# Patient Record
Sex: Male | Born: 1960
Health system: Southern US, Community
[De-identification: ages and names within clinical notes are randomized; demographics above are authoritative.]

## PROBLEM LIST (undated history)

## (undated) ENCOUNTER — Ambulatory Visit

## (undated) DIAGNOSIS — M1990 Unspecified osteoarthritis, unspecified site: Secondary | ICD-10-CM

## (undated) DIAGNOSIS — E785 Hyperlipidemia, unspecified: Secondary | ICD-10-CM

## (undated) DIAGNOSIS — I2510 Atherosclerotic heart disease of native coronary artery without angina pectoris: Secondary | ICD-10-CM

## (undated) DIAGNOSIS — E079 Disorder of thyroid, unspecified: Secondary | ICD-10-CM

## (undated) DIAGNOSIS — C439 Malignant melanoma of skin, unspecified: Secondary | ICD-10-CM

## (undated) DIAGNOSIS — I10 Essential (primary) hypertension: Secondary | ICD-10-CM

## (undated) DIAGNOSIS — E119 Type 2 diabetes mellitus without complications: Secondary | ICD-10-CM

## (undated) HISTORY — PX: KNEE SURGERY: SHX244

## (undated) HISTORY — DX: Type 2 diabetes mellitus without complications: E11.9

## (undated) HISTORY — DX: Unspecified osteoarthritis, unspecified site: M19.90

## (undated) HISTORY — DX: Atherosclerotic heart disease of native coronary artery without angina pectoris: I25.10

## (undated) HISTORY — DX: Hyperlipidemia, unspecified: E78.5

## (undated) HISTORY — DX: Malignant melanoma of skin, unspecified: C43.9

## (undated) HISTORY — DX: Disorder of thyroid, unspecified: E07.9

## (undated) HISTORY — PX: FRACTURE SURGERY: SHX138

---

## 2002-01-10 ENCOUNTER — Encounter: Admission: RE | Admit: 2002-01-10 | Discharge: 2002-04-10 | Payer: Self-pay | Admitting: Family Medicine

## 2005-02-27 ENCOUNTER — Ambulatory Visit: Payer: Self-pay | Admitting: Family Medicine

## 2005-04-04 ENCOUNTER — Ambulatory Visit: Payer: Self-pay | Admitting: Family Medicine

## 2005-04-08 ENCOUNTER — Ambulatory Visit: Payer: Self-pay | Admitting: Internal Medicine

## 2005-04-21 ENCOUNTER — Ambulatory Visit: Payer: Self-pay | Admitting: Family Medicine

## 2005-05-16 ENCOUNTER — Ambulatory Visit: Payer: Self-pay | Admitting: Family Medicine

## 2005-05-26 ENCOUNTER — Ambulatory Visit: Payer: Self-pay | Admitting: Cardiology

## 2005-06-02 ENCOUNTER — Ambulatory Visit: Payer: Self-pay

## 2005-07-07 ENCOUNTER — Ambulatory Visit: Payer: Self-pay | Admitting: Cardiology

## 2005-07-31 ENCOUNTER — Ambulatory Visit: Payer: Self-pay | Admitting: Family Medicine

## 2005-08-04 ENCOUNTER — Ambulatory Visit: Payer: Self-pay | Admitting: Family Medicine

## 2005-08-14 ENCOUNTER — Ambulatory Visit: Payer: Self-pay | Admitting: Family Medicine

## 2005-09-11 ENCOUNTER — Ambulatory Visit: Payer: Self-pay | Admitting: Cardiology

## 2005-09-15 ENCOUNTER — Ambulatory Visit: Payer: Self-pay | Admitting: Cardiology

## 2005-10-20 ENCOUNTER — Ambulatory Visit: Payer: Self-pay | Admitting: Family Medicine

## 2005-10-27 ENCOUNTER — Ambulatory Visit: Payer: Self-pay | Admitting: Cardiology

## 2005-10-30 ENCOUNTER — Encounter: Admission: RE | Admit: 2005-10-30 | Discharge: 2005-10-30 | Payer: Self-pay | Admitting: Family Medicine

## 2005-12-08 ENCOUNTER — Ambulatory Visit: Payer: Self-pay | Admitting: Family Medicine

## 2005-12-15 ENCOUNTER — Ambulatory Visit: Payer: Self-pay | Admitting: Cardiology

## 2007-05-10 DIAGNOSIS — E039 Hypothyroidism, unspecified: Secondary | ICD-10-CM

## 2007-05-10 DIAGNOSIS — E1169 Type 2 diabetes mellitus with other specified complication: Secondary | ICD-10-CM

## 2007-05-10 DIAGNOSIS — E789 Disorder of lipoprotein metabolism, unspecified: Secondary | ICD-10-CM | POA: Insufficient documentation

## 2007-05-10 DIAGNOSIS — E785 Hyperlipidemia, unspecified: Secondary | ICD-10-CM

## 2007-05-13 ENCOUNTER — Ambulatory Visit: Payer: Self-pay | Admitting: Family Medicine

## 2007-05-13 DIAGNOSIS — E119 Type 2 diabetes mellitus without complications: Secondary | ICD-10-CM

## 2007-05-14 ENCOUNTER — Ambulatory Visit: Payer: Self-pay | Admitting: Family Medicine

## 2007-05-14 LAB — CONVERTED CEMR LAB
Ketones, urine, test strip: NEGATIVE
Nitrite: NEGATIVE
Specific Gravity, Urine: 1.02
Urobilinogen, UA: NEGATIVE

## 2007-05-19 LAB — CONVERTED CEMR LAB
ALT: 37 units/L (ref 0–53)
AST: 26 units/L (ref 0–37)
Albumin: 4.1 g/dL (ref 3.5–5.2)
Alkaline Phosphatase: 66 units/L (ref 39–117)
BUN: 12 mg/dL (ref 6–23)
CO2: 30 meq/L (ref 19–32)
Calcium: 9.9 mg/dL (ref 8.4–10.5)
Chloride: 105 meq/L (ref 96–112)
Creatinine, Ser: 1 mg/dL (ref 0.4–1.5)
Direct LDL: 228.7 mg/dL
HDL: 38.8 mg/dL — ABNORMAL LOW (ref >39.0)
Microalb, Ur: 6.3 mg/dL — ABNORMAL HIGH (ref 0.0–1.9)
Potassium: 4.1 meq/L (ref 3.5–5.1)
Total Bilirubin: 0.7 mg/dL (ref 0.3–1.2)
Total Protein: 7.3 g/dL (ref 6.0–8.3)
Triglycerides: 464 mg/dL (ref 0–149)
VLDL: 93 mg/dL — ABNORMAL HIGH (ref 0–40)

## 2007-09-16 ENCOUNTER — Ambulatory Visit: Payer: Self-pay | Admitting: Family Medicine

## 2007-09-17 LAB — CONVERTED CEMR LAB
Chloride: 106 meq/L (ref 96–112)
Cholesterol: 230 mg/dL (ref 0–200)
Creatinine,U: 170.9 mg/dL
Direct LDL: 166.1 mg/dL
GFR calc non Af Amer: 69 mL/min
Glucose, Bld: 106 mg/dL — ABNORMAL HIGH (ref 70–99)
HDL: 28 mg/dL — ABNORMAL LOW (ref >39.0)
Hgb A1c MFr Bld: 5.8 % (ref 4.6–6.0)
Potassium: 4 meq/L (ref 3.5–5.1)
Sodium: 142 meq/L (ref 135–145)
Total CHOL/HDL Ratio: 8.2
Triglycerides: 237 mg/dL (ref 0–149)
VLDL: 47 mg/dL — ABNORMAL HIGH (ref 0–40)

## 2007-09-23 ENCOUNTER — Ambulatory Visit: Payer: Self-pay | Admitting: Family Medicine

## 2007-09-23 DIAGNOSIS — D2330 Other benign neoplasm of skin of unspecified part of face: Secondary | ICD-10-CM

## 2007-09-23 DIAGNOSIS — J309 Allergic rhinitis, unspecified: Secondary | ICD-10-CM

## 2007-11-24 ENCOUNTER — Ambulatory Visit: Payer: Self-pay | Admitting: Family Medicine

## 2007-11-26 LAB — CONVERTED CEMR LAB
BUN: 19 mg/dL (ref 6–23)
Bilirubin, Direct: 0.1 mg/dL (ref 0.0–0.3)
CO2: 27 meq/L (ref 19–32)
Direct LDL: 177 mg/dL
GFR calc Af Amer: 70 mL/min
GFR calc non Af Amer: 58 mL/min
Glucose, Bld: 124 mg/dL — ABNORMAL HIGH (ref 70–99)
HDL: 26.3 mg/dL — ABNORMAL LOW (ref >39.0)
Potassium: 3.9 meq/L (ref 3.5–5.1)
Total CHOL/HDL Ratio: 9.3
Total Protein: 7.1 g/dL (ref 6.0–8.3)
Triglycerides: 269 mg/dL (ref 0–149)

## 2007-11-29 ENCOUNTER — Telehealth (INDEPENDENT_AMBULATORY_CARE_PROVIDER_SITE_OTHER): Payer: Self-pay | Admitting: *Deleted

## 2007-12-16 ENCOUNTER — Telehealth: Payer: Self-pay | Admitting: Family Medicine

## 2008-05-29 ENCOUNTER — Telehealth (INDEPENDENT_AMBULATORY_CARE_PROVIDER_SITE_OTHER): Payer: Self-pay | Admitting: *Deleted

## 2008-08-10 ENCOUNTER — Telehealth (INDEPENDENT_AMBULATORY_CARE_PROVIDER_SITE_OTHER): Payer: Self-pay | Admitting: *Deleted

## 2008-08-14 ENCOUNTER — Ambulatory Visit: Payer: Self-pay | Admitting: Family Medicine

## 2008-08-14 LAB — CONVERTED CEMR LAB
Bilirubin Urine: NEGATIVE
Blood in Urine, dipstick: NEGATIVE
Glucose, Urine, Semiquant: >=1000
Nitrite: NEGATIVE
Protein, U semiquant: NEGATIVE
Specific Gravity, Urine: 1.01
Urobilinogen, UA: 0.2
WBC Urine, dipstick: NEGATIVE
pH: 5

## 2008-08-21 ENCOUNTER — Ambulatory Visit: Payer: Self-pay | Admitting: Family Medicine

## 2008-08-21 DIAGNOSIS — R030 Elevated blood-pressure reading, without diagnosis of hypertension: Secondary | ICD-10-CM | POA: Insufficient documentation

## 2008-08-21 LAB — CONVERTED CEMR LAB
ALT: 39 units/L (ref 0–53)
AST: 30 units/L (ref 0–37)
Albumin: 4.1 g/dL (ref 3.5–5.2)
Alkaline Phosphatase: 69 units/L (ref 39–117)
BUN: 15 mg/dL (ref 6–23)
CO2: 26 meq/L (ref 19–32)
Chloride: 105 meq/L (ref 96–112)
GFR calc non Af Amer: 85 mL/min
Glucose, Bld: 350 mg/dL — ABNORMAL HIGH (ref 70–99)
Potassium: 4.3 meq/L (ref 3.5–5.1)
Total Protein: 6.3 g/dL (ref 6.0–8.3)

## 2008-08-29 ENCOUNTER — Encounter (INDEPENDENT_AMBULATORY_CARE_PROVIDER_SITE_OTHER): Payer: Self-pay | Admitting: *Deleted

## 2008-08-31 ENCOUNTER — Ambulatory Visit: Payer: Self-pay | Admitting: Family Medicine

## 2008-09-01 ENCOUNTER — Encounter (INDEPENDENT_AMBULATORY_CARE_PROVIDER_SITE_OTHER): Payer: Self-pay | Admitting: *Deleted

## 2008-09-11 ENCOUNTER — Encounter: Payer: Self-pay | Admitting: Family Medicine

## 2008-09-12 ENCOUNTER — Ambulatory Visit: Payer: Self-pay | Admitting: Family Medicine

## 2008-09-12 DIAGNOSIS — I1A0 Resistant hypertension: Secondary | ICD-10-CM | POA: Insufficient documentation

## 2008-09-12 DIAGNOSIS — R079 Chest pain, unspecified: Secondary | ICD-10-CM

## 2008-09-12 DIAGNOSIS — E1159 Type 2 diabetes mellitus with other circulatory complications: Secondary | ICD-10-CM

## 2008-09-12 DIAGNOSIS — I10 Essential (primary) hypertension: Secondary | ICD-10-CM

## 2008-09-15 ENCOUNTER — Encounter (INDEPENDENT_AMBULATORY_CARE_PROVIDER_SITE_OTHER): Payer: Self-pay | Admitting: *Deleted

## 2008-09-19 ENCOUNTER — Ambulatory Visit: Payer: Self-pay | Admitting: Cardiovascular Disease

## 2008-09-19 ENCOUNTER — Telehealth (INDEPENDENT_AMBULATORY_CARE_PROVIDER_SITE_OTHER): Payer: Self-pay | Admitting: *Deleted

## 2008-09-29 ENCOUNTER — Ambulatory Visit: Payer: Self-pay | Admitting: Cardiovascular Disease

## 2008-09-29 ENCOUNTER — Encounter: Payer: Self-pay | Admitting: Family Medicine

## 2008-09-29 ENCOUNTER — Ambulatory Visit: Payer: Self-pay

## 2008-09-29 ENCOUNTER — Encounter: Payer: Self-pay | Admitting: Cardiovascular Disease

## 2008-11-14 ENCOUNTER — Ambulatory Visit: Payer: Self-pay | Admitting: Family Medicine

## 2008-11-14 DIAGNOSIS — M25569 Pain in unspecified knee: Secondary | ICD-10-CM

## 2008-11-23 ENCOUNTER — Encounter: Payer: Self-pay | Admitting: Family Medicine

## 2008-11-29 ENCOUNTER — Telehealth (INDEPENDENT_AMBULATORY_CARE_PROVIDER_SITE_OTHER): Payer: Self-pay | Admitting: *Deleted

## 2008-12-05 ENCOUNTER — Encounter: Payer: Self-pay | Admitting: Family Medicine

## 2008-12-13 ENCOUNTER — Ambulatory Visit: Payer: Self-pay | Admitting: Family Medicine

## 2008-12-29 ENCOUNTER — Telehealth (INDEPENDENT_AMBULATORY_CARE_PROVIDER_SITE_OTHER): Payer: Self-pay | Admitting: *Deleted

## 2008-12-29 LAB — CONVERTED CEMR LAB
AST: 30 units/L (ref 0–37)
Alkaline Phosphatase: 46 units/L (ref 39–117)
BUN: 16 mg/dL (ref 6–23)
Chloride: 106 meq/L (ref 96–112)
Direct LDL: 172.1 mg/dL
GFR calc non Af Amer: 76 mL/min
Hgb A1c MFr Bld: 6.5 % — ABNORMAL HIGH (ref 4.6–6.0)
Potassium: 4.2 meq/L (ref 3.5–5.1)
Total Bilirubin: 0.8 mg/dL (ref 0.3–1.2)
VLDL: 51 mg/dL — ABNORMAL HIGH (ref 0–40)

## 2009-01-01 ENCOUNTER — Encounter (INDEPENDENT_AMBULATORY_CARE_PROVIDER_SITE_OTHER): Payer: Self-pay | Admitting: *Deleted

## 2009-02-21 ENCOUNTER — Ambulatory Visit (HOSPITAL_BASED_OUTPATIENT_CLINIC_OR_DEPARTMENT_OTHER): Admission: RE | Admit: 2009-02-21 | Discharge: 2009-02-21 | Payer: Self-pay | Admitting: Orthopedic Surgery

## 2009-10-03 ENCOUNTER — Telehealth: Payer: Self-pay | Admitting: Family Medicine

## 2009-10-18 ENCOUNTER — Telehealth (INDEPENDENT_AMBULATORY_CARE_PROVIDER_SITE_OTHER): Payer: Self-pay | Admitting: *Deleted

## 2009-10-19 ENCOUNTER — Encounter: Payer: Self-pay | Admitting: Family Medicine

## 2009-10-29 ENCOUNTER — Ambulatory Visit: Payer: Self-pay | Admitting: Family Medicine

## 2009-10-31 ENCOUNTER — Telehealth: Payer: Self-pay | Admitting: Family Medicine

## 2009-10-31 LAB — CONVERTED CEMR LAB
Albumin: 4.1 g/dL (ref 3.5–5.2)
BUN: 10 mg/dL (ref 6–23)
Calcium: 9.7 mg/dL (ref 8.4–10.5)
Creatinine,U: 138 mg/dL
GFR calc non Af Amer: 84.19 mL/min (ref >60)
Glucose, Bld: 279 mg/dL — ABNORMAL HIGH (ref 70–99)
HDL: 33.6 mg/dL — ABNORMAL LOW (ref >39.00)
Microalb, Ur: 1.9 mg/dL (ref 0.0–1.9)
TSH: 0.28 microintl units/mL — ABNORMAL LOW (ref 0.35–5.50)
Triglycerides: 584 mg/dL — ABNORMAL HIGH (ref 0.0–149.0)

## 2009-11-02 ENCOUNTER — Ambulatory Visit: Payer: Self-pay | Admitting: Family Medicine

## 2009-11-07 ENCOUNTER — Telehealth: Payer: Self-pay | Admitting: Family Medicine

## 2009-12-17 ENCOUNTER — Encounter: Payer: Self-pay | Admitting: Family Medicine

## 2010-01-08 ENCOUNTER — Telehealth: Payer: Self-pay | Admitting: Family Medicine

## 2010-01-30 ENCOUNTER — Encounter: Payer: Self-pay | Admitting: Family Medicine

## 2010-12-15 LAB — CONVERTED CEMR LAB
AST: 27 units/L (ref 0–37)
Albumin: 4.1 g/dL (ref 3.5–5.2)
Alkaline Phosphatase: 62 units/L (ref 39–117)
Glucose, Bld: 235 mg/dL

## 2010-12-17 NOTE — Progress Notes (Signed)
Summary: Med questions  Phone Note Call from Patient   Caller: Patient Summary of Call: Pt called and stated that the Levitra 20mg  is half the price for him, will you send in a new rx to [facility] on [address]. Please advise. Army Fossa CMA  January 08, 2010 4:22 PM   Follow-up for Phone Call        levitra 20 mg  #10  2 refills ---as directed I accidently sent it to wrong pharmacy--- [facility]?  sorry Follow-up by: Loreen Freud DO,  January 08, 2010 4:35 PM  Additional Follow-up for Phone Call Additional follow up Details #1::        Pt is aware sent to pharm. Called [facility] and cancelled rx there. Army Fossa CMA  January 08, 2010 4:46 PM     New/Updated Medications: LEVITRA 20 MG TABS (VARDENAFIL HCL) as directed Prescriptions: LEVITRA 20 MG TABS (VARDENAFIL HCL) as directed  #10 x 2   Entered and Authorized by:   Loreen Freud DO   Signed by:   Loreen Freud DO on 01/08/2010   Method used:   Electronically to        [facility] [address] (retail)       [phone]       [place], [place]  [address]       Ph: [phone]       Fax: [phone]   RxID:   [id number] LEVITRA 20 MG TABS (VARDENAFIL HCL) as directed  #10 x 2   Entered and Authorized by:   Loreen Freud DO   Signed by:   Loreen Freud DO on 01/08/2010   Method used:   [facility] to        [facility] [phone][facility] (retail)       [address]       [place], [place]  [address]       Ph: [phone] or [phone]       Fax: [phone]   RxID:   [id number]

## 2010-12-17 NOTE — Consult Note (Signed)
Summary: [facility]   Imported By: Lanelle Bal 01/16/2010 13:49:44  _____________________________________________________________________  External Attachment:    Type:   Image     Comment:   External Document

## 2010-12-17 NOTE — Letter (Signed)
Summary: [facility]   Imported By: Lanelle Bal 03/05/2010 08:01:23  _____________________________________________________________________  External Attachment:    Type:   Image     Comment:   External Document

## 2011-02-26 LAB — POCT I-STAT 4, (NA,K, GLUC, HGB,HCT)
Hemoglobin: 15.6 g/dL (ref 13.0–17.0)
Potassium: 4.4 mEq/L (ref 3.5–5.1)

## 2011-02-26 LAB — GLUCOSE, CAPILLARY: Glucose-Capillary: 81 mg/dL (ref 70–99)

## 2011-04-01 NOTE — Assessment & Plan Note (Signed)
[facility]                            CARDIOLOGY OFFICE NOTE   MAXON, KRESSE                        MRN:          [id number]  DATE:09/29/2008                            DOB:          April 30, 1961    PRIMARY CARE PHYSICIAN:  Lelon Perla, DO   REASON FOR CONSULTATION:  Followup of cardiac studies after initial  visit with complaints of chest pain on September 19, 2008.   HISTORY OF PRESENT ILLNESS:  Mr. Lovelady is a pleasant 50 year old  Caucasian male with a past medical history significant for hypertension,  hyperlipidemia, diabetes mellitus, obesity, hypothyroidism,  gastroesophageal reflux disease, tobacco abuse, and a family history of  coronary artery disease who presented to my office on September 19, 2008,  as a referral from Dr. Laury Axon for further evaluation of chest pain.  The  patient describes substernal chest pain without radiation that felt like  a pressure sensation and was not associated with shortness of breath,  diaphoresis, palpitations, or nausea.  The pain lasted for 30 minutes to  several hours.  He told me that the pain usually occur when he was lying  in bed at night after eating large meals.  The pain usually got better  when he got up and walked around.  There are many atypical features to  his pain, however, given his risk factors and his family history, I  elected to pursue nuclear exercise stress test as well as a surface  echocardiogram.  The patient had these test performed today.  Unfortunately, the echocardiogram is not available for review at this  time.  I did review his nuclear stress test and it shows no signs of  ischemia.  He also had no EKG evidence of ischemia during the stress  portion of the test.  He did have hypertensive blood pressure response  with pressure of 220/100 at peak exercise.  He did not have any chest  pain during the exercise portion of the test.   He tells me that since his visit here 10  days ago, he has had no  recurrence of his chest pain.  He was started on proton pump inhibitor  by his primary care physician Dr. Laury Axon and feels that this has solved  his problem of chest pain.  He also denies any orthopnea, PND, lower  extremity edema, dizziness, near syncope, or syncope.   PAST MEDICAL HISTORY:  Unchanged and is described in detail above.   REVIEW OF SYSTEMS:  As described above and otherwise negative.   PHYSICAL EXAMINATION:  VITAL SIGNS:  Blood pressure 122/72, pulse 72 and  regular, respirations 12 and nonlabored.  GENERAL:  He is a pleasant young Caucasian male in no acute distress.  He is alert and oriented x3.  PSYCHIATRIC:  Mood and affect are normal.  MUSCULOSKELETAL:  Muscle strength and tone are normal.  NECK:  No JVD.  No carotid bruits.  No lymphadenopathy.  No thyromegaly.  LUNGS:  Clear to auscultation bilaterally without wheezes, rhonchi, or  crackles noted.  CARDIOVASCULAR:  Regular rate and rhythm without murmurs, gallops,  or  rubs noted.  ABDOMEN:  Soft, nontender, and nondistended.  Bowel sounds are present.  EXTREMITIES:  No evidence of edema.  Pulses are 2+ in all extremities.   DIAGNOSTIC STUDIES:  1. Exercise Myoview shows that the patient exercised for 8 minutes 15      seconds and achieved a peak heart rate of 141, which was 82% of      maximum predicted heart rate for age.  He achieved 10.1 metabolic      equivalents during the study.  He did complain of dyspnea, however,      he had no chest pain.  The test was stopped secondary to the      patient's increased blood pressure response with a pressure of      220/100 with maximum exercise.  There were no EKG changes that were      suggestive of ischemia during the study.  The perfusion images      demonstrated no evidence of ischemia.  Ejection fraction was      estimated at 59%.   ASSESSMENT AND PLAN:  This is a pleasant 50 year old Caucasian male with  multiple risk factors for  coronary artery disease as described above,  who had atypical substernal chest pain that has resolved since the  patient was started on a proton pump inhibitor.  I preliminarily read  his nuclear stress study today, which showed no evidence of ischemia.  The formal reading of this study will be available on Monday.  The  patient also had an echocardiogram, which has not been read at this  time.  I told the patient we will let him know on Monday with what  results of his echocardiogram and the official results of his nuclear  stress study.  In the meantime, I have encouraged him to start taking  the lisinopril 10 mg once daily that was written by Dr. Laury Axon several  weeks ago.  The patient understands that he should begin this  medication.  He has been demonstrated during this exercise test as well  as one 3 years ago to have hypertensive response to exercise.  If the  patient's echocardiogram is normal as I suspect it will be, we will have  no reason to see him back in this office.  I have encouraged him to  continue to follow with Dr. Laury Axon as necessary for his primary care  needs.     Verne Carrow, MD  Electronically Signed    CM/MedQ  DD: 09/29/2008  DT: 09/30/2008  Job #: [id number]

## 2011-04-01 NOTE — Op Note (Signed)
[name], [name]                 ACCOUNT NO.:  [id number]   MEDICAL RECORD NO.:  [id number]          PATIENT TYPE:  AMB   LOCATION:  [facility]                         FACILITY:  [facility]   PHYSICIAN:  Ollen Gross, M.D.    DATE OF BIRTH:  1961/07/04   DATE OF PROCEDURE:  02/21/2009  DATE OF DISCHARGE:                               OPERATIVE REPORT   PREOPERATIVE DIAGNOSIS:  Right knee medial meniscal tear.   POSTOPERATIVE DIAGNOSIS:  Right knee medial meniscal tear, chondral  defect medial.   PROCEDURE:  Right knee arthroscopy with medial meniscal debridement and  chondroplasty.   SURGEON:  Ollen Gross, M.D., no assistant.   ANESTHESIA:  General.   BLOOD LOSS:  Minimal.   DRAINS:  None.   COMPLICATIONS:  None.   CONDITION:  Stable to recovery.   BRIEF CLINICAL NOTE:  Frederick Robinson is a 50 year old male with a several-month  history of significant right knee pain and mechanical symptoms.  Exam  and history suggested a meniscal tear confirmed by MRI.  He presents now  for arthroscopy and debridement.   PROCEDURE IN DETAIL:  After successful administration of general  anesthetic, a tourniquet placed on the right thigh and right lower  extremity prepped and draped in the usual sterile fashion.  Standard  superomedial inferolateral incisions made.  Inflow cannula passed  superomedial and camera passed inferolateral.  Arthroscopic  visualization proceeds.  Undersurface of patella had some grade 2  changes but no unstable cartilage.  The trochlea superolaterally does  have a small area of unstable cartilage.  The rest the trochlea just had  some grade 2 chondromalacia.  The mediolateral gutters were visualized.  There was a small loose body laterally.  The flexion and valgus force  applied to the knee and the medial compartment entered.  Spinal needle  is used to localize the inferomedial portal.  Small incision was made,  dilator placed.  Medial compartment shows that there is a large  chondral  flap on the weightbearing surface of the medial femoral condyle.  It is  about 1 x 2 cm.  There is also some surrounding chondromalacia.  Under  the flap was exposed bone.  Also a pretty significant tear in the body  and posterior horn of the medial meniscus.  The meniscus was debrided  back to stable base with baskets and a 4.2 mm shaver and sealed off with  the ArthroCare.  The chondral defect is debrided back to a stable bony  base with stable cartilaginous edges.  I abraded the bone to do an  abrasion chondroplasty and hopefully be able to restore some  fibrocartilage.  The rest of the medial compartment looks fine.  Intercondylar notch was visualized.  The ACL looks normal.  Lateral  compartment was entered and it looks normal.  The rest of the joint was  again inspected and that loose body from lateral gutter was removed.  The unstable cartilage on the superolateral trochlea is then debrided  back to a stable bony base with stable cartilaginous edges.  A very  small defect about 1 x  1 cm.  The arthroscopic equipment was  then removed from the inferior portals which were closed with  interrupted 4-0 nylon.  20 mL of 0.25% Marcaine with epi injected  through the inflow cannula and that is removed and that portal closed  with nylon.  A bulky sterile dressing is applied.  He is awakened and  transferred to recovery in stable condition.      Ollen Gross, M.D.  Electronically Signed     FA/MEDQ  D:  02/21/2009  T:  02/21/2009  Job:  [id number]

## 2011-04-01 NOTE — Assessment & Plan Note (Signed)
[facility]                            CARDIOLOGY OFFICE NOTE   Frederick Robinson, Frederick Robinson                        MRN:          [id number]  DATE:09/19/2008                            DOB:          [birth year]    PRIMARY CARE PHYSICIAN:  Lelon Perla, DO.   REASON FOR CONSULTATION:  Chest pain.   HISTORY OF PRESENT ILLNESS:  Frederick Robinson is a pleasant 50 year old  Caucasian male with a past medical history significant for hypertension,  hyperlipidemia, diabetes mellitus, obesity, hypothyroidism,  gastroesophageal reflux disease, tobacco abuse and a family history of  coronary artery disease, who presents today as a referral from his  primary care physician with complaints of chest pain.  The patient tells  me that approximately 4 weeks ago, he began to notice substernal chest  pressure.  His pain was located in the center of his chest and did not  radiate.  It was described as a pressure sensation that was not  associated with shortness of breath, diaphoresis, palpitations or  nausea.  The pain lasted for 30 minutes to several hours.  The pain  always occurred while lying in bed at night after eating.  He tells me  that the pain usually got better when he got up and walked around.  He  has had no chest pain while walking [place].  He was started  on a proton-pump inhibitor by his primary care physician and has had  some improvement in his chest pain, although he has still noticed the  presence of some pressure in the substernal area.  He denies any  palpitations, dizziness, near syncope, syncope, orthopnea, PND or lower  extremity edema.  He is an active individual, but does not exercise on a  regular basis.   PAST MEDICAL HISTORY:  1. Hypertension.  2. Hyperlipidemia.  3. Gastroesophageal reflux disease.  4. Hypothyroidism.  5. Diabetes mellitus.  6. History of tobacco use.   PAST SURGICAL HISTORY:  Tonsillectomy.   ALLERGIES:  PRAVACHOL,  which causes hives.   CURRENT MEDICATIONS:  1. Amaryl 4 mg twice daily.  2. Enteric-coated aspirin 81 mg once daily.  3. Synthroid 275 mcg once daily.  4. TriCor 145 mg once daily.  5. Crestor 20 mg one-half tablet once daily.  6. Humalog insulin as directed.  7. Actos 30 mg once daily.  8. Zetia 10 mg once daily.  9. Lisinopril 10 mg once daily (the patient has not start taking this      medication yet).  10.Omeprazole 20 mg once daily.  11.Singulair 10 mg once daily.   SOCIAL HISTORY:  The patient smoked one pack of cigarettes per day for  20 years; however, he stopped smoking cigarettes 10 years ago.  Over the  last 10 years, he has occasionally smoked cigars, but has not smoked any  tobacco in the last 2 years.  He admits to social alcohol use, but  denies the use of illicit drugs.  He is married and has no children.  He  is employed as an Acupuncturist.  FAMILY HISTORY:  The patient's mother is alive and has coronary artery  disease.  The patient's father is alive and has coronary artery disease.  He has 2 younger brothers who have no premature coronary artery disease.   REVIEW OF SYSTEMS:  As stated in the history of present illness and is  otherwise negative.   PHYSICAL EXAMINATION:  VITAL SIGNS:  Blood pressure 160/82, pulse 61 and  regular, respirations 12 and nonlabored.  Weight 265 pounds.  GENERAL:  He is a young Caucasian male, in no acute distress.  He is  alert and oriented x3.  HEENT:  Normal.  NECK:  No JVD.  No carotid bruits.  No lymphadenopathy.  No thyromegaly.  LUNGS:  Clear to auscultation bilaterally without wheezes, rhonchi, or  crackles noted.  CARDIOVASCULAR:  Regular rate and rhythm without murmurs, gallops, or  rubs noted.  ABDOMEN:  Soft and nontender.  Bowel sounds are present.  EXTREMITIES:  No evidence of edema.  Pulses are 2+ in all extremities.  PSYCHIATRIC:  Mood and affect are appropriate.  MUSCULOSKELETAL:  Muscle strength and  tone is normal.  NEUROLOGIC:  No focal neurological deficits.  SKIN:  Warm and dry.   DIAGNOSTIC STUDIES:  A 12-lead EKG shows normal sinus rhythm with a  ventricular rate of 61 beats per minute.  There is evidence of left  ventricular hypertrophy.  There are no other abnormalities noted.   ASSESSMENT AND PLAN:  This is a pleasant 50 year old Caucasian male with  multiple risk factors for coronary artery disease including  hypertension, hyperlipidemia, diabetes mellitus, history of tobacco  abuse, obesity, and a family history of coronary artery disease, who  presents with complaints of substernal chest pressure over the last 4  weeks.  The pain is somewhat atypical in nature and has been occurring  mostly while lying in bed at night.  The pain seems to get better with  activity.  The pain has been lasting for several hours.  Even though,  the pain has atypical features, I would like to pursue an ischemic  workup, given the patient's multiple cardiac risk factors.  We will  start by performing a cardiac echocardiogram, given the EKG  abnormalities of left ventricular hypertrophy.  I would also like to  perform and exercise Myoview stress test to rule out any myocardial  ischemia.  The patient will have these studies performed here in our  office next week and will follow up in this clinic in several weeks to  get the results of his test.  I  will not make any medication changes today.  The patient is aware he  should let us know if he has any change in his symptoms prior to his  next visit in this office.     Verne Carrow, MD  Electronically Signed    CM/MedQ  DD: 09/19/2008  DT: 09/20/2008  Job #: [id number]   cc:   Lelon Perla, DO

## 2013-06-24 ENCOUNTER — Ambulatory Visit: Payer: BC Managed Care – PPO | Admitting: Family Medicine

## 2013-07-12 ENCOUNTER — Ambulatory Visit (INDEPENDENT_AMBULATORY_CARE_PROVIDER_SITE_OTHER): Payer: BC Managed Care – PPO

## 2013-07-12 ENCOUNTER — Ambulatory Visit (INDEPENDENT_AMBULATORY_CARE_PROVIDER_SITE_OTHER): Payer: BC Managed Care – PPO | Admitting: Family Medicine

## 2013-07-12 ENCOUNTER — Encounter: Payer: Self-pay | Admitting: Family Medicine

## 2013-07-12 VITALS — BP 131/75 | HR 67 | Temp 98.7°F | Ht 74.5 in | Wt 234.2 lb

## 2013-07-12 DIAGNOSIS — E039 Hypothyroidism, unspecified: Secondary | ICD-10-CM

## 2013-07-12 DIAGNOSIS — Z0000 Encounter for general adult medical examination without abnormal findings: Secondary | ICD-10-CM

## 2013-07-12 DIAGNOSIS — Z87891 Personal history of nicotine dependence: Secondary | ICD-10-CM | POA: Insufficient documentation

## 2013-07-12 DIAGNOSIS — N529 Male erectile dysfunction, unspecified: Secondary | ICD-10-CM

## 2013-07-12 DIAGNOSIS — E119 Type 2 diabetes mellitus without complications: Secondary | ICD-10-CM

## 2013-07-12 DIAGNOSIS — I10 Essential (primary) hypertension: Secondary | ICD-10-CM

## 2013-07-12 MED ORDER — SILDENAFIL CITRATE 20 MG PO TABS: 100.0000 mg | ORAL_TABLET | Freq: Every day | ORAL | Status: DC | PRN

## 2013-07-12 NOTE — Progress Notes (Signed)
Patient ID: Frederick Robinson, male   DOB: [birth year], 52 y.o.   MRN: [id number] SUBJECTIVE: CC: Chief Complaint  Patient presents with  . Annual Exam     discuss labs     HPI: Patient is here for follow up of Diabetes Mellitus: Symptoms evaluated: Denies Nocturia ,Denies Urinary Frequency , denies Blurred vision ,deniesDizziness,denies.Dysuria,denies paresthesias, denies extremity pain or ulcers.Marland Kitchendenies chest pain. has had an annual eye exam. do check the feet. Does check CBGs. Average NWG:NFAO Denies episodes of hypoglycemia. Does have an emergency hypoglycemic plan. admits toCompliance with medications. Denies Problems with medications.  Occupation: Art gallery manager: Advertising account planner.  Breakfast: banana and a cup of coffee. Lunch: salmon, sauteed vegetables, Supper: leftovers.  Past Medical History  Diagnosis Date  . Diabetes mellitus without complication   . Hyperlipidemia    Past Surgical History  Procedure Laterality Date  . Knee surgery Right    History   Social History  . Marital Status: Married    Spouse Name: N/A    Number of Children: N/A  . Years of Education: N/A   Occupational History  . Not on file.   Social History Main Topics  . Smoking status: Former Smoker    Quit date: 07/12/2009  . Smokeless tobacco: Not on file  . Alcohol Use: Not on file  . Drug Use: Not on file  . Sexual Activity: Not on file   Other Topics Concern  . Not on file   Social History Narrative  . No narrative on file   Family History  Problem Relation Age of Onset  . Cancer Mother   . Cancer Father     liver cancer  . Diabetes Father    No current outpatient prescriptions on file prior to visit.   No current facility-administered medications on file prior to visit.   Allergies  Allergen Reactions  . Pravastatin Sodium     REACTION: HIVES   Immunization History  Administered Date(s) Administered  . Influenza Whole 08/21/2008, 08/28/2009, 08/29/2009  . Td 11/01/2008    Prior to Admission medications   Medication Sig Start Date End Date Taking? Authorizing Provider  aspirin 81 MG tablet Take 81 mg by mouth daily.   Yes Historical Provider, MD  atorvastatin (LIPITOR) 20 MG tablet Take 20 mg by mouth daily.   Yes Historical Provider, MD  Canagliflozin (INVOKANA) 100 MG TABS Take 1 tablet by mouth daily.   Yes Historical Provider, MD  fenofibrate (TRICOR) 145 MG tablet Take 145 mg by mouth daily.  07/03/13  Yes Historical Provider, MD  glimepiride (AMARYL) 4 MG tablet  07/03/13  Yes Historical Provider, MD  ibuprofen (ADVIL,MOTRIN) 400 MG tablet Take 400 mg by mouth every 6 (six) hours as needed for pain.   Yes Historical Provider, MD  lisinopril (PRINIVIL,ZESTRIL) 5 MG tablet Take 5 mg by mouth daily.   Yes Historical Provider, MD  metFORMIN (GLUCOPHAGE-XR) 500 MG 24 hr tablet Take by mouth. 2 tabs bid 07/03/13  Yes Historical Provider, MD  SYNTHROID 112 MCG tablet Take 112 mcg by mouth daily before breakfast.  07/03/13  Yes Historical Provider, MD  sildenafil (REVATIO) 20 MG tablet Take 5 tablets (100 mg total) by mouth daily as needed. 07/12/13   Ileana Ladd, MD    ROS: As above in the HPI. All other systems are stable or negative.  OBJECTIVE: APPEARANCE:  Patient in no acute distress.The patient appeared well nourished and normally developed. Acyanotic. Waist: VITAL SIGNS:BP 131/75  Pulse 67  Temp(Src) 98.7  F (37.1 C) (Oral)  Ht 6' 2.5" (1.892 m)  Wt 234 lb 3.2 oz (106.232 kg)  BMI 29.68 kg/m2 WM  SKIN: warm and  Dry without overt rashes, tattoos and scars  HEAD and Neck: without JVD, Head and scalp: normal Eyes:No scleral icterus. Fundi normal, eye movements normal. Ears: Auricle normal, canal normal, Tympanic membranes normal, insufflation normal. Nose: normal Throat: normal Neck & thyroid: normal  CHEST & LUNGS: Chest wall: normal Lungs: Clear  CVS: Reveals the PMI to be normally located. Regular rhythm, First and Second Heart  sounds are normal,  absence of murmurs, rubs or gallops. Peripheral vasculature: Radial pulses: normal Dorsal pedis pulses: normal Posterior pulses: normal  ABDOMEN:  Appearance: normal Benign, no organomegaly, no masses, no Abdominal Aortic enlargement. No Guarding , no rebound. No Bruits. Bowel sounds: normal  RECTAL: N/A GU: N/A  EXTREMETIES: nonedematous.  MUSCULOSKELETAL:  Spine: normal Joints: intact  NEUROLOGIC: oriented to time,place and person; nonfocal. Strength is normal Sensory is normal Reflexes are normal Cranial Nerves are normal.  ASSESSMENT: Annual physical exam - Plan: DG Chest 2 View, EKG 12-Lead  DIABETES MELLITUS, TYPE II  HYPERTENSION - Plan: EKG 12-Lead  HYPOTHYROIDISM - Plan: EKG 12-Lead  Former smoker - Plan: DG Chest 2 View  Erectile dysfunction - Plan: sildenafil (REVATIO) 20 MG tablet   PLAN:       HEALTH MAINTENANCE Immunizations: Tetanus-Diphtheria Booster due:2018 Pertusis Booster due:2018 Flu Shot Due: every Fall Pneumonia Vaccine: usually at 53 years of age unless there are certain risk situations. Herpes Zoster/Shingles Vaccine due: usually at 53 years of age HPV RUE:AVWU age 82 to 34 years in males and females.  Healthy Life Habits: Exercise Goal: 5-6 days/week; start gradually(ie 30 minutes/3days per week) Nutrition: Balanced healthy meals including Vegetables and Fruits. Consider  Reading the following books: 1) Eat to Live by Dr Ottis Stain; 2) Prevent and Reverse Heart Disease by Dr Suzzette Righter. 3) Dr Katherina Right Program to reverse Diabetes.  Vitamins:if plant based diet: take a multivitamin and Vit B12 Aspirin: 81 mg Stop Tobacco JWJ:XBJYN smoke Seat Belt Use:+++ recommended Sunscreen Use:+++ recommended  Recommended Screening Tests: Colon Cancer Screening: schedule for GI to plan a colonoscopy Blood work: per Dr Talmage Nap Cholesterol Screening:  As per Dr Talmage Nap          HIV:    ?                 Hepatitis C(people born 47-1966): next labwork   Monthly Self Testicular Exam:+++  Eye Exam: every 1 to 2 years recommended Dental Health: at least every 6 months  Others:    Living Will/Healthcare Power of Attorney: should have this in order with your personal estate planning  Labs from Dr Balan/Endocrinologist were reviewed and  Discussed with the patient. Need for a plant based diet discussed.  Orders Placed This Encounter  Procedures  . DG Chest 2 View    Standing Status: Future     Number of Occurrences: 1     Standing Expiration Date: 09/11/2014    Order Specific Question:  Reason for Exam (SYMPTOM  OR DIAGNOSIS REQUIRED)    Answer:  previous heavy smoker, cough    Order Specific Question:  Preferred imaging location?    Answer:  Internal  . EKG 12-Lead    Meds ordered this encounter  Medications  . glimepiride (AMARYL) 4 MG tablet    Sig:   . SYNTHROID 112 MCG tablet    Sig: Take  112 mcg by mouth daily before breakfast.   . metFORMIN (GLUCOPHAGE-XR) 500 MG 24 hr tablet    Sig: Take by mouth. 2 tabs bid  . fenofibrate (TRICOR) 145 MG tablet    Sig: Take 145 mg by mouth daily.   Marland Kitchen ibuprofen (ADVIL,MOTRIN) 400 MG tablet    Sig: Take 400 mg by mouth every 6 (six) hours as needed for pain.  . Canagliflozin (INVOKANA) 100 MG TABS    Sig: Take 1 tablet by mouth daily.  Marland Kitchen aspirin 81 MG tablet    Sig: Take 81 mg by mouth daily.  Marland Kitchen atorvastatin (LIPITOR) 20 MG tablet    Sig: Take 20 mg by mouth daily.  Marland Kitchen DISCONTD: vardenafil (LEVITRA) 20 MG tablet    Sig: Take 20 mg by mouth daily as needed for erectile dysfunction.  Marland Kitchen lisinopril (PRINIVIL,ZESTRIL) 5 MG tablet    Sig: Take 5 mg by mouth daily.  . sildenafil (REVATIO) 20 MG tablet    Sig: Take 5 tablets (100 mg total) by mouth daily as needed.    Dispense:  200 tablet    Refill:  2   [facility] reading (PRIMARY) by  Dr.Doreen Garretson: no acute findings.  Lifestyle changes reviewed.  Spent 1 hour discussing the need to achieve  better DM control and better health status.  Return in about 4 months (around 11/11/2013) for Recheck medical problems.  Katana Berthold P. [place] Charon, M.D.

## 2013-07-12 NOTE — Patient Instructions (Addendum)
HEALTH MAINTENANCE Immunizations: Tetanus-Diphtheria Booster due:2018 Pertusis Booster due:2018 Flu Shot Due: every Fall Pneumonia Vaccine: usually at 53 years of age unless there are certain risk situations. Herpes Zoster/Shingles Vaccine due: usually at 52 years of age HPV WUJ:WJXB age 80 to 38 years in males and females.  Healthy Life Habits: Exercise Goal: 5-6 days/week; start gradually(ie 30 minutes/3days per week) Nutrition: Balanced healthy meals including Vegetables and Fruits. Consider  Reading the following books: 1) Eat to Live by Dr Ottis Stain; 2) Prevent and Reverse Heart Disease by Dr Suzzette Righter. 3) Dr Katherina Right Program to reverse Diabetes.  Vitamins:if plant based diet: take a multivitamin and Vit B12 Aspirin: 81 mg Stop Tobacco JYN:WGNFA smoke Seat Belt Use:+++ recommended Sunscreen Use:+++ recommended  Recommended Screening Tests: Colon Cancer Screening: schedule for GI to plan a colonoscopy Blood work: per Dr Talmage Nap Cholesterol Screening:  As per Dr Talmage Nap          HIV:    ?                Hepatitis C(people born 71-1966): next labwork   Monthly Self Testicular Exam:+++  Eye Exam: every 1 to 2 years recommended Dental Health: at least every 6 months  Others:    Living Will/Healthcare Power of Attorney: should have this in order with your personal estate planning

## 2013-08-24 ENCOUNTER — Telehealth: Payer: Self-pay | Admitting: Family Medicine

## 2013-08-24 NOTE — Telephone Encounter (Signed)
Pt was seen at Urgent care.  

## 2014-10-11 NOTE — H&P (Signed)
  NTS SOAP Note  Vital Signs:  Vitals as of: 97/35/3299: Systolic 242: Diastolic 683: Heart Rate 84: Temp 98.12F: Height 64ft 3in: Weight 256Lbs 0 Ounces: BMI 32  BMI : 32 kg/m2  Subjective: This 53 year old male presents fora screening TCS.  Denies any gi complaints.  No family h/o colon cancer.  Review of Symptoms:  Constitutional:unremarkable   Head:unremarkable Eyes:unremarkable   Nose/Mouth/Throat:unremarkable Cardiovascular:  unremarkable Respiratory:unremarkable Gastrointestinal:  unremarkable   Genitourinary:unremarkable   back pain Skin:unremarkable Hematolgic/Lymphatic:unremarkable   Allergic/Immunologic:unremarkable   Past Medical History:  Reviewed  Past Medical History  Surgical History: knee surgery Medical Problems: hypothyroidism,  IDDM,  high cholesterol Allergies: pravachol Medications: synthroid,  metformin,  fenobirate,  baby asa,  atorvastatin,  humulog   Social History:Reviewed  Social History  Preferred Language: English Race:  White Ethnicity: Not Hispanic / Latino Age: 53 year Marital Status:  M Alcohol: 5-19 weekly   Smoking Status: Never smoker reviewed on 10/10/2014 Functional Status reviewed on 10/10/2014 ------------------------------------------------ Bathing: Normal Cooking: Normal Dressing: Normal Driving: Normal Eating: Normal Managing Meds: Normal Oral Care: Normal Shopping: Normal Toileting: Normal Transferring: Normal Walking: Normal Cognitive Status reviewed on 10/10/2014 ------------------------------------------------ Attention: Normal Decision Making: Normal Language: Normal Memory: Normal Motor: Normal Perception: Normal Problem Solving: Normal Visual and Spatial: Normal   Family History:Reviewed  Family Health History Mother, Deceased; Kidney or renal cancer;  Father, Deceased; Liver cancer;     Objective Information: General:Well appearing, well nourished in no  distress. Heart:RRR, no murmur or gallop.  Normal S1, S2.  No S3, S4.  Lungs:  CTA bilaterally, no wheezes, rhonchi, rales.  Breathing unlabored. Abdomen:Soft, NT/ND, no HSM, no masses. deferred to procedure  Assessment:Need for screening TCS  Diagnoses: V76.51  Z12.11 Screening for malignant neoplasm of colon (Encounter for screening for malignant neoplasm of colon)  Procedures: 41962 - OFFICE OUTPATIENT NEW 20 MINUTES    Plan:  Scheduled for TCS on 11/07/14.   Patient Education:Alternative treatments to surgery were discussed with patient (and family).  Risks and benefits  of procedure including bleeding and perforation were fully explained to the patient (and family) who gave informed consent. Patient/family questions were addressed.  Follow-up:Pending Surgery

## 2014-11-07 ENCOUNTER — Ambulatory Visit (HOSPITAL_COMMUNITY)
Admission: RE | Admit: 2014-11-07 | Discharge: 2014-11-07 | Disposition: A | Discharge status: Home / Self Care | Payer: BC Managed Care – PPO | Source: Ambulatory Visit | Attending: General Surgery | Admitting: General Surgery

## 2014-11-07 ENCOUNTER — Encounter (HOSPITAL_COMMUNITY): Payer: Self-pay | Admitting: *Deleted

## 2014-11-07 ENCOUNTER — Encounter (HOSPITAL_COMMUNITY)
Admission: RE | Disposition: A | Discharge status: Home / Self Care | Payer: Self-pay | Source: Ambulatory Visit | Attending: General Surgery

## 2014-11-07 DIAGNOSIS — E780 Pure hypercholesterolemia: Secondary | ICD-10-CM | POA: Diagnosis not present

## 2014-11-07 DIAGNOSIS — Z794 Long term (current) use of insulin: Secondary | ICD-10-CM | POA: Diagnosis not present

## 2014-11-07 DIAGNOSIS — Z1211 Encounter for screening for malignant neoplasm of colon: Secondary | ICD-10-CM | POA: Insufficient documentation

## 2014-11-07 DIAGNOSIS — E119 Type 2 diabetes mellitus without complications: Secondary | ICD-10-CM | POA: Diagnosis not present

## 2014-11-07 DIAGNOSIS — E039 Hypothyroidism, unspecified: Secondary | ICD-10-CM | POA: Diagnosis not present

## 2014-11-07 HISTORY — PX: COLONOSCOPY: SHX5424

## 2014-11-07 HISTORY — DX: Essential (primary) hypertension: I10

## 2014-11-07 LAB — GLUCOSE, CAPILLARY: Glucose-Capillary: 161 mg/dL — ABNORMAL HIGH (ref 70–99)

## 2014-11-07 SURGERY — COLONOSCOPY
Anesthesia: Moderate Sedation

## 2014-11-07 MED ORDER — STERILE WATER FOR IRRIGATION IR SOLN
Status: DC | PRN
  Administered 2014-11-07: 08:00:00

## 2014-11-07 MED ORDER — MEPERIDINE HCL 50 MG/ML IJ SOLN
INTRAMUSCULAR | Status: AC
  Filled 2014-11-07: qty 1

## 2014-11-07 MED ORDER — SODIUM CHLORIDE 0.9 % IV SOLN
INTRAVENOUS | Status: DC
  Administered 2014-11-07: 08:00:00 via INTRAVENOUS

## 2014-11-07 MED ORDER — MIDAZOLAM HCL 5 MG/5ML IJ SOLN
INTRAMUSCULAR | Status: AC
  Filled 2014-11-07: qty 5

## 2014-11-07 MED ORDER — MIDAZOLAM HCL 5 MG/5ML IJ SOLN
INTRAMUSCULAR | Status: DC | PRN
  Administered 2014-11-07: 1 mg via INTRAVENOUS
  Administered 2014-11-07: 4 mg via INTRAVENOUS

## 2014-11-07 MED ORDER — MEPERIDINE HCL 50 MG/ML IJ SOLN
INTRAMUSCULAR | Status: DC | PRN
  Administered 2014-11-07: 50 mg via INTRAVENOUS

## 2014-11-07 NOTE — Interval H&P Note (Signed)
History and Physical Interval Note:  11/07/2014 8:19 AM  Frederick Robinson  has presented today for surgery, with the diagnosis of screening  The various methods of treatment have been discussed with the patient and family. After consideration of risks, benefits and other options for treatment, the patient has consented to  Procedure(s) with comments: COLONOSCOPY (N/A) - 830 as a surgical intervention .  The patient's history has been reviewed, patient examined, no change in status, stable for surgery.  I have reviewed the patient's chart and labs.  Questions were answered to the patient's satisfaction.     Aviva Signs A

## 2014-11-07 NOTE — Op Note (Signed)
[facility] [address] [place], [address]   COLONOSCOPY PROCEDURE REPORT     EXAM DATE: 2014-11-30  PATIENT NAME:      Frederick Robinson, Frederick Robinson           MR #:      [id number]  BIRTHDATE:       1961-03-26      VISIT #:     [id number]  ATTENDING:     Aviva Signs, MD     STATUS:     outpatient REFERRING MD: ASA CLASS:        Class I  INDICATIONS:  The patient is a 53 yr old male here for a colonoscopy due to average risk for colon cancer. PROCEDURE PERFORMED:     Colonoscopy, screening MEDICATIONS:     Versed 5 mg IV and Demerol 50 mg IV ESTIMATED BLOOD LOSS:     None  CONSENT: The patient understands the risks and benefits of the procedure and understands that these risks include, but are not limited to: sedation, allergic reaction, infection, perforation and/or bleeding. Alternative means of evaluation and treatment include, among others: physical exam, x-rays, and/or surgical intervention. The patient elects to proceed with this endoscopic procedure.  DESCRIPTION OF PROCEDURE: During intra-op preparation period all mechanical & medical equipment was checked for proper function. Hand hygiene and appropriate measures for infection prevention was taken. After the risks, benefits and alternatives of the procedure were thoroughly explained, Informed consent was verified, confirmed and timeout was successfully executed by the treatment team. A digital exam revealed no abnormalities of the rectum.      The EC-[id number] ([id number]) endoscope was introduced through the anus and advanced to the cecum, which was identified by both the appendix and ileocecal valve. No adverse events experienced. The prep was adequate, using Trilyte. The instrument was then slowly withdrawn as the colon was fully examined.   COLON FINDINGS: A normal appearing cecum, ileocecal valve, and appendiceal orifice were identified.  The ascending, transverse, descending, sigmoid colon, and  rectum appeared unremarkable. Retroflexed views revealed no abnormalities.  The scope was then completely withdrawn from the patient and the procedure terminated.  WITHDRAWAL TIME: 4 minutes 0 seconds    ADVERSE EVENTS:      There were no immediate complications.  IMPRESSIONS:     Normal colonoscopy  RECOMMENDATIONS:     Repeat Colonscopy in 10 years. RECALL:  Aviva Signs, MD eSigned:  Aviva Signs, MD 11/30/14 8:41 AM   cc:  CPT CODES: ICD CODES:  The ICD and CPT codes recommended by this software are interpretations from the data that the clinical staff has captured with the software.  The verification of the translation of this report to the ICD and CPT codes and modifiers is the sole responsibility of the health care institution and practicing physician where this report was generated.  [employer] will not be held responsible for the validity of the ICD and CPT codes included on this report.  AMA assumes no liability for data contained or not contained herein. CPT is a Designer, television/film set of the [employer].

## 2014-11-07 NOTE — Discharge Instructions (Signed)

## 2014-11-08 ENCOUNTER — Encounter (HOSPITAL_COMMUNITY): Payer: Self-pay | Admitting: General Surgery

## 2015-01-30 ENCOUNTER — Ambulatory Visit (INDEPENDENT_AMBULATORY_CARE_PROVIDER_SITE_OTHER): Payer: BLUE CROSS/BLUE SHIELD | Admitting: Family

## 2015-01-30 ENCOUNTER — Encounter: Payer: Self-pay | Admitting: Family

## 2015-01-30 VITALS — BP 176/90 | HR 72 | Temp 98.2°F | Ht 74.5 in | Wt 262.0 lb

## 2015-01-30 DIAGNOSIS — M25512 Pain in left shoulder: Secondary | ICD-10-CM

## 2015-01-30 DIAGNOSIS — I10 Essential (primary) hypertension: Secondary | ICD-10-CM | POA: Diagnosis not present

## 2015-01-30 DIAGNOSIS — M25511 Pain in right shoulder: Secondary | ICD-10-CM

## 2015-01-30 MED ORDER — KETOROLAC TROMETHAMINE 60 MG/2ML IM SOLN
60.0000 mg | Freq: Once | INTRAMUSCULAR | Status: AC
  Administered 2015-01-30: 60 mg via INTRAMUSCULAR

## 2015-01-30 MED ORDER — METHYLPREDNISOLONE ACETATE 80 MG/ML IJ SUSP
40.0000 mg | Freq: Once | INTRAMUSCULAR | Status: AC
  Administered 2015-01-30: 40 mg via INTRAMUSCULAR

## 2015-01-30 MED ORDER — METHYLPREDNISOLONE ACETATE 40 MG/ML IJ SUSP: 40.0000 mg | Freq: Once | INTRAMUSCULAR | Status: DC

## 2015-01-30 MED ORDER — MELOXICAM 15 MG PO TABS: 15.0000 mg | ORAL_TABLET | Freq: Every day | ORAL | Status: DC

## 2015-01-30 MED ORDER — LISINOPRIL 5 MG PO TABS: 5.0000 mg | ORAL_TABLET | Freq: Every day | ORAL | Status: DC

## 2015-01-30 NOTE — Patient Instructions (Signed)

## 2015-01-30 NOTE — Progress Notes (Signed)
   Subjective:    Patient ID: Frederick Robinson, male    DOB: Oct 23, 1961, 54 y.o.   MRN: [id number]  Shoulder Pain  The pain is present in the right shoulder and left shoulder. This is a new problem. The current episode started more than 1 month ago. There has been no history of extremity trauma. The problem occurs intermittently. The problem has been waxing and waning. The quality of the pain is described as sharp. The pain is at a severity of 10/10. Associated symptoms include a limited range of motion and stiffness. Pertinent negatives include no inability to bear weight, joint swelling, numbness or tingling. The symptoms are aggravated by activity. He has tried OTC pain meds and rest for the symptoms. The treatment provided mild relief. His past medical history is significant for diabetes. There is no history of gout.      Review of Systems  Constitutional: Negative.   HENT: Negative.   Respiratory: Negative.   Cardiovascular: Negative.   Gastrointestinal: Negative.   Endocrine: Negative.   Genitourinary: Negative.   Musculoskeletal: Positive for stiffness. Negative for gout.  Neurological: Negative.  Negative for tingling and numbness.  Hematological: Negative.   Psychiatric/Behavioral: Negative.   All other systems reviewed and are negative.      Objective:   Physical Exam  Constitutional: He is oriented to person, place, and time. He appears well-developed and well-nourished. No distress.  HENT:  Head: Normocephalic.  Right Ear: External ear normal.  Left Ear: External ear normal.  Mouth/Throat: Oropharynx is clear and moist.  Eyes: Pupils are equal, round, and reactive to light. Right eye exhibits no discharge. Left eye exhibits no discharge.  Neck: Normal range of motion. Neck supple. No thyromegaly present.  Cardiovascular: Normal rate, regular rhythm, normal heart sounds and intact distal pulses.   No murmur heard. Pulmonary/Chest: Effort normal and breath sounds normal.  No respiratory distress. He has no wheezes.  Abdominal: Soft. Bowel sounds are normal. He exhibits no distension. There is no tenderness.  Musculoskeletal: He exhibits no edema or tenderness.  Neurological: He is alert and oriented to person, place, and time. He has normal reflexes. No cranial nerve deficit.  Skin: Skin is warm and dry. No rash noted. No erythema.  Psychiatric: He has a normal mood and affect. His behavior is normal. Judgment and thought content normal.  Vitals reviewed.   BP 176/90 mmHg  Pulse 72  Temp(Src) 98.2 F (36.8 C) (Oral)  Ht 6' 2.5" (1.892 m)  Wt 262 lb (118.842 kg)  BMI 33.20 kg/m2       Assessment & Plan:  1. Essential hypertension  - lisinopril (PRINIVIL,ZESTRIL) 5 MG tablet; Take 1 tablet (5 mg total) by mouth daily.  Dispense: 90 tablet; Refill: 4  2. Shoulder pain, bilateral -Rest -Ice No other NSAID's while taking Mobic - meloxicam (MOBIC) 15 MG tablet; Take 1 tablet (15 mg total) by mouth daily.  Dispense: 30 tablet; Refill: 4 - ketorolac (TORADOL) injection 60 mg; Inject 2 mLs (60 mg total) into the muscle once. - methylPREDNISolone acetate (DEPO-MEDROL) injection 40 mg; Inject 1 mL (40 mg total) into the muscle once.  Evelina Dun, FNP

## 2015-04-13 ENCOUNTER — Other Ambulatory Visit: Payer: Self-pay

## 2015-04-13 DIAGNOSIS — M25511 Pain in right shoulder: Secondary | ICD-10-CM

## 2015-04-13 DIAGNOSIS — M25512 Pain in left shoulder: Principal | ICD-10-CM

## 2015-04-13 MED ORDER — MELOXICAM 15 MG PO TABS: 15.0000 mg | ORAL_TABLET | Freq: Every day | ORAL | Status: DC

## 2015-07-16 ENCOUNTER — Telehealth: Payer: Self-pay | Admitting: Family

## 2015-07-16 DIAGNOSIS — N529 Male erectile dysfunction, unspecified: Secondary | ICD-10-CM

## 2015-07-16 NOTE — Telephone Encounter (Signed)
hasnt been seen but once in 01/2015

## 2015-07-17 MED ORDER — SILDENAFIL CITRATE 20 MG PO TABS: ORAL_TABLET | ORAL | Status: DC

## 2015-07-17 NOTE — Telephone Encounter (Signed)
lmovm that refill was called to [facility] & to call back to make an appt for followup, last visit was in March

## 2015-07-17 NOTE — Telephone Encounter (Signed)
RX ready for pick up. Pt needs to make appt for chronic follow up

## 2015-08-06 ENCOUNTER — Encounter: Payer: Self-pay | Admitting: Family

## 2015-08-06 ENCOUNTER — Ambulatory Visit (INDEPENDENT_AMBULATORY_CARE_PROVIDER_SITE_OTHER): Payer: BLUE CROSS/BLUE SHIELD | Admitting: Family

## 2015-08-06 VITALS — BP 159/83 | HR 78 | Temp 97.2°F | Ht 74.5 in | Wt 264.0 lb

## 2015-08-06 DIAGNOSIS — I10 Essential (primary) hypertension: Secondary | ICD-10-CM

## 2015-08-06 DIAGNOSIS — E1165 Type 2 diabetes mellitus with hyperglycemia: Secondary | ICD-10-CM | POA: Diagnosis not present

## 2015-08-06 DIAGNOSIS — E039 Hypothyroidism, unspecified: Secondary | ICD-10-CM

## 2015-08-06 DIAGNOSIS — J309 Allergic rhinitis, unspecified: Secondary | ICD-10-CM | POA: Diagnosis not present

## 2015-08-06 DIAGNOSIS — Z1159 Encounter for screening for other viral diseases: Secondary | ICD-10-CM | POA: Diagnosis not present

## 2015-08-06 DIAGNOSIS — E785 Hyperlipidemia, unspecified: Secondary | ICD-10-CM

## 2015-08-06 MED ORDER — LOSARTAN POTASSIUM 100 MG PO TABS
100.0000 mg | ORAL_TABLET | Freq: Every day | ORAL | Status: DC
Start: 2015-08-06 — End: 2015-08-06

## 2015-08-06 MED ORDER — LOSARTAN POTASSIUM 100 MG PO TABS
100.0000 mg | ORAL_TABLET | Freq: Every day | ORAL | Status: DC
Start: 2015-08-06 — End: 2015-09-05

## 2015-08-06 NOTE — Progress Notes (Signed)
Subjective:    Patient ID: Frederick Robinson, male    DOB: 03-11-1961, 54 y.o.   MRN: [id number]  Pt presents to the office today for chronic follow up. Pt states he see's an endocrinologists every 3 months who manages his diabetes and thyroid. Pt states his blood sugars are still elevated, but continues to work on the low carb diet. Pt states his hgbA1c was 7.5. Diabetes He presents for his follow-up diabetic visit. He has type 2 diabetes mellitus. His disease course has been worsening. There are no hypoglycemic associated symptoms. Pertinent negatives for hypoglycemia include no confusion, headaches, hunger, mood changes, nervousness/anxiousness or sleepiness. Associated symptoms include foot paresthesias and visual change. Pertinent negatives for diabetes include no blurred vision and no foot ulcerations. There are no hypoglycemic complications. Pertinent negatives for hypoglycemia complications include no blackouts and no hospitalization. Symptoms are stable. Diabetic complications include peripheral neuropathy. Pertinent negatives for diabetic complications include no CVA, heart disease or nephropathy. Risk factors for coronary artery disease include dyslipidemia, hypertension, male sex, obesity and stress. Current diabetic treatment includes oral agent (dual therapy) and insulin injections. He is compliant with treatment most of the time. He is following a generally unhealthy diet. His breakfast blood glucose range is generally 140-180 mg/dl. An ACE inhibitor/angiotensin II receptor blocker is being taken. Eye exam is not current.  Hypertension This is a chronic problem. The current episode started more than 1 year ago. The problem has been waxing and waning since onset. The problem is uncontrolled. Pertinent negatives include no anxiety, blurred vision, headaches, palpitations, peripheral edema or shortness of breath. Risk factors for coronary artery disease include diabetes mellitus, dyslipidemia, male  gender, obesity and smoking/tobacco exposure. Past treatments include ACE inhibitors. The current treatment provides no improvement. Hypertensive end-organ damage includes a thyroid problem. There is no history of kidney disease, CAD/MI, CVA or heart failure.  Hyperlipidemia This is a chronic problem. The current episode started more than 1 year ago. The problem is uncontrolled. Recent lipid tests were reviewed and are high. Exacerbating diseases include diabetes and hypothyroidism. Pertinent negatives include no shortness of breath. Current antihyperlipidemic treatment includes statins. The current treatment provides mild improvement of lipids. Risk factors for coronary artery disease include diabetes mellitus, dyslipidemia, family history, hypertension, male sex and obesity.  Thyroid Problem Presents for follow-up visit. Symptoms include visual change. Patient reports no anxiety, depressed mood, diaphoresis, diarrhea, heat intolerance or palpitations. The symptoms have been stable. Past treatments include levothyroxine. The treatment provided significant relief. His past medical history is significant for diabetes and hyperlipidemia. There is no history of heart failure.      Review of Systems  Constitutional: Negative.  Negative for diaphoresis.  HENT: Negative.   Eyes: Negative for blurred vision.  Respiratory: Negative.  Negative for shortness of breath.   Cardiovascular: Negative.  Negative for palpitations.  Gastrointestinal: Negative.  Negative for diarrhea.  Endocrine: Negative.  Negative for heat intolerance.  Genitourinary: Negative.   Musculoskeletal: Negative.   Neurological: Negative.  Negative for headaches.  Hematological: Negative.   Psychiatric/Behavioral: Negative.  Negative for confusion. The patient is not nervous/anxious.   All other systems reviewed and are negative.      Objective:   Physical Exam  Constitutional: He is oriented to person, place, and time. He  appears well-developed and well-nourished. No distress.  HENT:  Head: Normocephalic.  Right Ear: External ear normal.  Left Ear: External ear normal.  Nose: Nose normal.  Mouth/Throat: Oropharynx is clear and moist.  Eyes: Pupils are equal, round, and reactive to light. Right eye exhibits no discharge. Left eye exhibits no discharge.  Neck: Normal range of motion. Neck supple. No thyromegaly present.  Cardiovascular: Normal rate, regular rhythm, normal heart sounds and intact distal pulses.   No murmur heard. Pulmonary/Chest: Effort normal and breath sounds normal. No respiratory distress. He has no wheezes.  Abdominal: Soft. Bowel sounds are normal. He exhibits no distension. There is no tenderness.  Musculoskeletal: Normal range of motion. He exhibits no edema or tenderness.  Neurological: He is alert and oriented to person, place, and time. He has normal reflexes. No cranial nerve deficit.  Skin: Skin is warm and dry. No rash noted. No erythema.  Psychiatric: He has a normal mood and affect. His behavior is normal. Judgment and thought content normal.  Vitals reviewed.     BP 159/83 mmHg  Pulse 78  Temp(Src) 97.2 F (36.2 C) (Oral)  Ht 6' 2.5" (1.892 m)  Wt 264 lb (119.75 kg)  BMI 33.45 kg/m2     Assessment & Plan:  1. Essential hypertension -Pt's lisinopril stopped related to cough Pt's started on losartan 100 mg started today - CMP14+EGFR - losartan (COZAAR) 100 MG tablet; Take 1 tablet (100 mg total) by mouth daily.  Dispense: 30 tablet; Refill: 0  2. Allergic rhinitis, unspecified allergic rhinitis type - CMP14+EGFR  3. Hypothyroidism, unspecified hypothyroidism type - CMP14+EGFR  4. Type 2 diabetes mellitus with hyperglycemia - CMP14+EGFR  5. Hyperlipidemia - CMP14+EGFR - Lipid panel  6. Need for hepatitis C screening test - CMP14+EGFR - Hepatitis C antibody   Continue all meds, keep Endocrinologists appts Labs pending Health Maintenance  reviewed Diet and exercise encouraged RTO 2 weeks for HTN  Evelina Dun, FNP

## 2015-08-06 NOTE — Patient Instructions (Signed)
Health Maintenance A healthy lifestyle and preventative care can promote health and wellness.  Maintain regular health, dental, and eye exams.  Eat a healthy diet. Foods like vegetables, fruits, whole grains, low-fat dairy products, and lean protein foods contain the nutrients you need and are low in calories. Decrease your intake of foods high in solid fats, added sugars, and salt. Get information about a proper diet from your health care Donalyn Schneeberger, if necessary.  Regular physical exercise is one of the most important things you can do for your health. Most adults should get at least 150 minutes of moderate-intensity exercise (any activity that increases your heart rate and causes you to sweat) each week. In addition, most adults need muscle-strengthening exercises on 2 or more days a week.   Maintain a healthy weight. The body mass index (BMI) is a screening tool to identify possible weight problems. It provides an estimate of body fat based on height and weight. Your health care Sherra Kimmons can find your BMI and can help you achieve or maintain a healthy weight. For males 20 years and older:  A BMI below 18.5 is considered underweight.  A BMI of 18.5 to 24.9 is normal.  A BMI of 25 to 29.9 is considered overweight.  A BMI of 30 and above is considered obese.  Maintain normal blood lipids and cholesterol by exercising and minimizing your intake of saturated fat. Eat a balanced diet with plenty of fruits and vegetables. Blood tests for lipids and cholesterol should begin at age 20 and be repeated every 5 years. If your lipid or cholesterol levels are high, you are over age 50, or you are at high risk for heart disease, you may need your cholesterol levels checked more frequently.Ongoing high lipid and cholesterol levels should be treated with medicines if diet and exercise are not working.  If you smoke, find out from your health care Bryker Fletchall how to quit. If you do not use tobacco, do not  start.  Lung cancer screening is recommended for adults aged 55-80 years who are at high risk for developing lung cancer because of a history of smoking. A yearly low-dose CT scan of the lungs is recommended for people who have at least a 30-pack-year history of smoking and are current smokers or have quit within the past 15 years. A pack year of smoking is smoking an average of 1 pack of cigarettes a day for 1 year (for example, a 30-pack-year history of smoking could mean smoking 1 pack a day for 30 years or 2 packs a day for 15 years). Yearly screening should continue until the smoker has stopped smoking for at least 15 years. Yearly screening should be stopped for people who develop a health problem that would prevent them from having lung cancer treatment.  If you choose to drink alcohol, do not have more than 2 drinks per day. One drink is considered to be 12 oz (360 mL) of beer, 5 oz (150 mL) of wine, or 1.5 oz (45 mL) of liquor.  Avoid the use of street drugs. Do not share needles with anyone. Ask for help if you need support or instructions about stopping the use of drugs.  High blood pressure causes heart disease and increases the risk of stroke. Blood pressure should be checked at least every 1-2 years. Ongoing high blood pressure should be treated with medicines if weight loss and exercise are not effective.  If you are 45-79 years old, ask your health care Kailia Starry if   you should take aspirin to prevent heart disease.  Diabetes screening involves taking a blood sample to check your fasting blood sugar level. This should be done once every 3 years after age 45 if you are at a normal weight and without risk factors for diabetes. Testing should be considered at a younger age or be carried out more frequently if you are overweight and have at least 1 risk factor for diabetes.  Colorectal cancer can be detected and often prevented. Most routine colorectal cancer screening begins at the age of 50  and continues through age 75. However, your health care Gesenia Bantz may recommend screening at an earlier age if you have risk factors for colon cancer. On a yearly basis, your health care Iram Astorino may provide home test kits to check for hidden blood in the stool. A small camera at the end of a tube may be used to directly examine the colon (sigmoidoscopy or colonoscopy) to detect the earliest forms of colorectal cancer. Talk to your health care Khloei Spiker about this at age 50 when routine screening begins. A direct exam of the colon should be repeated every 5-10 years through age 75, unless early forms of precancerous polyps or small growths are found.  People who are at an increased risk for hepatitis B should be screened for this virus. You are considered at high risk for hepatitis B if:  You were born in a country where hepatitis B occurs often. Talk with your health care Yamira Papa about which countries are considered high risk.  Your parents were born in a high-risk country and you have not received a shot to protect against hepatitis B (hepatitis B vaccine).  You have HIV or AIDS.  You use needles to inject street drugs.  You live with, or have sex with, someone who has hepatitis B.  You are a man who has sex with other men (MSM).  You get hemodialysis treatment.  You take certain medicines for conditions like cancer, organ transplantation, and autoimmune conditions.  Hepatitis C blood testing is recommended for all people born from 1945 through 1965 and any individual with known risk factors for hepatitis C.  Healthy men should no longer receive prostate-specific antigen (PSA) blood tests as part of routine cancer screening. Talk to your health care Zakyla Tonche about prostate cancer screening.  Testicular cancer screening is not recommended for adolescents or adult males who have no symptoms. Screening includes self-exam, a health care Doren Kaspar exam, and other screening tests. Consult with your  health care Faisal Stradling about any symptoms you have or any concerns you have about testicular cancer.  Practice safe sex. Use condoms and avoid high-risk sexual practices to reduce the spread of sexually transmitted infections (STIs).  You should be screened for STIs, including gonorrhea and chlamydia if:  You are sexually active and are younger than 24 years.  You are older than 24 years, and your health care Mayank Teuscher tells you that you are at risk for this type of infection.  Your sexual activity has changed since you were last screened, and you are at an increased risk for chlamydia or gonorrhea. Ask your health care Mendi Constable if you are at risk.  If you are at risk of being infected with HIV, it is recommended that you take a prescription medicine daily to prevent HIV infection. This is called pre-exposure prophylaxis (PrEP). You are considered at risk if:  You are a man who has sex with other men (MSM).  You are a heterosexual man who   is sexually active with multiple partners.  You take drugs by injection.  You are sexually active with a partner who has HIV.  Talk with your health care Seleste Tallman about whether you are at high risk of being infected with HIV. If you choose to begin PrEP, you should first be tested for HIV. You should then be tested every 3 months for as long as you are taking PrEP.  Use sunscreen. Apply sunscreen liberally and repeatedly throughout the day. You should seek shade when your shadow is shorter than you. Protect yourself by wearing long sleeves, pants, a wide-brimmed hat, and sunglasses year round whenever you are outdoors.  Tell your health care Arzella Rehmann of new moles or changes in moles, especially if there is a change in shape or color. Also, tell your health care Malonie Tatum if a mole is larger than the size of a pencil eraser.  A one-time screening for abdominal aortic aneurysm (AAA) and surgical repair of large AAAs by ultrasound is recommended for men aged  65-75 years who are current or former smokers.  Stay current with your vaccines (immunizations). Document Released: 05/01/2008 Document Revised: 11/08/2013 Document Reviewed: 03/31/2011 [employer] Patient Information 2015 [employer]. This information is not intended to replace advice given to you by your health care Fawzi Melman. Make sure you discuss any questions you have with your health care Baker Kogler.  

## 2015-08-28 ENCOUNTER — Other Ambulatory Visit: Payer: BLUE CROSS/BLUE SHIELD

## 2015-08-28 NOTE — Progress Notes (Signed)
Lab only order from last visit on 9-19

## 2015-08-29 LAB — CMP14+EGFR
ALT: 38 IU/L (ref 0–44)
AST: 23 IU/L (ref 0–40)
Albumin/Globulin Ratio: 2.1 (ref 1.1–2.5)
Albumin: 4.5 g/dL (ref 3.5–5.5)
Alkaline Phosphatase: 48 IU/L (ref 39–117)
BILIRUBIN TOTAL: 0.4 mg/dL (ref 0.0–1.2)
BUN/Creatinine Ratio: 10 (ref 9–20)
BUN: 12 mg/dL (ref 6–24)
CHLORIDE: 104 mmol/L (ref 97–108)
CO2: 21 mmol/L (ref 18–29)
Calcium: 9.4 mg/dL (ref 8.7–10.2)
Creatinine, Ser: 1.18 mg/dL (ref 0.76–1.27)
GFR calc Af Amer: 80 mL/min/{1.73_m2} (ref >59)
GFR calc non Af Amer: 69 mL/min/{1.73_m2} (ref >59)
GLUCOSE: 232 mg/dL — AB (ref 65–99)
Globulin, Total: 2.1 g/dL (ref 1.5–4.5)
POTASSIUM: 4.8 mmol/L (ref 3.5–5.2)
Sodium: 140 mmol/L (ref 134–144)
TOTAL PROTEIN: 6.6 g/dL (ref 6.0–8.5)

## 2015-08-29 LAB — LIPID PANEL
CHOLESTEROL TOTAL: 217 mg/dL — AB (ref 100–199)
Chol/HDL Ratio: 7.5 ratio units — ABNORMAL HIGH (ref 0.0–5.0)
HDL: 29 mg/dL — AB (ref >39)
LDL Calculated: 127 mg/dL — ABNORMAL HIGH (ref 0–99)
Triglycerides: 307 mg/dL — ABNORMAL HIGH (ref 0–149)
VLDL CHOLESTEROL CAL: 61 mg/dL — AB (ref 5–40)

## 2015-08-29 LAB — HEPATITIS C ANTIBODY: Hep C Virus Ab: 0.1 s/co ratio (ref 0.0–0.9)

## 2015-08-30 ENCOUNTER — Ambulatory Visit: Payer: BLUE CROSS/BLUE SHIELD | Admitting: Family

## 2015-08-30 ENCOUNTER — Other Ambulatory Visit: Payer: Self-pay | Admitting: Family

## 2015-08-30 MED ORDER — ATORVASTATIN CALCIUM 40 MG PO TABS: 40.0000 mg | ORAL_TABLET | Freq: Every day | ORAL | Status: DC

## 2015-08-30 NOTE — Progress Notes (Signed)
Patient aware.

## 2015-08-31 ENCOUNTER — Encounter: Payer: Self-pay | Admitting: Family

## 2015-08-31 ENCOUNTER — Ambulatory Visit (INDEPENDENT_AMBULATORY_CARE_PROVIDER_SITE_OTHER): Payer: BLUE CROSS/BLUE SHIELD | Admitting: Family

## 2015-08-31 VITALS — BP 145/82 | HR 85 | Temp 98.0°F | Ht 74.5 in | Wt 263.4 lb

## 2015-08-31 DIAGNOSIS — E1165 Type 2 diabetes mellitus with hyperglycemia: Secondary | ICD-10-CM | POA: Diagnosis not present

## 2015-08-31 DIAGNOSIS — Z794 Long term (current) use of insulin: Secondary | ICD-10-CM

## 2015-08-31 DIAGNOSIS — Z23 Encounter for immunization: Secondary | ICD-10-CM | POA: Diagnosis not present

## 2015-08-31 DIAGNOSIS — I10 Essential (primary) hypertension: Secondary | ICD-10-CM | POA: Diagnosis not present

## 2015-08-31 LAB — POCT GLYCOSYLATED HEMOGLOBIN (HGB A1C): HEMOGLOBIN A1C: 8.6

## 2015-08-31 MED ORDER — LOSARTAN POTASSIUM-HCTZ 100-25 MG PO TABS: 1.0000 | ORAL_TABLET | Freq: Every day | ORAL | Status: DC

## 2015-08-31 NOTE — Patient Instructions (Signed)
DASH Eating Plan °DASH stands for "Dietary Approaches to Stop Hypertension." The DASH eating plan is a healthy eating plan that has been shown to reduce high blood pressure (hypertension). Additional health benefits may include reducing the risk of type 2 diabetes mellitus, heart disease, and stroke. The DASH eating plan may also help with weight loss. °WHAT DO I NEED TO KNOW ABOUT THE DASH EATING PLAN? °For the DASH eating plan, you will follow these general guidelines: °· Choose foods with a percent daily value for sodium of less than 5% (as listed on the food label). °· Use salt-free seasonings or herbs instead of table salt or sea salt. °· Check with your health care provider or pharmacist before using salt substitutes. °· Eat lower-sodium products, often labeled as "lower sodium" or "no salt added." °· Eat fresh foods. °· Eat more vegetables, fruits, and low-fat dairy products. °· Choose whole grains. Look for the word "whole" as the first word in the ingredient list. °· Choose fish and skinless chicken or turkey more often than red meat. Limit fish, poultry, and meat to 6 oz (170 g) each day. °· Limit sweets, desserts, sugars, and sugary drinks. °· Choose heart-healthy fats. °· Limit cheese to 1 oz (28 g) per day. °· Eat more home-cooked food and less restaurant, buffet, and fast food. °· Limit fried foods. °· Cook foods using methods other than frying. °· Limit canned vegetables. If you do use them, rinse them well to decrease the sodium. °· When eating at a restaurant, ask that your food be prepared with less salt, or no salt if possible. °WHAT FOODS CAN I EAT? °Seek help from a dietitian for individual calorie needs. °Grains °Whole grain or whole wheat bread. Brown rice. Whole grain or whole wheat pasta. Quinoa, bulgur, and whole grain cereals. Low-sodium cereals. Corn or whole wheat flour tortillas. Whole grain cornbread. Whole grain crackers. Low-sodium crackers. °Vegetables °Fresh or frozen vegetables  (raw, steamed, roasted, or grilled). Low-sodium or reduced-sodium tomato and vegetable juices. Low-sodium or reduced-sodium tomato sauce and paste. Low-sodium or reduced-sodium canned vegetables.  °Fruits °All fresh, canned (in natural juice), or frozen fruits. °Meat and Other Protein Products °Ground beef (85% or leaner), grass-fed beef, or beef trimmed of fat. Skinless chicken or turkey. Ground chicken or turkey. Pork trimmed of fat. All fish and seafood. Eggs. Dried beans, peas, or lentils. Unsalted nuts and seeds. Unsalted canned beans. °Dairy °Low-fat dairy products, such as skim or 1% milk, 2% or reduced-fat cheeses, low-fat ricotta or cottage cheese, or plain low-fat yogurt. Low-sodium or reduced-sodium cheeses. °Fats and Oils °Tub margarines without trans fats. Light or reduced-fat mayonnaise and salad dressings (reduced sodium). Avocado. Safflower, olive, or canola oils. Natural peanut or almond butter. °Other °Unsalted popcorn and pretzels. °The items listed above may not be a complete list of recommended foods or beverages. Contact your dietitian for more options. °WHAT FOODS ARE NOT RECOMMENDED? °Grains °White bread. White pasta. White rice. Refined cornbread. Bagels and croissants. Crackers that contain trans fat. °Vegetables °Creamed or fried vegetables. Vegetables in a cheese sauce. Regular canned vegetables. Regular canned tomato sauce and paste. Regular tomato and vegetable juices. °Fruits °Dried fruits. Canned fruit in light or heavy syrup. Fruit juice. °Meat and Other Protein Products °Fatty cuts of meat. Ribs, chicken wings, bacon, sausage, bologna, salami, chitterlings, fatback, hot dogs, bratwurst, and packaged luncheon meats. Salted nuts and seeds. Canned beans with salt. °Dairy °Whole or 2% milk, cream, half-and-half, and cream cheese. Whole-fat or sweetened yogurt. Full-fat   cheeses or blue cheese. Nondairy creamers and whipped toppings. Processed cheese, cheese spreads, or cheese  curds. °Condiments °Onion and garlic salt, seasoned salt, table salt, and sea salt. Canned and packaged gravies. Worcestershire sauce. Tartar sauce. Barbecue sauce. Teriyaki sauce. Soy sauce, including reduced sodium. Steak sauce. Fish sauce. Oyster sauce. Cocktail sauce. Horseradish. Ketchup and mustard. Meat flavorings and tenderizers. Bouillon cubes. Hot sauce. Tabasco sauce. Marinades. Taco seasonings. Relishes. °Fats and Oils °Butter, stick margarine, lard, shortening, ghee, and bacon fat. Coconut, palm kernel, or palm oils. Regular salad dressings. °Other °Pickles and olives. Salted popcorn and pretzels. °The items listed above may not be a complete list of foods and beverages to avoid. Contact your dietitian for more information. °WHERE CAN I FIND MORE INFORMATION? °[employer], [employer]: www.nhlbi.nih.gov/health/health-topics/topics/dash/ °  °This information is not intended to replace advice given to you by your health care provider. Make sure you discuss any questions you have with your health care provider. °  °Document Released: 10/23/2011 Document Revised: 11/24/2014 Document Reviewed: 09/07/2013 °Elsevier Interactive Patient Education ©2016 [employer] ° °Hypertension °Hypertension, commonly called high blood pressure, is when the force of blood pumping through your arteries is too strong. Your arteries are the blood vessels that carry blood from your heart throughout your body. A blood pressure reading consists of a higher number over a lower number, such as 110/72. The higher number (systolic) is the pressure inside your arteries when your heart pumps. The lower number (diastolic) is the pressure inside your arteries when your heart relaxes. Ideally you want your blood pressure below 120/80. °Hypertension forces your heart to work harder to pump blood. Your arteries may become narrow or stiff. Having untreated or uncontrolled hypertension can cause heart attack, stroke, kidney  disease, and other problems. °RISK FACTORS °Some risk factors for high blood pressure are controllable. Others are not.  °Risk factors you cannot control include:  °· Race. You may be at higher risk if you are African American. °· Age. Risk increases with age. °· Gender. Men are at higher risk than women before age 45 years. After age 65, women are at higher risk than men. °Risk factors you can control include: °· Not getting enough exercise or physical activity. °· Being overweight. °· Getting too much fat, sugar, calories, or salt in your diet. °· Drinking too much alcohol. °SIGNS AND SYMPTOMS °Hypertension does not usually cause signs or symptoms. Extremely high blood pressure (hypertensive crisis) may cause headache, anxiety, shortness of breath, and nosebleed. °DIAGNOSIS °To check if you have hypertension, your health care provider will measure your blood pressure while you are seated, with your arm held at the level of your heart. It should be measured at least twice using the same arm. Certain conditions can cause a difference in blood pressure between your right and left arms. A blood pressure reading that is higher than normal on one occasion does not mean that you need treatment. If it is not clear whether you have high blood pressure, you may be asked to return on a different day to have your blood pressure checked again. Or, you may be asked to monitor your blood pressure at home for 1 or more weeks. °TREATMENT °Treating high blood pressure includes making lifestyle changes and possibly taking medicine. Living a healthy lifestyle can help lower high blood pressure. You may need to change some of your habits. °Lifestyle changes may include: °· Following the DASH diet. This diet is high in fruits, vegetables, and whole   grains. It is low in salt, red meat, and added sugars. °· Keep your sodium intake below 2,300 mg per day. °· Getting at least 30-45 minutes of aerobic exercise at least 4 times per  week. °· Losing weight if necessary. °· Not smoking. °· Limiting alcoholic beverages. °· Learning ways to reduce stress. °Your health care provider may prescribe medicine if lifestyle changes are not enough to get your blood pressure under control, and if one of the following is true: °· You are 18-59 years of age and your systolic blood pressure is above 140. °· You are 60 years of age or older, and your systolic blood pressure is above 150. °· Your diastolic blood pressure is above 90. °· You have diabetes, and your systolic blood pressure is over 140 or your diastolic blood pressure is over 90. °· You have kidney disease and your blood pressure is above 140/90. °· You have heart disease and your blood pressure is above 140/90. °Your personal target blood pressure may vary depending on your medical conditions, your age, and other factors. °HOME CARE INSTRUCTIONS °· Have your blood pressure rechecked as directed by your health care provider.   °· Take medicines only as directed by your health care provider. Follow the directions carefully. Blood pressure medicines must be taken as prescribed. The medicine does not work as well when you skip doses. Skipping doses also puts you at risk for problems. °· Do not smoke.   °· Monitor your blood pressure at home as directed by your health care provider.  °SEEK MEDICAL CARE IF:  °· You think you are having a reaction to medicines taken. °· You have recurrent headaches or feel dizzy. °· You have swelling in your ankles. °· You have trouble with your vision. °SEEK IMMEDIATE MEDICAL CARE IF: °· You develop a severe headache or confusion. °· You have unusual weakness, numbness, or feel faint. °· You have severe chest or abdominal pain. °· You vomit repeatedly. °· You have trouble breathing. °MAKE SURE YOU:  °· Understand these instructions. °· Will watch your condition. °· Will get help right away if you are not doing well or get worse. °  °This information is not intended to  replace advice given to you by your health care provider. Make sure you discuss any questions you have with your health care provider. °  °Document Released: 11/03/2005 Document Revised: 03/20/2015 Document Reviewed: 08/26/2013 °Elsevier Interactive Patient Education ©2016 Elsevier Inc. ° °

## 2015-08-31 NOTE — Progress Notes (Signed)
   Subjective:    Patient ID: Frederick Robinson, male    DOB: 06/10/1961, 55 y.o.   MRN: [id number]  Pt presents to the office today to recheck HTN. Pt's BP is not at goal today. Hypertension This is a chronic problem. The current episode started more than 1 year ago. The problem has been waxing and waning since onset. The problem is uncontrolled. Associated symptoms include peripheral edema ("at times"). Pertinent negatives include no anxiety, headaches, palpitations, shortness of breath or sweats. Risk factors for coronary artery disease include dyslipidemia, male gender, obesity, family history and stress. Past treatments include angiotensin blockers. The current treatment provides mild improvement. There is no history of kidney disease, CAD/MI, CVA, heart failure or a thyroid problem. There is no history of sleep apnea.      Review of Systems  Constitutional: Negative.   HENT: Negative.   Respiratory: Negative.  Negative for shortness of breath.   Cardiovascular: Negative.  Negative for palpitations.  Gastrointestinal: Negative.   Endocrine: Negative.   Genitourinary: Negative.   Musculoskeletal: Negative.   Neurological: Negative.  Negative for headaches.  Hematological: Negative.   Psychiatric/Behavioral: Negative.   All other systems reviewed and are negative.      Objective:   Physical Exam  Constitutional: He is oriented to person, place, and time. He appears well-developed and well-nourished. No distress.  HENT:  Head: Normocephalic.  Right Ear: External ear normal.  Left Ear: External ear normal.  Nose: Nose normal.  Mouth/Throat: Oropharynx is clear and moist.  Eyes: Pupils are equal, round, and reactive to light. Right eye exhibits no discharge. Left eye exhibits no discharge.  Neck: Normal range of motion. Neck supple. No thyromegaly present.  Cardiovascular: Normal rate, regular rhythm, normal heart sounds and intact distal pulses.   No murmur heard. Pulmonary/Chest:  Effort normal and breath sounds normal. No respiratory distress. He has no wheezes.  Abdominal: Soft. Bowel sounds are normal. He exhibits no distension. There is no tenderness.  Musculoskeletal: Normal range of motion. He exhibits no edema or tenderness.  Neurological: He is alert and oriented to person, place, and time. He has normal reflexes. No cranial nerve deficit.  Skin: Skin is warm and dry. No rash noted. No erythema.  Psychiatric: He has a normal mood and affect. His behavior is normal. Judgment and thought content normal.  Vitals reviewed.     BP 145/82 mmHg  Pulse 85  Temp(Src) 98 F (36.7 C)  Ht 6' 2.5" (1.892 m)  Wt 263 lb 6.4 oz (119.477 kg)  BMI 33.38 kg/m2     Assessment & Plan:  1. Essential hypertension -Pt placed in HCTZ 25 mg today -Daily blood pressure log given with instructions on how to fill out and told to bring to next visit -Dash diet information given -Exercise encouraged - Stress Management  -Continue current meds -RTO in 2 weeks - BMP8+EGFR - losartan-hydrochlorothiazide (HYZAAR) 100-25 MG tablet; Take 1 tablet by mouth daily.  Dispense: 90 tablet; Refill: 3  2. Type 2 diabetes mellitus with hyperglycemia, with long-term current use of insulin (HCC) - POCT glycosylated hemoglobin (Hb A1C) -Pt to think about making appt with [place], FNP

## 2015-09-01 LAB — BMP8+EGFR
BUN / CREAT RATIO: 13 (ref 9–20)
BUN: 16 mg/dL (ref 6–24)
CHLORIDE: 99 mmol/L (ref 97–108)
CO2: 23 mmol/L (ref 18–29)
Calcium: 10.1 mg/dL (ref 8.7–10.2)
Creatinine, Ser: 1.22 mg/dL (ref 0.76–1.27)
GFR calc non Af Amer: 66 mL/min/{1.73_m2} (ref >59)
GFR, EST AFRICAN AMERICAN: 77 mL/min/{1.73_m2} (ref >59)
Glucose: 248 mg/dL — ABNORMAL HIGH (ref 65–99)
POTASSIUM: 5 mmol/L (ref 3.5–5.2)
Sodium: 139 mmol/L (ref 134–144)

## 2015-09-05 ENCOUNTER — Other Ambulatory Visit: Payer: Self-pay | Admitting: Family

## 2015-10-03 ENCOUNTER — Telehealth: Payer: Self-pay | Admitting: Family

## 2015-10-04 MED ORDER — LOSARTAN POTASSIUM 100 MG PO TABS: ORAL_TABLET | ORAL | Status: DC

## 2015-10-04 NOTE — Telephone Encounter (Signed)
Left detailed message that rx was sent over to the pharmacy as requested and to [facility] with any further questions or concerns.

## 2015-10-04 NOTE — Telephone Encounter (Signed)
Losartan Prescription sent to pharmacy

## 2016-01-25 ENCOUNTER — Ambulatory Visit: Payer: BLUE CROSS/BLUE SHIELD | Admitting: Family

## 2016-01-28 ENCOUNTER — Encounter: Payer: Self-pay | Admitting: Family

## 2016-01-28 ENCOUNTER — Telehealth: Payer: Self-pay | Admitting: *Deleted

## 2016-01-28 NOTE — Telephone Encounter (Signed)
Patient aware of missing his last appointment here.  He will call to recheck his hypertension in the future.  He had seen his endocrinologist recently and the blood pressure was good.

## 2016-02-20 ENCOUNTER — Telehealth: Payer: Self-pay | Admitting: Family

## 2016-07-04 LAB — HEMOGLOBIN A1C: Hemoglobin A1C: 7.4

## 2016-09-18 ENCOUNTER — Telehealth: Payer: Self-pay | Admitting: Family

## 2016-11-04 ENCOUNTER — Telehealth: Payer: Self-pay | Admitting: Family

## 2016-11-13 ENCOUNTER — Encounter: Payer: Self-pay | Admitting: *Deleted

## 2017-01-07 DIAGNOSIS — E7800 Pure hypercholesterolemia, unspecified: Secondary | ICD-10-CM | POA: Diagnosis not present

## 2017-01-07 DIAGNOSIS — I10 Essential (primary) hypertension: Secondary | ICD-10-CM | POA: Diagnosis not present

## 2017-01-07 DIAGNOSIS — E1165 Type 2 diabetes mellitus with hyperglycemia: Secondary | ICD-10-CM | POA: Diagnosis not present

## 2017-01-07 DIAGNOSIS — E039 Hypothyroidism, unspecified: Secondary | ICD-10-CM | POA: Diagnosis not present

## 2017-06-05 DIAGNOSIS — L919 Hypertrophic disorder of the skin, unspecified: Secondary | ICD-10-CM | POA: Diagnosis not present

## 2017-06-05 DIAGNOSIS — L739 Follicular disorder, unspecified: Secondary | ICD-10-CM | POA: Diagnosis not present

## 2017-07-06 DIAGNOSIS — E1165 Type 2 diabetes mellitus with hyperglycemia: Secondary | ICD-10-CM | POA: Diagnosis not present

## 2017-07-06 DIAGNOSIS — E7800 Pure hypercholesterolemia, unspecified: Secondary | ICD-10-CM | POA: Diagnosis not present

## 2017-07-06 DIAGNOSIS — E039 Hypothyroidism, unspecified: Secondary | ICD-10-CM | POA: Diagnosis not present

## 2017-07-24 DIAGNOSIS — R509 Fever, unspecified: Secondary | ICD-10-CM | POA: Diagnosis not present

## 2017-07-24 DIAGNOSIS — E119 Type 2 diabetes mellitus without complications: Secondary | ICD-10-CM | POA: Diagnosis not present

## 2017-10-20 ENCOUNTER — Encounter: Payer: Self-pay | Admitting: Family

## 2017-10-20 DIAGNOSIS — E039 Hypothyroidism, unspecified: Secondary | ICD-10-CM | POA: Diagnosis not present

## 2017-10-20 DIAGNOSIS — E7800 Pure hypercholesterolemia, unspecified: Secondary | ICD-10-CM | POA: Diagnosis not present

## 2017-10-20 DIAGNOSIS — E1165 Type 2 diabetes mellitus with hyperglycemia: Secondary | ICD-10-CM | POA: Diagnosis not present

## 2017-10-21 DIAGNOSIS — I10 Essential (primary) hypertension: Secondary | ICD-10-CM | POA: Diagnosis not present

## 2017-10-21 DIAGNOSIS — E1165 Type 2 diabetes mellitus with hyperglycemia: Secondary | ICD-10-CM | POA: Diagnosis not present

## 2017-10-21 DIAGNOSIS — E7800 Pure hypercholesterolemia, unspecified: Secondary | ICD-10-CM | POA: Diagnosis not present

## 2018-02-15 ENCOUNTER — Encounter: Payer: Self-pay | Admitting: Family Medicine

## 2018-02-15 ENCOUNTER — Ambulatory Visit (INDEPENDENT_AMBULATORY_CARE_PROVIDER_SITE_OTHER): Payer: 59 | Admitting: Family Medicine

## 2018-02-15 VITALS — BP 138/77 | HR 54 | Temp 98.5°F | Ht 74.5 in | Wt 244.0 lb

## 2018-02-15 DIAGNOSIS — E1165 Type 2 diabetes mellitus with hyperglycemia: Secondary | ICD-10-CM | POA: Diagnosis not present

## 2018-02-15 DIAGNOSIS — E1159 Type 2 diabetes mellitus with other circulatory complications: Secondary | ICD-10-CM | POA: Diagnosis not present

## 2018-02-15 DIAGNOSIS — Z0000 Encounter for general adult medical examination without abnormal findings: Secondary | ICD-10-CM | POA: Diagnosis not present

## 2018-02-15 DIAGNOSIS — Z794 Long term (current) use of insulin: Secondary | ICD-10-CM | POA: Diagnosis not present

## 2018-02-15 DIAGNOSIS — I152 Hypertension secondary to endocrine disorders: Secondary | ICD-10-CM

## 2018-02-15 DIAGNOSIS — Z23 Encounter for immunization: Secondary | ICD-10-CM

## 2018-02-15 DIAGNOSIS — E1169 Type 2 diabetes mellitus with other specified complication: Secondary | ICD-10-CM

## 2018-02-15 DIAGNOSIS — E039 Hypothyroidism, unspecified: Secondary | ICD-10-CM | POA: Diagnosis not present

## 2018-02-15 DIAGNOSIS — E785 Hyperlipidemia, unspecified: Secondary | ICD-10-CM

## 2018-02-15 DIAGNOSIS — Z125 Encounter for screening for malignant neoplasm of prostate: Secondary | ICD-10-CM

## 2018-02-15 DIAGNOSIS — I10 Essential (primary) hypertension: Secondary | ICD-10-CM

## 2018-02-15 LAB — BAYER DCA HB A1C WAIVED: HB A1C (BAYER DCA - WAIVED): 6.6 % (ref <7.0)

## 2018-02-15 MED ORDER — LOSARTAN POTASSIUM 100 MG PO TABS: 100.0000 mg | ORAL_TABLET | Freq: Every day | ORAL | 2 refills | Status: DC

## 2018-02-15 NOTE — Progress Notes (Signed)
BP 138/77   Pulse (!) 54   Temp 98.5 F (36.9 C) (Oral)   Ht 6' 2.5" (1.892 m)   Wt 244 lb (110.7 kg)   BMI 30.91 kg/m    Subjective:    Patient ID: Frederick Robinson, male    DOB: 10-29-1961, 57 y.o.   MRN: [id number]  HPI: Frederick Robinson is a 57 y.o. male presenting on 02/15/2018 for Annual Exam (patient is fasting; sees endocrinologist for diabetesand hypothyroidism, blood pressure has been elevated recently, patient increased his Losartan from 50 mg to 100 mg last week)   HPI Adult well exam and physical Patient is coming in for adult well exam and physical and blood pressure check.  He says his blood pressure was running in the 160s over diabetes and he increased his losartan up to 100 mg daily from 50 mg daily and says that he has been doing better with this and his blood pressure today is 138/77. Patient denies any chest pain, shortness of breath, headaches or vision issues, abdominal complaints, diarrhea, nausea, vomiting, or joint issues.   Type 2 diabetes and thyroid and cholesterol, these are normally managed by his endocrinologist but he is coming in today just because his blood pressure is a little bit elevated would like to discuss it with Frederick Robinson.  Relevant past medical, surgical, family and social history reviewed and updated as indicated. Interim medical history since our last visit reviewed. Allergies and medications reviewed and updated.  Review of Systems  Constitutional: Negative for chills and fever.  Eyes: Negative for discharge.  Respiratory: Negative for shortness of breath and wheezing.   Cardiovascular: Negative for chest pain and leg swelling.  Musculoskeletal: Negative for back pain and gait problem.  Skin: Negative for rash.  Neurological: Negative for dizziness, weakness and headaches.  All other systems reviewed and are negative.   Per HPI unless specifically indicated above   Allergies as of 02/15/2018      Reactions   Pravastatin Sodium    REACTION:  HIVES      Medication List        Accurate as of 02/15/18 11:52 AM. Always use your most recent med list.          aspirin 81 MG tablet Take 81 mg by mouth daily.   BD PEN NEEDLE NANO U/F 32G X 4 MM Misc Generic drug:  Insulin Pen Needle   CRESTOR 40 MG tablet Generic drug:  rosuvastatin Take 40 mg by mouth daily.   fenofibrate 145 MG tablet Commonly known as:  TRICOR Take 145 mg by mouth daily.   HUMALOG MIX 75/25 KWIKPEN (75-25) 100 UNIT/ML Kwikpen Generic drug:  Insulin Lispro Prot & Lispro Inject 75 Units into the skin daily.   ibuprofen 400 MG tablet Commonly known as:  ADVIL,MOTRIN Take 400 mg by mouth every 6 (six) hours as needed for pain.   INVOKANA 100 MG Tabs tablet Generic drug:  canagliflozin Take 1 tablet by mouth daily.   levothyroxine 125 MCG tablet Commonly known as:  SYNTHROID, LEVOTHROID Take 250 mcg by mouth daily before breakfast.   SYNTHROID 112 MCG tablet Generic drug:  levothyroxine Take 112 mcg by mouth daily before breakfast.   losartan 100 MG tablet Commonly known as:  COZAAR Take 1 tablet (100 mg total) by mouth daily.   metFORMIN 500 MG 24 hr tablet Commonly known as:  GLUCOPHAGE-XR Take 1,000 mg by mouth 2 (two) times daily.   ONETOUCH DELICA LANCETS 82X Misc 4 (  four) times daily. for testing   [facility] VERIO test strip Generic drug:  glucose blood 4 (four) times daily. for testing   sertraline 50 MG tablet Commonly known as:  ZOLOFT Take 50 mg by mouth daily.   sildenafil 20 MG tablet Commonly known as:  REVATIO Take 2-5 tablets as needed for sexual activity          Objective:    BP 138/77   Pulse (!) 54   Temp 98.5 F (36.9 C) (Oral)   Ht 6' 2.5" (1.892 m)   Wt 244 lb (110.7 kg)   BMI 30.91 kg/m   Wt Readings from Last 3 Encounters:  02/15/18 244 lb (110.7 kg)  08/31/15 263 lb 6.4 oz (119.5 kg)  08/06/15 264 lb (119.7 kg)    Physical Exam  Constitutional: He is oriented to person, place, and time.  He appears well-developed and well-nourished. No distress.  Eyes: Conjunctivae are normal. No scleral icterus.  Neck: Neck supple. No thyromegaly present.  Cardiovascular: Normal rate, regular rhythm, normal heart sounds and intact distal pulses.  No murmur heard. Pulmonary/Chest: Effort normal and breath sounds normal. No respiratory distress. He has no wheezes. He has no rales.  Musculoskeletal: Normal range of motion. He exhibits no edema.  Lymphadenopathy:    He has no cervical adenopathy.  Neurological: He is alert and oriented to person, place, and time. Coordination normal.  Skin: Skin is warm and dry. No rash noted. He is not diaphoretic.  Psychiatric: He has a normal mood and affect. His behavior is normal.  Nursing note and vitals reviewed.   Results for orders placed or performed in visit on 11/13/16  Hemoglobin A1c  Result Value Ref Range   Hemoglobin A1C 7.4       Assessment & Plan:   Problem List Items Addressed This Visit      Cardiovascular and Mediastinum   Hypertension associated with diabetes ([place])   Relevant Medications   rosuvastatin (CRESTOR) 40 MG tablet   losartan (COZAAR) 100 MG tablet   Other Relevant Orders   CMP14+EGFR     Endocrine   Hypothyroidism   Relevant Medications   levothyroxine (SYNTHROID) 112 MCG tablet   Other Relevant Orders   TSH   Diabetes mellitus, type 2 (HCC)   Relevant Medications   rosuvastatin (CRESTOR) 40 MG tablet   losartan (COZAAR) 100 MG tablet   Other Relevant Orders   Bayer DCA Hb A1c Waived   CMP14+EGFR   Hyperlipidemia associated with type 2 diabetes mellitus (HCC)   Relevant Medications   rosuvastatin (CRESTOR) 40 MG tablet   losartan (COZAAR) 100 MG tablet   Other Relevant Orders   Lipid panel    Other Visit Diagnoses    Well adult exam    -  Primary   Prostate cancer screening       Relevant Orders   PSA, total and free       Follow up plan: Return in about 1 year (around 02/16/2019), or if  symptoms worsen or fail to improve, for Adult well exam and physical.  Counseling provided for all of the vaccine components Orders Placed This Encounter  Procedures  . Pneumococcal polysaccharide vaccine 23-valent greater than or equal to 2yo subcutaneous/IM  . Tdap vaccine greater than or equal to 7yo IM  . TSH  . Bayer DCA Hb A1c Waived  . CMP14+EGFR  . PSA, total and free  . Lipid panel    Caryl Pina, MD [place]  02/15/2018, 11:52 AM

## 2018-02-16 LAB — TSH: TSH: 1.69 u[IU]/mL (ref 0.450–4.500)

## 2018-02-16 LAB — CMP14+EGFR
ALT: 32 IU/L (ref 0–44)
AST: 29 IU/L (ref 0–40)
Albumin/Globulin Ratio: 2.3 — ABNORMAL HIGH (ref 1.2–2.2)
Albumin: 5.1 g/dL (ref 3.5–5.5)
Alkaline Phosphatase: 44 IU/L (ref 39–117)
BILIRUBIN TOTAL: 0.2 mg/dL (ref 0.0–1.2)
BUN/Creatinine Ratio: 18 (ref 9–20)
BUN: 23 mg/dL (ref 6–24)
CALCIUM: 10.5 mg/dL — AB (ref 8.7–10.2)
CHLORIDE: 104 mmol/L (ref 96–106)
CO2: 21 mmol/L (ref 20–29)
Creatinine, Ser: 1.27 mg/dL (ref 0.76–1.27)
GFR calc non Af Amer: 62 mL/min/{1.73_m2} (ref >59)
GFR, EST AFRICAN AMERICAN: 72 mL/min/{1.73_m2} (ref >59)
GLUCOSE: 135 mg/dL — AB (ref 65–99)
Globulin, Total: 2.2 g/dL (ref 1.5–4.5)
Potassium: 5.4 mmol/L — ABNORMAL HIGH (ref 3.5–5.2)
Sodium: 142 mmol/L (ref 134–144)
TOTAL PROTEIN: 7.3 g/dL (ref 6.0–8.5)

## 2018-02-16 LAB — LIPID PANEL
CHOLESTEROL TOTAL: 263 mg/dL — AB (ref 100–199)
Chol/HDL Ratio: 5.4 ratio — ABNORMAL HIGH (ref 0.0–5.0)
HDL: 49 mg/dL (ref >39)
LDL Calculated: 159 mg/dL — ABNORMAL HIGH (ref 0–99)
Triglycerides: 277 mg/dL — ABNORMAL HIGH (ref 0–149)
VLDL CHOLESTEROL CAL: 55 mg/dL — AB (ref 5–40)

## 2018-02-16 LAB — PSA, TOTAL AND FREE
PSA FREE PCT: 40 %
PSA, Free: 0.32 ng/mL
Prostate Specific Ag, Serum: 0.8 ng/mL (ref 0.0–4.0)

## 2018-02-22 ENCOUNTER — Telehealth: Payer: Self-pay | Admitting: *Deleted

## 2018-02-22 MED ORDER — LOSARTAN POTASSIUM 100 MG PO TABS: 100.0000 mg | ORAL_TABLET | Freq: Every day | ORAL | 2 refills | Status: DC

## 2018-02-22 NOTE — Telephone Encounter (Signed)
Aware 90 day Rx sent to [facility]

## 2018-10-06 ENCOUNTER — Other Ambulatory Visit: Payer: Self-pay | Admitting: Family Medicine

## 2018-10-07 NOTE — Telephone Encounter (Signed)
Last seen 02/15/18

## 2018-12-30 ENCOUNTER — Other Ambulatory Visit: Payer: Self-pay | Admitting: Family

## 2018-12-31 NOTE — Telephone Encounter (Signed)
OV 02/15/18 rtc 1 yr

## 2020-07-16 DIAGNOSIS — E039 Hypothyroidism, unspecified: Secondary | ICD-10-CM | POA: Diagnosis not present

## 2020-07-16 DIAGNOSIS — I10 Essential (primary) hypertension: Secondary | ICD-10-CM | POA: Diagnosis not present

## 2020-07-16 DIAGNOSIS — E7800 Pure hypercholesterolemia, unspecified: Secondary | ICD-10-CM | POA: Diagnosis not present

## 2020-07-16 DIAGNOSIS — E1140 Type 2 diabetes mellitus with diabetic neuropathy, unspecified: Secondary | ICD-10-CM | POA: Diagnosis not present

## 2020-07-16 DIAGNOSIS — E1165 Type 2 diabetes mellitus with hyperglycemia: Secondary | ICD-10-CM | POA: Diagnosis not present

## 2020-08-28 DIAGNOSIS — E039 Hypothyroidism, unspecified: Secondary | ICD-10-CM | POA: Diagnosis not present

## 2021-11-06 ENCOUNTER — Ambulatory Visit: Payer: 59 | Admitting: Cardiology

## 2022-01-20 NOTE — Progress Notes (Signed)
Date:  01/21/2022   ID:  Frederick Robinson, DOB February 11, 1961, MRN [id number]  PCP:  Sharion Balloon, FNP  Cardiologist:  Rex Kras, DO, [facility]  (established care 01/21/2022)  REASON FOR CONSULT: Cardiac Evaluation.   REQUESTING PHYSICIAN:  Bindudal Balan,MD [address] [place], [place] [address]  Chief Complaint  Patient presents with   New Patient (Initial Visit)   CARDIAC RISK ASSESSMENT   Hypertension   Hyperlipidemia    HPI  Frederick Robinson is a 61 y.o. Caucasian male who presents to the office with a chief complaint of " cardiac evaluation." Patient's past medical history and cardiovascular risk factors include: Former smoker, hypertriglyceridemia, insulin-dependent diabetes mellitus type 2, hypertension, erectile dysfunction, hyperlipidemia.  He is referred to the office at the request of Dr. Chalmers Cater for evaluation of cardiac evaluation given his risk factors.  Recently patient has been noticing that with physical exertion he has neck and shoulder discomfort which usually resolves after resting.  He denies any precordial discomfort or heart failure symptoms.  Given his diabetes his endocrinologist was concerned that he may have atypical presentation of underlying coronary disease and therefore was referred for further evaluation and management.  Patient denies any change in overall physical endurance.  His blood pressure also has been uncontrolled in the recent past.  His blood pressures are currently being managed by his endocrinologist.  In the recent past his losartan was increased to 100 mg p.o. daily followed by the initiation of hydrochlorothiazide 25 mg p.o. daily.  His home systolic blood pressures are around 150 mmHg.  In his office blood pressure today's are not well controlled which may be contributory to whitecoat hypertension according to the patient.  Associated with high blood pressures are neck and jaw pain.  He usually notices higher blood pressure readings in the  morning.  Dad had his first MI in mid 22s but did well and passed away at age of 12.   FUNCTIONAL STATUS: Walks the dog 30 minute a day.    ALLERGIES: Allergies  Allergen Reactions   Pravastatin Sodium     REACTION: HIVES    MEDICATION LIST PRIOR TO VISIT: Current Meds  Medication Sig   aspirin 81 MG tablet Take 81 mg by mouth daily.   BD PEN NEEDLE NANO U/F 32G X 4 MM MISC    fenofibrate micronized (LOFIBRA) 200 MG capsule Take 200 mg by mouth daily.   ibuprofen (ADVIL,MOTRIN) 400 MG tablet Take 400 mg by mouth every 6 (six) hours as needed for pain.   isosorbide-hydrALAZINE (BIDIL) 20-37.5 MG tablet Take 1 tablet by mouth 3 (three) times daily.   levothyroxine (SYNTHROID) 175 MCG tablet Take 350 mcg by mouth daily.   loratadine (CLARITIN) 10 MG tablet Take 10 mg by mouth daily.   losartan-hydrochlorothiazide (HYZAAR) 100-25 MG tablet Take 1 tablet by mouth daily.   metFORMIN (GLUCOPHAGE-XR) 500 MG 24 hr tablet Take 2,000 mg by mouth 2 (two) times daily.   ONETOUCH DELICA LANCETS 34H MISC 4 (four) times daily. for testing   [facility] VERIO test strip 4 (four) times daily. for testing   rosuvastatin (CRESTOR) 40 MG tablet Take 40 mg by mouth daily.   TRESIBA FLEXTOUCH 200 UNIT/ML FlexTouch Pen Inject 50 Units into the skin in the morning and at bedtime.   [DISCONTINUED] sildenafil (REVATIO) 20 MG tablet Take 2-5 tablets as needed for sexual activity     PAST MEDICAL HISTORY: Past Medical History:  Diagnosis Date   Diabetes  mellitus without complication ([place])    Hyperlipidemia    Hypertension    Thyroid disease     PAST SURGICAL HISTORY: Past Surgical History:  Procedure Laterality Date   COLONOSCOPY N/A 11/07/2014   Procedure: COLONOSCOPY;  Surgeon: Jamesetta So, MD;  [facility];  Service: Gastroenterology;  Laterality: N/A;  830   KNEE SURGERY Right     FAMILY HISTORY: The patient family history includes Cancer in his father and mother; Cancer (age of  onset: 57) in his brother; Diabetes in his father; Heart attack in his father; Heart disease in his father.  SOCIAL HISTORY:  The patient  reports that he quit smoking about 12 years ago. His smoking use included cigars and cigarettes. He has a 50.00 pack-year smoking history. He has never used smokeless tobacco. He reports current alcohol use of about 10.0 standard drinks per week. He reports that he does not use drugs.  REVIEW OF SYSTEMS: Review of Systems  Constitutional: Negative for chills and fever.  HENT:  Negative for hoarse voice and nosebleeds.   Eyes:  Negative for discharge, double vision and pain.  Cardiovascular:  Negative for chest pain, claudication, dyspnea on exertion, leg swelling, near-syncope, orthopnea, palpitations, paroxysmal nocturnal dyspnea and syncope.       Jaw pain.  Respiratory:  Negative for hemoptysis and shortness of breath.   Musculoskeletal:  Negative for muscle cramps and myalgias.       Jaw pain  Gastrointestinal:  Negative for abdominal pain, constipation, diarrhea, hematemesis, hematochezia, melena, nausea and vomiting.  Neurological:  Positive for headaches. Negative for dizziness and light-headedness.   PHYSICAL EXAM: Vitals with BMI 01/21/2022 01/21/2022 02/15/2018  Height - 6' 2.5" 6' 2.5"  Weight - 245 lbs 6 oz 244 lbs  BMI - 16.9 67.89  Systolic 381 017 510  Diastolic 89 96 77  Pulse 66 73 54    CONSTITUTIONAL: Well-developed and well-nourished. No acute distress.  SKIN: Skin is warm and dry. No rash noted. No cyanosis. No pallor. No jaundice HEAD: Normocephalic and atraumatic.  EYES: No scleral icterus MOUTH/THROAT: Moist oral membranes.  NECK: No JVD present. No thyromegaly noted. No carotid bruits  LYMPHATIC: No visible cervical adenopathy.  CHEST Normal respiratory effort. No intercostal retractions  LUNGS: Clear to auscultation bilaterally.  No stridor. No wheezes. No rales.  CARDIOVASCULAR: Regular rate and rhythm, positive S1-S2, no  murmurs rubs or gallops appreciated.   ABDOMINAL: Soft, nontender, nondistended, positive bowel sounds in all 4 quadrants, no apparent ascites.  EXTREMITIES: No peripheral edema, warm to touch, 2+ bilateral DP and PT pulses HEMATOLOGIC: No significant bruising NEUROLOGIC: Oriented to person, place, and time. Nonfocal. Normal muscle tone.  PSYCHIATRIC: Normal mood and affect. Normal behavior. Cooperative  CARDIAC DATABASE: EKG: 01/21/2022: NSR, 64bpm, without underlying injury pain.   Echocardiogram: No results found for this or any previous visit from the past 1095 days.    Stress Testing: No results found for this or any previous visit from the past 1095 days.   Heart Catheterization: None  LABORATORY DATA: CBC Latest Ref Rng & Units 02/21/2009  Hemoglobin 13.0 - 17.0 g/dL 15.6  Hematocrit 39.0 - 52.0 % 46.0    CMP Latest Ref Rng & Units 02/15/2018 08/31/2015 08/28/2015  Glucose 65 - 99 mg/dL 135(H) 248(H) 232(H)  BUN 6 - 24 mg/dL $Remove'23 16 12  'HgWCWPW$ Creatinine 0.76 - 1.27 mg/dL 1.27 1.22 1.18  Sodium 134 - 144 mmol/L 142 139 140  Potassium 3.5 - 5.2 mmol/L 5.4(H)  5.0 4.8  Chloride 96 - 106 mmol/L 104 99 104  CO2 20 - 29 mmol/L $RemoveB'21 23 21  'QtycKoQY$ Calcium 8.7 - 10.2 mg/dL 10.5(H) 10.1 9.4  Total Protein 6.0 - 8.5 g/dL 7.3 - 6.6  Total Bilirubin 0.0 - 1.2 mg/dL 0.2 - 0.4  Alkaline Phos 39 - 117 IU/L 44 - 48  AST 0 - 40 IU/L 29 - 23  ALT 0 - 44 IU/L 32 - 38    Lipid Panel  Lab Results  Component Value Date   CHOL 263 (H) 02/15/2018   HDL 49 02/15/2018   LDLCALC 159 (H) 02/15/2018   LDLDIRECT 66.6 10/29/2009   TRIG 277 (H) 02/15/2018   CHOLHDL 5.4 (H) 02/15/2018     No components found for: NTPROBNP No results for input(s): PROBNP in the last 8760 hours. No results for input(s): TSH in the last 8760 hours.  BMP No results for input(s): NA, K, CL, CO2, GLUCOSE, BUN, CREATININE, CALCIUM, GFRNONAA, GFRAA in the last 8760 hours.  HEMOGLOBIN A1C Lab Results  Component Value Date    HGBA1C 6.6 02/15/2018   External Labs:  Date Collected: 07/30/2021 , information obtained by referring physician Potassium: 4.5 Creatinine 1.10 mg/dL. eGFR: 72 mL/min per 1.73 m Lipid profile: Total cholesterol 232 , triglycerides 384 , HDL 48 , LDL 107, non-HDL 184 AST: 40 , ALT: 55 , alkaline phosphatase: 56  Hemoglobin A1c: 8.7 TSH: 8.12    IMPRESSION:    ICD-10-CM   1. Pure hypercholesterolemia  E78.00 CT CARDIAC SCORING ([facility] LOCATIONS ONLY)    2. Jaw pain  R68.84 PCV CARDIAC STRESS TEST    3. Benign hypertension  I10 EKG 12-Lead    PCV ECHOCARDIOGRAM COMPLETE    Basic metabolic panel    Magnesium    isosorbide-hydrALAZINE (BIDIL) 20-37.5 MG tablet    4. Type 2 diabetes mellitus with hyperglycemia, with long-term current use of insulin (HCC)  E11.65 CT CARDIAC SCORING ([facility] LOCATIONS ONLY)   Z79.4        RECOMMENDATIONS: Frederick Robinson is a 61 y.o. Caucasian male whose past medical history and cardiac risk factors include: Former smoker, hypertriglyceridemia, insulin-dependent diabetes mellitus type 2, hypertension, erectile dysfunction, hyperlipidemia.  Patient was referred to the practice for cardiovascular evaluation given his multiple cardiovascular risk factors and exertional shoulder and neck pain.  His symptoms are not concerning for ACS but given his risk factors recommended an ischemic evaluation.  Check exercise treadmill stress test to evaluate for functional status and exercise-induced ischemia.  Echo will be ordered to evaluate for structural heart disease and left ventricular systolic function.  Coronary artery calcium score for further risk stratification.  With regards to blood pressure management patient is requesting assistance in his home blood pressures are not well controlled at today's office blood pressures are also not well controlled.  His office blood pressures may be secondary to whitecoat hypertension that he has experienced in the past.  He currently  does not have a primary care provider but will soon reestablish care.  Start BiDil 20/37.5 mg p.o. twice daily.  Patient is made aware of not using erectile dysfunction medication such as Viagra/sildenafil, Cialis/tadalafil, Levitra/vardenafil due to drug drug interactions.  Patient verbalizes understanding and provides feedback.  Patient is asked to call the office if his blood pressures are consistently greater than 140 mmHg despite the current medication changes.  Patient is also made aware that if he has any focal neurological deficits, vision changes, speech difficulties, facial asymmetry, intractable vomiting  should go to the hospital via EMS to be evaluated for stroke.  Patient verbalizes understanding.  Further recommendations to follow.   As part of today's office visit reviewed outside office notes provided by Dr. Chalmers Cater including independent review of labs, discussed disease management, ordered additional diagnostic testing, coordination of care discussed.  FINAL MEDICATION LIST END OF ENCOUNTER: Meds ordered this encounter  Medications   isosorbide-hydrALAZINE (BIDIL) 20-37.5 MG tablet    Sig: Take 1 tablet by mouth 3 (three) times daily.    Dispense:  270 tablet    Refill:  0    Medications Discontinued During This Encounter  Medication Reason   canagliflozin (INVOKANA) 100 MG TABS tablet Patient Preference   HUMALOG MIX 75/25 KWIKPEN (75-25) 100 UNIT/ML Kwikpen Change in therapy   sertraline (ZOLOFT) 50 MG tablet Patient Preference   sildenafil (REVATIO) 20 MG tablet    levothyroxine (SYNTHROID) 112 MCG tablet Patient Preference   levothyroxine (SYNTHROID, LEVOTHROID) 125 MCG tablet Patient Preference   losartan (COZAAR) 100 MG tablet Patient Preference     Current Outpatient Medications:    aspirin 81 MG tablet, Take 81 mg by mouth daily., Disp: , Rfl:    BD PEN NEEDLE NANO U/F 32G X 4 MM MISC, , Disp: , Rfl:    fenofibrate micronized (LOFIBRA) 200 MG capsule, Take 200  mg by mouth daily., Disp: , Rfl:    ibuprofen (ADVIL,MOTRIN) 400 MG tablet, Take 400 mg by mouth every 6 (six) hours as needed for pain., Disp: , Rfl:    isosorbide-hydrALAZINE (BIDIL) 20-37.5 MG tablet, Take 1 tablet by mouth 3 (three) times daily., Disp: 270 tablet, Rfl: 0   levothyroxine (SYNTHROID) 175 MCG tablet, Take 350 mcg by mouth daily., Disp: , Rfl:    loratadine (CLARITIN) 10 MG tablet, Take 10 mg by mouth daily., Disp: , Rfl:    losartan-hydrochlorothiazide (HYZAAR) 100-25 MG tablet, Take 1 tablet by mouth daily., Disp: , Rfl:    metFORMIN (GLUCOPHAGE-XR) 500 MG 24 hr tablet, Take 2,000 mg by mouth 2 (two) times daily., Disp: , Rfl: 5   ONETOUCH DELICA LANCETS 87F MISC, 4 (four) times daily. for testing, Disp: , Rfl: 11   ONETOUCH VERIO test strip, 4 (four) times daily. for testing, Disp: , Rfl: 11   rosuvastatin (CRESTOR) 40 MG tablet, Take 40 mg by mouth daily., Disp: , Rfl:    TRESIBA FLEXTOUCH 200 UNIT/ML FlexTouch Pen, Inject 50 Units into the skin in the morning and at bedtime., Disp: , Rfl:    fenofibrate (TRICOR) 145 MG tablet, Take 145 mg by mouth daily. , Disp: , Rfl:   Orders Placed This Encounter  Procedures   CT CARDIAC SCORING ([facility] LOCATIONS ONLY)   Basic metabolic panel   Magnesium   PCV CARDIAC STRESS TEST   EKG 12-Lead   PCV ECHOCARDIOGRAM COMPLETE    There are no Patient Instructions on file for this visit.   --Continue cardiac medications as reconciled in final medication list. --Return in about 4 weeks (around 02/18/2022) for Follow up BP, review test results. . Or sooner if needed. --Continue follow-up with your primary care physician regarding the management of your other chronic comorbid conditions.  Patient's questions and concerns were addressed to his satisfaction. He voices understanding of the instructions provided during this encounter.   This note was created using a voice recognition software as a result there may be grammatical errors  inadvertently enclosed that do not reflect the nature of this encounter. Every attempt is made  to correct such errors.  Rex Kras, [place], [facility]  Pager: [phone] Office: [phone]

## 2022-01-21 ENCOUNTER — Ambulatory Visit: Payer: 59 | Admitting: Cardiology

## 2022-01-21 ENCOUNTER — Encounter: Payer: Self-pay | Admitting: Cardiology

## 2022-01-21 ENCOUNTER — Other Ambulatory Visit: Payer: Self-pay

## 2022-01-21 VITALS — BP 178/89 | HR 66 | Temp 97.3°F | Resp 17 | Ht 74.5 in | Wt 245.4 lb

## 2022-01-21 DIAGNOSIS — Z794 Long term (current) use of insulin: Secondary | ICD-10-CM

## 2022-01-21 DIAGNOSIS — R6884 Jaw pain: Secondary | ICD-10-CM

## 2022-01-21 DIAGNOSIS — I10 Essential (primary) hypertension: Secondary | ICD-10-CM

## 2022-01-21 DIAGNOSIS — E1165 Type 2 diabetes mellitus with hyperglycemia: Secondary | ICD-10-CM

## 2022-01-21 DIAGNOSIS — E7800 Pure hypercholesterolemia, unspecified: Secondary | ICD-10-CM

## 2022-01-21 MED ORDER — ISOSORB DINITRATE-HYDRALAZINE 20-37.5 MG PO TABS: 1.0000 | ORAL_TABLET | Freq: Three times a day (TID) | ORAL | 0 refills | Status: DC

## 2022-01-27 ENCOUNTER — Other Ambulatory Visit: Payer: 59

## 2022-01-27 NOTE — Telephone Encounter (Signed)
From patient.

## 2022-01-28 NOTE — Telephone Encounter (Signed)
From pt

## 2022-02-03 ENCOUNTER — Other Ambulatory Visit: Payer: Self-pay

## 2022-02-03 ENCOUNTER — Ambulatory Visit: Payer: 59

## 2022-02-03 DIAGNOSIS — I10 Essential (primary) hypertension: Secondary | ICD-10-CM

## 2022-02-05 ENCOUNTER — Encounter: Payer: Self-pay | Admitting: Cardiology

## 2022-02-05 ENCOUNTER — Other Ambulatory Visit: Payer: Self-pay | Admitting: Cardiology

## 2022-02-05 DIAGNOSIS — I10 Essential (primary) hypertension: Secondary | ICD-10-CM

## 2022-02-05 MED ORDER — AMLODIPINE BESYLATE 10 MG PO TABS: 10.0000 mg | ORAL_TABLET | Freq: Every day | ORAL | 0 refills | Status: DC

## 2022-02-05 NOTE — Telephone Encounter (Signed)
From patient.

## 2022-02-17 ENCOUNTER — Other Ambulatory Visit: Payer: Self-pay

## 2022-02-17 DIAGNOSIS — I10 Essential (primary) hypertension: Secondary | ICD-10-CM

## 2022-02-24 ENCOUNTER — Ambulatory Visit: Payer: 59 | Admitting: Cardiology

## 2022-02-25 ENCOUNTER — Inpatient Hospital Stay: Admission: RE | Admit: 2022-02-25 | Payer: Self-pay | Source: Ambulatory Visit

## 2022-02-25 ENCOUNTER — Ambulatory Visit
Admission: RE | Admit: 2022-02-25 | Discharge: 2022-02-25 | Disposition: A | Discharge status: Home / Self Care | Payer: No Typology Code available for payment source | Source: Ambulatory Visit | Attending: Cardiology | Admitting: Cardiology

## 2022-02-25 DIAGNOSIS — E1165 Type 2 diabetes mellitus with hyperglycemia: Secondary | ICD-10-CM

## 2022-02-25 DIAGNOSIS — E7800 Pure hypercholesterolemia, unspecified: Secondary | ICD-10-CM

## 2022-03-03 ENCOUNTER — Encounter: Payer: Self-pay | Admitting: Cardiology

## 2022-03-03 ENCOUNTER — Other Ambulatory Visit: Payer: Self-pay | Admitting: Cardiology

## 2022-03-03 ENCOUNTER — Ambulatory Visit: Payer: 59 | Admitting: Cardiology

## 2022-03-03 VITALS — BP 141/74 | HR 79 | Temp 98.2°F | Resp 16 | Ht 74.5 in | Wt 240.8 lb

## 2022-03-03 DIAGNOSIS — E1165 Type 2 diabetes mellitus with hyperglycemia: Secondary | ICD-10-CM

## 2022-03-03 DIAGNOSIS — E7800 Pure hypercholesterolemia, unspecified: Secondary | ICD-10-CM

## 2022-03-03 DIAGNOSIS — I10 Essential (primary) hypertension: Secondary | ICD-10-CM

## 2022-03-03 DIAGNOSIS — E781 Pure hyperglyceridemia: Secondary | ICD-10-CM

## 2022-03-03 DIAGNOSIS — I2584 Coronary atherosclerosis due to calcified coronary lesion: Secondary | ICD-10-CM

## 2022-03-03 MED ORDER — CHLORTHALIDONE 25 MG PO TABS: 25.0000 mg | ORAL_TABLET | Freq: Every morning | ORAL | 2 refills | Status: DC

## 2022-03-03 MED ORDER — LOSARTAN POTASSIUM 100 MG PO TABS: 100.0000 mg | ORAL_TABLET | Freq: Every day | ORAL | 0 refills | Status: DC

## 2022-03-03 MED ORDER — CARVEDILOL 12.5 MG PO TABS: 12.5000 mg | ORAL_TABLET | Freq: Two times a day (BID) | ORAL | 0 refills | Status: DC

## 2022-03-03 MED ORDER — REPATHA SURECLICK 140 MG/ML ~~LOC~~ SOAJ: 140.0000 mg | SUBCUTANEOUS | 3 refills | Status: DC

## 2022-03-03 NOTE — Progress Notes (Signed)
? ?Date:  03/03/2022  ? ?ID:  Frederick Robinson, DOB May 08, 1961, MRN [id number] ? ?Date: 03/03/22 ?Last Office Visit: 01/21/2022 ? ?Chief Complaint  ?Patient presents with  ? Hypertension  ? Results  ? Follow-up  ?  4 weeks  ? ? ?HPI  ?Frederick Robinson is a 61 y.o. Caucasian male whose past medical history and cardiovascular risk factors include: Severe coronary artery calcification, former smoker, hypertriglyceridemia, insulin-dependent diabetes mellitus type 2, hypertension, erectile dysfunction, hyperlipidemia. ? ?Patient was referred to the practice at the request of Dr. Chalmers Cater for CAD evaluation given his multiple cardiovascular risk factors.  At last office visit patient also requested assistance with blood pressure management as his systolic blood pressures were running greater than 150 mmHg at home and had providers offices as high as 180 mmHg.   ? ?At last office visit the plan was to add BiDil and undergo echocardiogram and coronary calcium score for further risk stratification.  Patient has tolerated the initiation of BiDil well without any side effects or intolerances.  His home blood pressures are still not at goal but improving.  Morning blood pressures are between 150-160 mmHg and p.m. blood pressures are usually around 115-125 mmHg.  Patient states that his headaches have essentially resolved. ? ?His coronary calcium score is 628 placing him at the 92nd percentile.  He denies angina pectoris or heart failure symptoms. ? ?Dad had his first MI in mid 63s but did well and passed away at age of 47.  ? ?FUNCTIONAL STATUS: ?Walks the dog 30 minute a day.   ? ?ALLERGIES: ?Allergies  ?Allergen Reactions  ? Pravastatin Sodium   ?  REACTION: HIVES  ? ? ?MEDICATION LIST PRIOR TO VISIT: ?Current Meds  ?Medication Sig  ? amLODipine (NORVASC) 10 MG tablet Take 1 tablet (10 mg total) by mouth daily at 10 pm.  ? aspirin 81 MG tablet Take 81 mg by mouth daily.  ? BD PEN NEEDLE NANO U/F 32G X 4 MM MISC   ? carvedilol (COREG) 12.5  MG tablet Take 1 tablet (12.5 mg total) by mouth 2 (two) times daily with a meal.  ? chlorthalidone (HYGROTON) 25 MG tablet Take 1 tablet (25 mg total) by mouth every morning.  ? Evolocumab (REPATHA SURECLICK) 338 MG/ML SOAJ Inject 140 mg into the skin every 14 (fourteen) days for 6 doses.  ? fenofibrate micronized (LOFIBRA) 200 MG capsule Take 200 mg by mouth daily.  ? ibuprofen (ADVIL,MOTRIN) 400 MG tablet Take 400 mg by mouth every 6 (six) hours as needed for pain.  ? isosorbide-hydrALAZINE (BIDIL) 20-37.5 MG tablet Take 1 tablet by mouth 3 (three) times daily.  ? levothyroxine (SYNTHROID) 175 MCG tablet Take 350 mcg by mouth daily.  ? loratadine (CLARITIN) 10 MG tablet Take 10 mg by mouth daily.  ? losartan (COZAAR) 100 MG tablet Take 1 tablet (100 mg total) by mouth daily at 10 pm.  ? metFORMIN (GLUCOPHAGE-XR) 500 MG 24 hr tablet Take 2,000 mg by mouth 2 (two) times daily.  ? ONETOUCH DELICA LANCETS 25K MISC 4 (four) times daily. for testing  ? ONETOUCH VERIO test strip 4 (four) times daily. for testing  ? rosuvastatin (CRESTOR) 40 MG tablet Take 40 mg by mouth daily.  ? TRESIBA FLEXTOUCH 200 UNIT/ML FlexTouch Pen Inject 50 Units into the skin in the morning and at bedtime.  ? [DISCONTINUED] losartan-hydrochlorothiazide (HYZAAR) 100-25 MG tablet Take 1 tablet by mouth daily.  ?  ? ?PAST MEDICAL HISTORY: ?Past Medical History:  ?  Diagnosis Date  ? Diabetes mellitus without complication ([place])   ? Hyperlipidemia   ? Hypertension   ? Thyroid disease   ? ? ?PAST SURGICAL HISTORY: ?Past Surgical History:  ?Procedure Laterality Date  ? COLONOSCOPY N/A 11/07/2014  ? Procedure: COLONOSCOPY;  Surgeon: Jamesetta So, MD;  [facility];  Service: Gastroenterology;  Laterality: N/A;  830  ? KNEE SURGERY Right   ? ? ?FAMILY HISTORY: ?The patient family history includes Cancer in his father and mother; Cancer (age of onset: 41) in his brother; Diabetes in his father; Heart attack in his father; Heart disease in his  father. ? ?SOCIAL HISTORY:  ?The patient  reports that he quit smoking about 12 years ago. His smoking use included cigars and cigarettes. He has a 50.00 pack-year smoking history. He has never used smokeless tobacco. He reports current alcohol use of about 10.0 standard drinks per week. He reports that he does not use drugs. ? ?REVIEW OF SYSTEMS: ?Review of Systems  ?Constitutional: Negative for chills and fever.  ?HENT:  Negative for hoarse voice and nosebleeds.   ?Eyes:  Negative for discharge, double vision and pain.  ?Cardiovascular:  Negative for chest pain, claudication, dyspnea on exertion, leg swelling, near-syncope, orthopnea, palpitations, paroxysmal nocturnal dyspnea and syncope.  ?Respiratory:  Negative for hemoptysis and shortness of breath.   ?Musculoskeletal:  Negative for muscle cramps and myalgias.  ?Gastrointestinal:  Negative for abdominal pain, constipation, diarrhea, hematemesis, hematochezia, melena, nausea and vomiting.  ?Neurological:  Negative for dizziness, headaches and light-headedness.  ? ?PHYSICAL EXAM: ? ?  03/03/2022  ?  2:39 PM 01/21/2022  ? 10:05 AM 01/21/2022  ?  9:50 AM  ?Vitals with BMI  ?Height 6' 2.5"  6' 2.5"  ?Weight 240 lbs 13 oz  245 lbs 6 oz  ?BMI 30.51  31.1  ?Systolic 062 694 854  ?Diastolic 74 89 96  ?Pulse 79 66 73  ? ? ?CONSTITUTIONAL: Well-developed and well-nourished. No acute distress.  ?SKIN: Skin is warm and dry. No rash noted. No cyanosis. No pallor. No jaundice ?HEAD: Normocephalic and atraumatic.  ?EYES: No scleral icterus ?MOUTH/THROAT: Moist oral membranes.  ?NECK: No JVD present. No thyromegaly noted. No carotid bruits  ?LYMPHATIC: No visible cervical adenopathy.  ?CHEST Normal respiratory effort. No intercostal retractions  ?LUNGS: Clear to auscultation bilaterally.  No stridor. No wheezes. No rales.  ?CARDIOVASCULAR: Regular rate and rhythm, positive S1-S2, no murmurs rubs or gallops appreciated.   ?ABDOMINAL: Soft, nontender, nondistended, positive bowel  sounds in all 4 quadrants, no apparent ascites.  ?EXTREMITIES: No peripheral edema, warm to touch, 2+ bilateral DP and PT pulses ?HEMATOLOGIC: No significant bruising ?NEUROLOGIC: Oriented to person, place, and time. Nonfocal. Normal muscle tone.  ?PSYCHIATRIC: Normal mood and affect. Normal behavior. Cooperative ? ?CARDIAC DATABASE: ?EKG: ?01/21/2022: NSR, 64bpm, without underlying injury pain.  ? ?Echocardiogram: ?02/03/2022: ?Left ventricle cavity is normal in size. Mild concentric hypertrophy of the left ventricle. Normal global wall motion. Normal LV systolic function with EF 64%. Normal diastolic filling pattern. Mildly increased LVOT velocity. No significant valvular stenosis. ?No significant valvular stenosis. ?Normal right atrial pressure. ?  ?Stress Testing: ?No results found for this or any previous visit from the past 1095 days. ? ? ?Heart Catheterization: ?None ? ?Calcium Scoring.  ?02/25/2022: ?Left Main: 33.8 ?LAD: 239 ?LCx: 62.2 ?RCA: 292 ?Total Agatston Score: 628 ?  ?MESA database percentile: 92nd ?Three-vessel coronary atherosclerotic calcifications. The observed calcium score of 628 is at the 92nd percentile for subjects  of the same age, gender, and race/ethnicity who are free of clinical cardiovascular disease and treated diabetes. ? ?LABORATORY DATA: ? ?  Latest Ref Rng & Units 02/21/2009  ? 11:37 AM  ?CBC  ?Hemoglobin 13.0 - 17.0 g/dL 15.6    ?Hematocrit 39.0 - 52.0 % 46.0    ? ? ? ?  Latest Ref Rng & Units 02/15/2018  ? 11:18 AM 08/31/2015  ?  5:08 PM 08/28/2015  ?  8:25 AM  ?CMP  ?Glucose 65 - 99 mg/dL 135   248   232    ?BUN 6 - 24 mg/dL '23   16   12    '$ ?Creatinine 0.76 - 1.27 mg/dL 1.27   1.22   1.18    ?Sodium 134 - 144 mmol/L 142   139   140    ?Potassium 3.5 - 5.2 mmol/L 5.4   5.0   4.8    ?Chloride 96 - 106 mmol/L 104   99   104    ?CO2 20 - 29 mmol/L '21   23   21    '$ ?Calcium 8.7 - 10.2 mg/dL 10.5   10.1   9.4    ?Total Protein 6.0 - 8.5 g/dL 7.3    6.6    ?Total Bilirubin 0.0 - 1.2 mg/dL  0.2    0.4    ?Alkaline Phos 39 - 117 IU/L 44    48    ?AST 0 - 40 IU/L 29    23    ?ALT 0 - 44 IU/L 32    38    ? ? ?Lipid Panel  ?Lab Results  ?Component Value Date  ? CHOL 263 (H) 02/15/2018  ? HDL 4

## 2022-03-10 ENCOUNTER — Other Ambulatory Visit: Payer: Self-pay

## 2022-03-10 DIAGNOSIS — I10 Essential (primary) hypertension: Secondary | ICD-10-CM

## 2022-03-20 ENCOUNTER — Ambulatory Visit: Payer: 59

## 2022-03-20 DIAGNOSIS — I10 Essential (primary) hypertension: Secondary | ICD-10-CM

## 2022-03-21 LAB — BASIC METABOLIC PANEL
BUN/Creatinine Ratio: 20 (ref 10–24)
BUN: 20 mg/dL (ref 8–27)
CO2: 21 mmol/L (ref 20–29)
Calcium: 10.4 mg/dL — ABNORMAL HIGH (ref 8.6–10.2)
Chloride: 107 mmol/L — ABNORMAL HIGH (ref 96–106)
Creatinine, Ser: 0.98 mg/dL (ref 0.76–1.27)
Glucose: 115 mg/dL — ABNORMAL HIGH (ref 70–99)
Potassium: 4.4 mmol/L (ref 3.5–5.2)
Sodium: 142 mmol/L (ref 134–144)
eGFR: 87 mL/min/{1.73_m2} (ref >59)

## 2022-03-21 LAB — MAGNESIUM: Magnesium: 2.1 mg/dL (ref 1.6–2.3)

## 2022-03-25 ENCOUNTER — Other Ambulatory Visit: Payer: Self-pay

## 2022-04-01 ENCOUNTER — Encounter: Payer: Self-pay | Admitting: Cardiology

## 2022-04-01 ENCOUNTER — Ambulatory Visit: Payer: 59 | Admitting: Cardiology

## 2022-04-01 VITALS — BP 142/67 | HR 66 | Temp 98.0°F | Resp 17 | Ht 75.0 in | Wt 246.0 lb

## 2022-04-01 DIAGNOSIS — I2584 Coronary atherosclerosis due to calcified coronary lesion: Secondary | ICD-10-CM

## 2022-04-01 DIAGNOSIS — E1165 Type 2 diabetes mellitus with hyperglycemia: Secondary | ICD-10-CM

## 2022-04-01 DIAGNOSIS — E781 Pure hyperglyceridemia: Secondary | ICD-10-CM

## 2022-04-01 DIAGNOSIS — I10 Essential (primary) hypertension: Secondary | ICD-10-CM

## 2022-04-01 DIAGNOSIS — E7800 Pure hypercholesterolemia, unspecified: Secondary | ICD-10-CM

## 2022-04-01 MED ORDER — AMLODIPINE BESYLATE 10 MG PO TABS: 10.0000 mg | ORAL_TABLET | Freq: Every day | ORAL | 0 refills | Status: DC

## 2022-04-01 MED ORDER — CHLORTHALIDONE 25 MG PO TABS: 25.0000 mg | ORAL_TABLET | Freq: Every morning | ORAL | 2 refills | Status: DC

## 2022-04-01 MED ORDER — CARVEDILOL 12.5 MG PO TABS
12.5000 mg | ORAL_TABLET | Freq: Two times a day (BID) | ORAL | 0 refills | Status: DC
Start: 2022-04-01 — End: 2022-08-29

## 2022-04-01 MED ORDER — ISOSORB DINITRATE-HYDRALAZINE 20-37.5 MG PO TABS: 1.0000 | ORAL_TABLET | Freq: Three times a day (TID) | ORAL | 0 refills | Status: DC

## 2022-04-01 NOTE — Progress Notes (Signed)
? ?Date:  04/01/2022  ? ?ID:  Frederick Robinson, DOB 09-Apr-1961, MRN [id number] ? ?Date: 04/01/22 ?Last Office Visit: 03/03/2022 ? ?Chief Complaint  ?Patient presents with  ? Hypertension  ? Follow-up  ?  4 weeks  ? ? ?HPI  ?Frederick Robinson is a 61 y.o. Caucasian male whose past medical history and cardiovascular risk factors include: Severe coronary artery calcification, former smoker, hypertriglyceridemia, insulin-dependent diabetes mellitus type 2, hypertension, erectile dysfunction, hyperlipidemia. ? ?Patient was referred to the practice at the request of Dr. Chalmers Cater for CAD evaluation given his multiple cardiovascular risk factors.  During prior visits he recommended assistance with blood pressure management as well as his home SBP would be 150 mmHg or greater in office blood pressures to be greater than 180 mmHg.  ? ?In the last office visit uptitrated his antihypertensive medications and his home blood pressures are now ranging between 125-140 mmHg.  He was also recommended to see sleep medicine to be evaluated for possible sleep apnea given his comorbidities, blood pressure, and morning blood pressures being higher than the evening readings.  This is still pending. ? ?His coronary artery calcium score is 628 placing him at the 92nd percentile.  Given his lipids from March 2023 and comorbidities of severe CAC, uncontrolled hypertriglyceridemia, insulin-dependent diabetes, and hyperlipidemia recommended PCSK9 inhibitor.  Patient informs me that Repatha was denied by his insurance company. ? ?He is working with his endocrinologist for management of thyroid and diabetes and is hopeful that numbers will improve upon rechecking. ? ? ?FUNCTIONAL STATUS: ?Walks the dog 30 minute a day.   ? ?ALLERGIES: ?Allergies  ?Allergen Reactions  ? Pravastatin Sodium   ?  REACTION: HIVES  ? ? ?MEDICATION LIST PRIOR TO VISIT: ?Current Meds  ?Medication Sig  ? aspirin 81 MG tablet Take 81 mg by mouth daily.  ? BD PEN NEEDLE NANO U/F 32G X 4  MM MISC   ? fenofibrate micronized (LOFIBRA) 200 MG capsule Take 200 mg by mouth daily.  ? ibuprofen (ADVIL,MOTRIN) 400 MG tablet Take 400 mg by mouth every 6 (six) hours as needed for pain.  ? levothyroxine (SYNTHROID) 175 MCG tablet Take 350 mcg by mouth daily.  ? loratadine (CLARITIN) 10 MG tablet Take 10 mg by mouth daily.  ? losartan (COZAAR) 100 MG tablet Take 1 tablet (100 mg total) by mouth daily at 10 pm.  ? metFORMIN (GLUCOPHAGE-XR) 500 MG 24 hr tablet Take 2,000 mg by mouth 2 (two) times daily.  ? ONETOUCH DELICA LANCETS 43X MISC 4 (four) times daily. for testing  ? ONETOUCH VERIO test strip 4 (four) times daily. for testing  ? rosuvastatin (CRESTOR) 40 MG tablet Take 40 mg by mouth daily.  ? Semaglutide, 1 MG/DOSE, (OZEMPIC, 1 MG/DOSE,) 4 MG/3ML SOPN Inject into the skin.  ? TRESIBA FLEXTOUCH 200 UNIT/ML FlexTouch Pen Inject 50 Units into the skin in the morning and at bedtime.  ? [DISCONTINUED] amLODipine (NORVASC) 10 MG tablet Take 1 tablet (10 mg total) by mouth daily at 10 pm.  ? [DISCONTINUED] carvedilol (COREG) 12.5 MG tablet Take 1 tablet (12.5 mg total) by mouth 2 (two) times daily with a meal.  ? [DISCONTINUED] chlorthalidone (HYGROTON) 25 MG tablet Take 1 tablet (25 mg total) by mouth every morning.  ? [DISCONTINUED] isosorbide-hydrALAZINE (BIDIL) 20-37.5 MG tablet Take 1 tablet by mouth 3 (three) times daily.  ?  ? ?PAST MEDICAL HISTORY: ?Past Medical History:  ?Diagnosis Date  ? Diabetes mellitus without complication ([place])   ?  Hyperlipidemia   ? Hypertension   ? Thyroid disease   ? ? ?PAST SURGICAL HISTORY: ?Past Surgical History:  ?Procedure Laterality Date  ? COLONOSCOPY N/A 11/07/2014  ? Procedure: COLONOSCOPY;  Surgeon: Jamesetta So, MD;  Location: [facility];  Service: Gastroenterology;  Laterality: N/A;  830  ? KNEE SURGERY Right   ? ? ?FAMILY HISTORY: ?The patient family history includes Cancer in his father and mother; Cancer (age of onset: 22) in his brother; Diabetes in his  father; Heart attack in his father; Heart disease in his father. ? ?SOCIAL HISTORY:  ?The patient  reports that he quit smoking about 12 years ago. His smoking use included cigars and cigarettes. He has a 50.00 pack-year smoking history. He has never used smokeless tobacco. He reports current alcohol use of about 10.0 standard drinks per week. He reports that he does not use drugs. ? ?REVIEW OF SYSTEMS: ?Review of Systems  ?Constitutional: Negative for chills and fever.  ?HENT:  Negative for hoarse voice and nosebleeds.   ?Eyes:  Negative for discharge, double vision and pain.  ?Cardiovascular:  Negative for chest pain, claudication, dyspnea on exertion, leg swelling, near-syncope, orthopnea, palpitations, paroxysmal nocturnal dyspnea and syncope.  ?Respiratory:  Negative for hemoptysis and shortness of breath.   ?Musculoskeletal:  Negative for muscle cramps and myalgias.  ?Gastrointestinal:  Negative for abdominal pain, constipation, diarrhea, hematemesis, hematochezia, melena, nausea and vomiting.  ?Neurological:  Negative for dizziness, headaches and light-headedness.  ? ?PHYSICAL EXAM: ? ?  04/01/2022  ? 12:16 PM 04/01/2022  ? 11:43 AM 04/01/2022  ? 11:38 AM  ?Vitals with BMI  ?Height   '6\' 3"'$   ?Weight   246 lbs  ?BMI   30.75  ?Systolic [phone]  ?Diastolic 67 74 72  ?Pulse 66 56 66  ? ? ?CONSTITUTIONAL: Well-developed and well-nourished. No acute distress.  ?SKIN: Skin is warm and dry. No rash noted. No cyanosis. No pallor. No jaundice.  Arcus senilis. ?HEAD: Normocephalic and atraumatic.  ?EYES: No scleral icterus ?MOUTH/THROAT: Moist oral membranes.  ?NECK: No JVD present. No thyromegaly noted. No carotid bruits  ?LYMPHATIC: No visible cervical adenopathy.  ?CHEST Normal respiratory effort. No intercostal retractions  ?LUNGS: Clear to auscultation bilaterally.  No stridor. No wheezes. No rales.  ?CARDIOVASCULAR: Regular rate and rhythm, positive S1-S2, no murmurs rubs or gallops appreciated.   ?ABDOMINAL:  Soft, nontender, nondistended, positive bowel sounds in all 4 quadrants, no apparent ascites.  ?EXTREMITIES: Localized pedal edema bilaterally, warm to touch, 2+ bilateral DP and PT pulses ?HEMATOLOGIC: No significant bruising ?NEUROLOGIC: Oriented to person, place, and time. Nonfocal. Normal muscle tone.  ?PSYCHIATRIC: Normal mood and affect. Normal behavior. Cooperative ? ?CARDIAC DATABASE: ?EKG: ?01/21/2022: NSR, 64bpm, without underlying injury pain.  ? ?Echocardiogram: ?02/03/2022: ?Left ventricle cavity is normal in size. Mild concentric hypertrophy of the left ventricle. Normal global wall motion. Normal LV systolic function with EF 64%. Normal diastolic filling pattern. Mildly increased LVOT velocity. No significant valvular stenosis. ?No significant valvular stenosis. ?Normal right atrial pressure. ?  ?Stress Testing: ?No results found for this or any previous visit from the past 1095 days. ? ? ?Heart Catheterization: ?None ? ?Calcium Scoring.  ?02/25/2022: ?Left Main: 33.8 ?LAD: 239 ?LCx: 62.2 ?RCA: 292 ?Total Agatston Score: 628 ?  ?MESA database percentile: 92nd ?Three-vessel coronary atherosclerotic calcifications. The observed calcium score of 628 is at the 92nd percentile for subjects of the same age, gender, and race/ethnicity who are free of clinical cardiovascular disease and treated  diabetes. ? ?Renal artery duplex  03/20/2022:  ?No evidence of renal artery occlusive disease in either renal artery. Mild increase in bilateral resistivity index and suggests medico-renal disease.  ?Renal length is within normal limits for both kidneys.  ?Normal abdominal aorta flow velocities noted. ? ?LABORATORY DATA: ? ?  Latest Ref Rng & Units 02/21/2009  ? 11:37 AM  ?CBC  ?Hemoglobin 13.0 - 17.0 g/dL 15.6    ?Hematocrit 39.0 - 52.0 % 46.0    ? ? ? ?  Latest Ref Rng & Units 03/20/2022  ?  9:29 AM 02/15/2018  ? 11:18 AM 08/31/2015  ?  5:08 PM  ?CMP  ?Glucose 70 - 99 mg/dL 115   135   248    ?BUN 8 - 27 mg/dL '20   23   16     '$ ?Creatinine 0.76 - 1.27 mg/dL 0.98   1.27   1.22    ?Sodium 134 - 144 mmol/L 142   142   139    ?Potassium 3.5 - 5.2 mmol/L 4.4   5.4   5.0    ?Chloride 96 - 106 mmol/L 107   104   99    ?CO2 20 - 29 mmol

## 2022-06-12 ENCOUNTER — Encounter: Payer: Self-pay | Admitting: Cardiology

## 2022-06-12 NOTE — Telephone Encounter (Signed)
From pt

## 2022-06-13 ENCOUNTER — Other Ambulatory Visit (HOSPITAL_COMMUNITY): Payer: Self-pay | Admitting: Otolaryngology

## 2022-06-13 ENCOUNTER — Ambulatory Visit (HOSPITAL_COMMUNITY)
Admission: RE | Admit: 2022-06-13 | Discharge: 2022-06-13 | Disposition: A | Discharge status: Home / Self Care | Payer: 59 | Source: Ambulatory Visit | Attending: Cardiology | Admitting: Cardiology

## 2022-06-13 DIAGNOSIS — M79604 Pain in right leg: Secondary | ICD-10-CM | POA: Diagnosis present

## 2022-06-13 DIAGNOSIS — M79661 Pain in right lower leg: Secondary | ICD-10-CM

## 2022-06-13 DIAGNOSIS — M79605 Pain in left leg: Secondary | ICD-10-CM

## 2022-06-13 DIAGNOSIS — M79662 Pain in left lower leg: Secondary | ICD-10-CM | POA: Insufficient documentation

## 2022-06-17 ENCOUNTER — Other Ambulatory Visit: Payer: Self-pay | Admitting: Cardiology

## 2022-06-17 DIAGNOSIS — I10 Essential (primary) hypertension: Secondary | ICD-10-CM

## 2022-06-25 ENCOUNTER — Ambulatory Visit: Payer: 59

## 2022-06-25 DIAGNOSIS — E7800 Pure hypercholesterolemia, unspecified: Secondary | ICD-10-CM

## 2022-06-25 DIAGNOSIS — E781 Pure hyperglyceridemia: Secondary | ICD-10-CM

## 2022-06-25 DIAGNOSIS — E1165 Type 2 diabetes mellitus with hyperglycemia: Secondary | ICD-10-CM

## 2022-06-25 DIAGNOSIS — I2584 Coronary atherosclerosis due to calcified coronary lesion: Secondary | ICD-10-CM

## 2022-06-30 ENCOUNTER — Encounter: Payer: Self-pay | Admitting: Cardiology

## 2022-06-30 NOTE — Telephone Encounter (Signed)
From patient.

## 2022-07-01 NOTE — Progress Notes (Signed)
External Labs: Collected: 06/25/2022 provided by PCP. Hemoglobin A1c 6.0 Total cholesterol 167, triglycerides 316, HDL 36, LDL 67.8 TSH 0.052 (below normal limits)  Results reviewed with the patient over the phone.  Armelia Penton [place], DO, [facility]

## 2022-07-02 ENCOUNTER — Telehealth: Payer: Self-pay | Admitting: Cardiology

## 2022-07-02 ENCOUNTER — Other Ambulatory Visit: Payer: Self-pay

## 2022-07-02 MED ORDER — REPATHA SURECLICK 140 MG/ML ~~LOC~~ SOAJ: 140.0000 mg | SUBCUTANEOUS | 3 refills | Status: DC

## 2022-07-02 NOTE — Telephone Encounter (Signed)
Phone call was disconnected, so I left mychart message to schedule a follow up w/Dr. Terri Skains in November per [place]/ST. Voicemail full. Please advise.

## 2022-07-02 NOTE — Progress Notes (Signed)
Called and spoke to patient. Call got disconnected when I was going to transfer to front desk to get him an appt scheduled. Patient stated he does not have repatha I will try to get a PA done and see if he gets covered

## 2022-07-02 NOTE — Telephone Encounter (Signed)
Patient has been scheduled for 09/29/22

## 2022-07-15 ENCOUNTER — Other Ambulatory Visit: Payer: Self-pay | Admitting: Cardiology

## 2022-07-15 DIAGNOSIS — I10 Essential (primary) hypertension: Secondary | ICD-10-CM

## 2022-08-13 ENCOUNTER — Encounter: Payer: Self-pay | Admitting: Cardiology

## 2022-08-21 NOTE — Telephone Encounter (Signed)
From Patient

## 2022-08-26 ENCOUNTER — Emergency Department (HOSPITAL_BASED_OUTPATIENT_CLINIC_OR_DEPARTMENT_OTHER)
Admission: EM | Admit: 2022-08-26 | Discharge: 2022-08-26 | Disposition: A | Discharge status: Home / Self Care | Payer: 59 | Attending: Emergency Medicine | Admitting: Emergency Medicine

## 2022-08-26 ENCOUNTER — Encounter (HOSPITAL_BASED_OUTPATIENT_CLINIC_OR_DEPARTMENT_OTHER): Payer: Self-pay | Admitting: Emergency Medicine

## 2022-08-26 ENCOUNTER — Other Ambulatory Visit: Payer: Self-pay

## 2022-08-26 ENCOUNTER — Encounter: Payer: Self-pay | Admitting: Cardiology

## 2022-08-26 ENCOUNTER — Telehealth: Payer: 59 | Admitting: Nurse Practitioner

## 2022-08-26 ENCOUNTER — Emergency Department (HOSPITAL_BASED_OUTPATIENT_CLINIC_OR_DEPARTMENT_OTHER): Payer: 59

## 2022-08-26 DIAGNOSIS — Z7982 Long term (current) use of aspirin: Secondary | ICD-10-CM | POA: Insufficient documentation

## 2022-08-26 DIAGNOSIS — E119 Type 2 diabetes mellitus without complications: Secondary | ICD-10-CM | POA: Diagnosis not present

## 2022-08-26 DIAGNOSIS — R7989 Other specified abnormal findings of blood chemistry: Secondary | ICD-10-CM | POA: Diagnosis not present

## 2022-08-26 DIAGNOSIS — R944 Abnormal results of kidney function studies: Secondary | ICD-10-CM | POA: Diagnosis not present

## 2022-08-26 DIAGNOSIS — R0602 Shortness of breath: Secondary | ICD-10-CM | POA: Diagnosis not present

## 2022-08-26 DIAGNOSIS — I10 Essential (primary) hypertension: Secondary | ICD-10-CM | POA: Diagnosis not present

## 2022-08-26 DIAGNOSIS — Z79899 Other long term (current) drug therapy: Secondary | ICD-10-CM | POA: Diagnosis not present

## 2022-08-26 DIAGNOSIS — D649 Anemia, unspecified: Secondary | ICD-10-CM | POA: Insufficient documentation

## 2022-08-26 DIAGNOSIS — Z7984 Long term (current) use of oral hypoglycemic drugs: Secondary | ICD-10-CM | POA: Insufficient documentation

## 2022-08-26 LAB — CBC WITH DIFFERENTIAL/PLATELET
Abs Immature Granulocytes: 0.02 K/uL (ref 0.00–0.07)
Basophils Absolute: 0.1 K/uL (ref 0.0–0.1)
Basophils Relative: 1 %
Eosinophils Absolute: 0.3 K/uL (ref 0.0–0.5)
Eosinophils Relative: 5 %
HCT: 37.4 % — ABNORMAL LOW (ref 39.0–52.0)
Hemoglobin: 12.8 g/dL — ABNORMAL LOW (ref 13.0–17.0)
Immature Granulocytes: 0 %
Lymphocytes Relative: 44 %
Lymphs Abs: 2.9 K/uL (ref 0.7–4.0)
MCH: 31.7 pg (ref 26.0–34.0)
MCHC: 34.2 g/dL (ref 30.0–36.0)
MCV: 92.6 fL (ref 80.0–100.0)
Monocytes Absolute: 0.5 K/uL (ref 0.1–1.0)
Monocytes Relative: 8 %
Neutro Abs: 2.8 K/uL (ref 1.7–7.7)
Neutrophils Relative %: 42 %
Platelets: 247 K/uL (ref 150–400)
RBC: 4.04 MIL/uL — ABNORMAL LOW (ref 4.22–5.81)
RDW: 12.8 % (ref 11.5–15.5)
WBC: 6.6 K/uL (ref 4.0–10.5)
nRBC: 0 % (ref 0.0–0.2)

## 2022-08-26 LAB — BASIC METABOLIC PANEL
Anion gap: 14 (ref 5–15)
BUN: 26 mg/dL — ABNORMAL HIGH (ref 8–23)
CO2: 18 mmol/L — ABNORMAL LOW (ref 22–32)
Calcium: 9.7 mg/dL (ref 8.9–10.3)
Chloride: 106 mmol/L (ref 98–111)
Creatinine, Ser: 1.52 mg/dL — ABNORMAL HIGH (ref 0.61–1.24)
GFR, Estimated: 51 mL/min — ABNORMAL LOW (ref >60)
Glucose, Bld: 177 mg/dL — ABNORMAL HIGH (ref 70–99)
Potassium: 4.1 mmol/L (ref 3.5–5.1)
Sodium: 138 mmol/L (ref 135–145)

## 2022-08-26 LAB — TROPONIN I (HIGH SENSITIVITY)
Troponin I (High Sensitivity): 12 ng/L
Troponin I (High Sensitivity): 14 ng/L

## 2022-08-26 LAB — BRAIN NATRIURETIC PEPTIDE: B Natriuretic Peptide: 32.3 pg/mL (ref 0.0–100.0)

## 2022-08-26 MED ORDER — IOHEXOL 350 MG/ML SOLN
75.0000 mL | Freq: Once | INTRAVENOUS | Status: AC | PRN
  Administered 2022-08-26: 75 mL via INTRAVENOUS

## 2022-08-26 NOTE — Telephone Encounter (Signed)
From pt

## 2022-08-26 NOTE — ED Notes (Signed)
Discharge paperwork given and verbally understood. 

## 2022-08-26 NOTE — Discharge Instructions (Signed)
You are seen today for shortness of breath and chest tightness.  Your work-up today was very reassuring.  Your s troponins were normal, the CT scan does not show any signs of a pulmonary embolism.  Your kidney function was slightly elevated so this should be rechecked by your primary in a few weeks.  Additionally your hemoglobin which is a marker of blood levels was slightly low so have this rechecked at the same time as your kidney function.  Return to the ED if the chest pain changes, you feel really short of breath you have new or concerning symptoms.  Otherwise please follow-up with your cardiologist for reevaluation later this week.

## 2022-08-26 NOTE — ED Provider Notes (Signed)
[facility] EMERGENCY DEPT Provider Note   CSN: [id number] Arrival date & time: 08/26/22  1808     History  Chief Complaint  Patient presents with   Shortness of Breath    Frederick Robinson is a 61 y.o. male.   Shortness of Breath    Patient with medical history of hypertension, hyperlipidemia, diabetes presents today due to shortness of breath x1 week.  Is worse in the middle the night, is actually woken him up from his sleep twice this week.  Is associated with chest tightness which comes and goes and is worse when he lies down flat.  He is not having lower extremity swelling bilaterally for the last few weeks.  Chest tightness is very well localized, it feels like pressure.  It does not radiate elsewhere.  Denies any nausea or vomiting.  No recent travel or surgeries, no history of PE or ACS.  Patient is followed by cardiology, states she had a negative stress test recently.  Elevated calcium mild score at 92%.  Remote history of smoking 25 years ago he quit.  Home Medications Prior to Admission medications   Medication Sig Start Date End Date Taking? Authorizing Provider  amLODipine (NORVASC) 10 MG tablet TAKE 1 TABLET (10 MG TOTAL) BY MOUTH DAILY AT 10 PM. 07/18/22 10/16/22  Tolia, Sunit, DO  aspirin 81 MG tablet Take 81 mg by mouth daily.    [provider]  BD PEN NEEDLE NANO U/F 32G X 4 MM MISC  10/30/14   [provider]  carvedilol (COREG) 12.5 MG tablet Take 1 tablet (12.5 mg total) by mouth 2 (two) times daily with a meal. 04/01/22 06/30/22  Tolia, Sunit, DO  chlorthalidone (HYGROTON) 25 MG tablet Take 1 tablet (25 mg total) by mouth every morning. 04/01/22 06/30/22  Tolia, Sunit, DO  Evolocumab (REPATHA SURECLICK) 195 MG/ML SOAJ Inject 140 mg into the skin every 14 (fourteen) days. 07/02/22   Tolia, Sunit, DO  fenofibrate micronized (LOFIBRA) 200 MG capsule Take 200 mg by mouth daily. 01/04/22   [provider]  ibuprofen (ADVIL,MOTRIN)  400 MG tablet Take 400 mg by mouth every 6 (six) hours as needed for pain.    [provider]  isosorbide-hydrALAZINE (BIDIL) 20-37.5 MG tablet TAKE 1 TABLET BY MOUTH THREE TIMES A DAY 06/17/22   Tolia, Sunit, DO  levothyroxine (SYNTHROID) 175 MCG tablet Take 350 mcg by mouth daily. 11/27/21   [provider]  loratadine (CLARITIN) 10 MG tablet Take 10 mg by mouth daily.    [provider]  losartan (COZAAR) 100 MG tablet Take 1 tablet (100 mg total) by mouth daily at 10 pm. 03/03/22 06/01/22  Tolia, Sunit, DO  metFORMIN (GLUCOPHAGE-XR) 500 MG 24 hr tablet Take 2,000 mg by mouth 2 (two) times daily. 09/06/14   [provider]  [facility] DELICA LANCETS 09T MISC 4 (four) times daily. for testing 12/21/14   [provider]  [facility] VERIO test strip 4 (four) times daily. for testing 12/21/14   [provider]  rosuvastatin (CRESTOR) 40 MG tablet Take 40 mg by mouth daily.    [provider]  Semaglutide, 1 MG/DOSE, (OZEMPIC, 1 MG/DOSE,) 4 MG/3ML SOPN Inject into the skin.    [provider]  TRESIBA FLEXTOUCH 200 UNIT/ML FlexTouch Pen Inject 50 Units into the skin in the morning and at bedtime. 12/02/21   [provider]      Allergies    Pravastatin sodium    Review of Systems  Review of Systems  Respiratory:  Positive for shortness of breath.     Physical Exam Updated Vital Signs BP (!) 154/74 (BP Location: Right Arm)   Pulse 66   Temp 98 F (36.7 C)   Resp 13   Ht '6\' 3"'$  (1.905 m)   Wt 113.4 kg   SpO2 97%   BMI 31.25 kg/m  Physical Exam Vitals and nursing note reviewed. Exam conducted with a chaperone present.  Constitutional:      Appearance: Normal appearance.  HENT:     Head: Normocephalic and atraumatic.  Eyes:     General: No scleral icterus.       Right eye: No discharge.        Left eye: No discharge.     Extraocular Movements: Extraocular movements intact.     Pupils: Pupils are equal, round, and  reactive to light.  Cardiovascular:     Rate and Rhythm: Normal rate and regular rhythm.     Pulses: Normal pulses.     Heart sounds: Normal heart sounds. No murmur heard.    No friction rub. No gallop.     Comments: Regular rhythm, upper and lower extremity pulses 2+ symmetric bilaterally Pulmonary:     Effort: Pulmonary effort is normal. No respiratory distress.     Breath sounds: Normal breath sounds.  Abdominal:     General: Abdomen is flat. Bowel sounds are normal. There is no distension.     Palpations: Abdomen is soft.     Tenderness: There is no abdominal tenderness.  Musculoskeletal:     Right lower leg: Edema present.     Left lower leg: Edema present.     Comments: Trace lower extremity edema  Skin:    General: Skin is warm and dry.     Coloration: Skin is not jaundiced.  Neurological:     Mental Status: He is alert. Mental status is at baseline.     Coordination: Coordination normal.     ED Results / Procedures / Treatments   Labs (all labs ordered are listed, but only abnormal results are displayed) Labs Reviewed  BASIC METABOLIC PANEL - Abnormal; Notable for the following components:      Result Value   CO2 18 (*)    Glucose, Bld 177 (*)    BUN 26 (*)    Creatinine, Ser 1.52 (*)    GFR, Estimated 51 (*)    All other components within normal limits  CBC WITH DIFFERENTIAL/PLATELET - Abnormal; Notable for the following components:   RBC 4.04 (*)    Hemoglobin 12.8 (*)    HCT 37.4 (*)    All other components within normal limits  BRAIN NATRIURETIC PEPTIDE  TROPONIN I (HIGH SENSITIVITY)  TROPONIN I (HIGH SENSITIVITY)    EKG EKG Interpretation  Date/Time:  Tuesday August 26 2022 18:15:28 EDT Ventricular Rate:  89 PR Interval:  202 QRS Duration: 92 QT Interval:  342 QTC Calculation: 416 R Axis:   44 Text Interpretation: Normal sinus rhythm Nonspecific ST abnormality Abnormal ECG When compared with ECG of 21-Feb-2009 10:33, ST no longer elevated in  Lateral leads Nonspecific T wave abnormality now evident in Lateral leads Confirmed by Davonna Belling [phone]) on 08/26/2022 6:43:42 PM  Radiology CT Angio Chest PE W/Cm &/Or Wo Cm  Result Date: 08/26/2022 CLINICAL DATA:  Shortness of breath EXAM: CT ANGIOGRAPHY CHEST WITH CONTRAST TECHNIQUE: Multidetector CT imaging of the chest was performed using the standard protocol during bolus administration of intravenous contrast. Multiplanar  CT image reconstructions and MIPs were obtained to evaluate the vascular anatomy. RADIATION DOSE REDUCTION: This exam was performed according to the departmental dose-optimization program which includes automated exposure control, adjustment of the mA and/or kV according to patient size and/or use of iterative reconstruction technique. CONTRAST:  41m OMNIPAQUE IOHEXOL 350 MG/ML SOLN COMPARISON:  CT dated February 25, 2022 FINDINGS: Cardiovascular: No evidence of pulmonary embolus. Normal heart size. No pericardial effusion. Moderate coronary artery calcifications. Normal caliber thoracic aorta with mild calcified plaque. Mediastinum/Nodes: Thyroid is unremarkable. Mildly enlarged right hilar lymph node measuring 1.0 cm in short axis on series 5, image 113, likely reactive. No pathologically enlarged lymph nodes seen in the chest. Lungs/Pleura: Central airways are patent. No consolidation, pleural effusion or pneumothorax. Upper Abdomen: No acute abnormality. Musculoskeletal: No chest wall abnormality. No acute or significant osseous findings. Review of the MIP images confirms the above findings. IMPRESSION: 1. No evidence of pulmonary embolus or acute airspace opacity. 2. Moderate coronary artery calcifications. 3. Aortic Atherosclerosis (ICD10-I70.0). Electronically Signed   By: LYetta GlassmanM.D.   On: 08/26/2022 20:16    Procedures Procedures    Medications Ordered in ED Medications  iohexol (OMNIPAQUE) 350 MG/ML injection 75 mL (75 mLs Intravenous Contrast Given  08/26/22 1944)    ED Course/ Medical Decision Making/ A&P                           Medical Decision Making Amount and/or Complexity of Data Reviewed Labs: ordered. Radiology: ordered.  Risk Prescription drug management.   Patient presents due to shortness of breath and chest tightness.  Differential includes but not limited to ACS, PE, pneumonia, CHF, dissection, URI.  I reviewed external medical records, patient been seen in urgent care previously.  Also reviewed cardiology notes. -Myocardial perfusion study 06/25/2022 negative for acute process.  LVEF 60%. -Bilateral DVT studies done 06/13/2022 which were negative. -Last echocardiogram 02/03/2022 normal EF, no acute findings.  There is mild concentric hypertrophy of the left ventricle.  I ordered, viewed and interpreted laboratory work-up. CBC without leukocytosis.  Stable anemia with a hemoglobin of 12.8. BMP without gross electrolyte derangement.  Bicarb is slightly decreased at 18, BUN slightly elevated at 36 and creatinine is slightly elevated at 1.52 which is slightly increased compared to his baseline. Troponin 12--->14 BNP is within normal limits, not, not consistent with a new onset of CHF.  EKG shows sinus rhythm.  Patient is on cardiac monitoring in sinus rhythm per my interpretation.  I ordered, viewed and interpreted imaging studies.  CTA PE study is negative for PE or acute process.  Agree with radiologist interpretation.  Given flat troponin and no ischemic findings on EKG do not think this is ACS.  No signs of PE on scan or PNA.  Presentation is not consistent with dissection.  Could be symptomatic anemia but I would expect hemoglobin to be lower to be causing the symptoms.  Patient is not hypoxic, his breathing is unlabored and I feel he is appropriate for close outpatient follow-up with cardiology and return precautions.  Case discussed with attending who agrees with work-up and plan.        Final Clinical  Impression(s) / ED Diagnoses Final diagnoses:  Shortness of breath  Elevated serum creatinine  Anemia, unspecified type    Rx / DC Orders ED Discharge Orders     None         SSherrill Raring [place]10/10/23 2318  Davonna Belling, MD 08/26/22 2351

## 2022-08-26 NOTE — Progress Notes (Signed)
Virtual Visit Consent   Frederick Robinson, you are scheduled for a virtual visit with a Frederick Robinson provider today. Just as with appointments in the office, your consent must be obtained to participate. Your consent will be active for this visit and any virtual visit you may have with one of our providers in the next 365 days. If you have a MyChart account, a copy of this consent can be sent to you electronically.  As this is a virtual visit, video technology does not allow for your provider to perform a traditional examination. This may limit your provider's ability to fully assess your condition. If your provider identifies any concerns that need to be evaluated in person or the need to arrange testing (such as labs, EKG, etc.), we will make arrangements to do so. Although advances in technology are sophisticated, we cannot ensure that it will always work on either your end or our end. If the connection with a video visit is poor, the visit may have to be switched to a telephone visit. With either a video or telephone visit, we are not always able to ensure that we have a secure connection.  By engaging in this virtual visit, you consent to the provision of healthcare and authorize for your insurance to be billed (if applicable) for the services provided during this visit. Depending on your insurance coverage, you may receive a charge related to this service.  I need to obtain your verbal consent now. Are you willing to proceed with your visit today? Frederick Robinson has provided verbal consent on 08/26/2022 for a virtual visit (video or telephone). Apolonio Schneiders, FNP  Date: 08/26/2022 4:28 PM  Virtual Visit via Video Note   I, Apolonio Schneiders, connected with  Frederick Robinson  ([id number], Sep 02, 1961) on 08/26/22 at  4:30 PM EDT by a video-enabled telemedicine application and verified that I am speaking with the correct person using two identifiers.  Location: Patient: Virtual Visit Location Patient:  Home Provider: Virtual Visit Location Provider: Home Office   I discussed the limitations of evaluation and management by telemedicine and the availability of in person appointments. The patient expressed understanding and agreed to proceed.    History of Present Illness: Frederick Robinson is a 61 y.o. who identifies as a male who was assigned male at birth, and is being seen today for follow up after recent UC visit.  5 days ago he woke up in the middle of the night with acute shortness of breath. He did have a pulse ox and watched his oxygen was 90-95 for an extended amount of time  During the next day he was 95-98 no SOB throughout the day   Recurrent drops in oxygen notes while laying flat   He did have some pressure in his chest but no pain during event  Did have URI symptoms 2 weeks prior but none at time of the event.   He went to a [facility] UC and had a normal EKG  Had negative RSV/Flu COVID testing  No bloodwork Normal chest Xray  He has recently completed a workup with a cardiologist including a stress test three weeks ago   He had a troponin test at [facility] that he was able to order himself online Results were abnormally elevated (24) range 0-22 mg/l  Also of note had lower extremity unilateral swelling a few weeks prior   Problems:  Patient Active Problem List   Diagnosis Date Noted   Former smoker 07/12/2013  Hypertension associated with diabetes ([place]) 09/12/2008   Allergic rhinitis 09/23/2007   Diabetes mellitus, type 2 ([place]) 05/13/2007   Hypothyroidism 05/10/2007   Hyperlipidemia associated with type 2 diabetes mellitus ([place]) 05/10/2007    Allergies:  Allergies  Allergen Reactions   Pravastatin Sodium     REACTION: HIVES   Medications:  Current Outpatient Medications:    amLODipine (NORVASC) 10 MG tablet, TAKE 1 TABLET (10 MG TOTAL) BY MOUTH DAILY AT 10 PM., Disp: 90 tablet, Rfl: 0   aspirin 81 MG tablet, Take 81 mg by mouth daily., Disp: , Rfl:    BD PEN  NEEDLE NANO U/F 32G X 4 MM MISC, , Disp: , Rfl:    carvedilol (COREG) 12.5 MG tablet, Take 1 tablet (12.5 mg total) by mouth 2 (two) times daily with a meal., Disp: 180 tablet, Rfl: 0   chlorthalidone (HYGROTON) 25 MG tablet, Take 1 tablet (25 mg total) by mouth every morning., Disp: 30 tablet, Rfl: 2   Evolocumab (REPATHA SURECLICK) 621 MG/ML SOAJ, Inject 140 mg into the skin every 14 (fourteen) days., Disp: 2 mL, Rfl: 3   fenofibrate micronized (LOFIBRA) 200 MG capsule, Take 200 mg by mouth daily., Disp: , Rfl:    ibuprofen (ADVIL,MOTRIN) 400 MG tablet, Take 400 mg by mouth every 6 (six) hours as needed for pain., Disp: , Rfl:    isosorbide-hydrALAZINE (BIDIL) 20-37.5 MG tablet, TAKE 1 TABLET BY MOUTH THREE TIMES A DAY, Disp: 270 tablet, Rfl: 0   levothyroxine (SYNTHROID) 175 MCG tablet, Take 350 mcg by mouth daily., Disp: , Rfl:    loratadine (CLARITIN) 10 MG tablet, Take 10 mg by mouth daily., Disp: , Rfl:    losartan (COZAAR) 100 MG tablet, Take 1 tablet (100 mg total) by mouth daily at 10 pm., Disp: 90 tablet, Rfl: 0   metFORMIN (GLUCOPHAGE-XR) 500 MG 24 hr tablet, Take 2,000 mg by mouth 2 (two) times daily., Disp: , Rfl: 5   ONETOUCH DELICA LANCETS 30Q MISC, 4 (four) times daily. for testing, Disp: , Rfl: 11   ONETOUCH VERIO test strip, 4 (four) times daily. for testing, Disp: , Rfl: 11   rosuvastatin (CRESTOR) 40 MG tablet, Take 40 mg by mouth daily., Disp: , Rfl:    Semaglutide, 1 MG/DOSE, (OZEMPIC, 1 MG/DOSE,) 4 MG/3ML SOPN, Inject into the skin., Disp: , Rfl:    TRESIBA FLEXTOUCH 200 UNIT/ML FlexTouch Pen, Inject 50 Units into the skin in the morning and at bedtime., Disp: , Rfl:   Observations/Objective: Patient is well-developed, well-nourished in no acute distress.  Resting comfortably  at home.  Head is normocephalic, atraumatic.  No labored breathing.  Speech is clear and coherent with logical content.  Patient is alert and oriented at baseline.    Assessment and Plan: 1.  Shortness of breath Recommended ER visit to r/o PE  Patient agreeable to plan will have wife transport him to ED tonight for workup   2. Elevated troponin      Follow Up Instructions: I discussed the assessment and treatment plan with the patient. The patient was provided an opportunity to ask questions and all were answered. The patient agreed with the plan and demonstrated an understanding of the instructions.  A copy of instructions were sent to the patient via MyChart unless otherwise noted below.   The patient was advised to call back or seek an in-person evaluation if the symptoms worsen or if the condition fails to improve as anticipated.  Time:  I spent 15 minutes  with the patient via telehealth technology discussing the above problems/concerns.    Apolonio Schneiders, FNP

## 2022-08-26 NOTE — ED Triage Notes (Signed)
Pt arrives to ED with c/o orthopnea. He reports he gets SOB when he lies down over the past 5 five days. Went to UC on 10/7 and had negative xray. He does report bad cold x3 weeks ago. He ordered a troponin himself over the Internet from [facility] and it came back positive at 24.

## 2022-08-29 ENCOUNTER — Other Ambulatory Visit: Payer: Self-pay | Admitting: Cardiology

## 2022-08-29 DIAGNOSIS — I10 Essential (primary) hypertension: Secondary | ICD-10-CM

## 2022-08-29 NOTE — Telephone Encounter (Signed)
FROM PT

## 2022-09-09 ENCOUNTER — Other Ambulatory Visit: Payer: Self-pay | Admitting: Cardiology

## 2022-09-09 DIAGNOSIS — I10 Essential (primary) hypertension: Secondary | ICD-10-CM

## 2022-09-12 ENCOUNTER — Encounter: Payer: 59 | Admitting: Physician Assistant

## 2022-09-12 NOTE — Progress Notes (Signed)
Appt needed for wife.This encounter was created in error - please disregard.

## 2022-09-13 ENCOUNTER — Other Ambulatory Visit: Payer: Self-pay | Admitting: Cardiology

## 2022-09-13 DIAGNOSIS — I10 Essential (primary) hypertension: Secondary | ICD-10-CM

## 2022-09-29 ENCOUNTER — Ambulatory Visit: Payer: 59 | Admitting: Cardiology

## 2022-10-15 ENCOUNTER — Telehealth: Payer: Self-pay | Admitting: Cardiology

## 2022-10-15 NOTE — Telephone Encounter (Signed)
What labs do you want patient to have prior to his OV with you 12/4?

## 2022-10-15 NOTE — Telephone Encounter (Signed)
Patient says he's supposed to have labs done prior to his appointment. He mentioned a cholesterol test and another one. Can someone look into this and see what tests are needed so I can fax to [facility]?

## 2022-10-16 ENCOUNTER — Other Ambulatory Visit: Payer: Self-pay

## 2022-10-16 ENCOUNTER — Other Ambulatory Visit: Payer: Self-pay | Admitting: Cardiology

## 2022-10-16 ENCOUNTER — Ambulatory Visit: Payer: 59 | Admitting: Cardiology

## 2022-10-16 DIAGNOSIS — I2584 Coronary atherosclerosis due to calcified coronary lesion: Secondary | ICD-10-CM

## 2022-10-16 DIAGNOSIS — E7800 Pure hypercholesterolemia, unspecified: Secondary | ICD-10-CM

## 2022-10-16 DIAGNOSIS — E781 Pure hyperglyceridemia: Secondary | ICD-10-CM

## 2022-10-16 NOTE — Progress Notes (Signed)
cmp

## 2022-10-16 NOTE — Telephone Encounter (Signed)
Please order and release.  Lipid Profile, Direct LDL, Lpa, CMP (FASTING)  Dr. Terri Skains

## 2022-10-17 ENCOUNTER — Other Ambulatory Visit: Payer: Self-pay

## 2022-10-17 LAB — LIPID PANEL
Chol/HDL Ratio: 2.8 ratio (ref 0.0–5.0)
Cholesterol, Total: 136 mg/dL (ref 100–199)
HDL: 48 mg/dL (ref >39)
LDL Chol Calc (NIH): 56 mg/dL (ref 0–99)
Triglycerides: 195 mg/dL — ABNORMAL HIGH (ref 0–149)
VLDL Cholesterol Cal: 32 mg/dL (ref 5–40)

## 2022-10-17 NOTE — Telephone Encounter (Signed)
Did you order these lab for him?

## 2022-10-20 ENCOUNTER — Encounter: Payer: Self-pay | Admitting: Cardiology

## 2022-10-20 ENCOUNTER — Ambulatory Visit: Payer: 59 | Admitting: Cardiology

## 2022-10-20 VITALS — BP 144/72 | HR 50 | Resp 15 | Ht 75.0 in | Wt 256.8 lb

## 2022-10-20 DIAGNOSIS — I2584 Coronary atherosclerosis due to calcified coronary lesion: Secondary | ICD-10-CM

## 2022-10-20 DIAGNOSIS — E7800 Pure hypercholesterolemia, unspecified: Secondary | ICD-10-CM

## 2022-10-20 DIAGNOSIS — Z794 Long term (current) use of insulin: Secondary | ICD-10-CM

## 2022-10-20 DIAGNOSIS — E1165 Type 2 diabetes mellitus with hyperglycemia: Secondary | ICD-10-CM

## 2022-10-20 DIAGNOSIS — E781 Pure hyperglyceridemia: Secondary | ICD-10-CM

## 2022-10-20 DIAGNOSIS — I10 Essential (primary) hypertension: Secondary | ICD-10-CM

## 2022-10-20 NOTE — Progress Notes (Signed)
Date:  10/20/2022   ID:  Frederick Robinson, DOB Mar 24, 1961, MRN [id number]   Chief Complaint  Patient presents with   Follow-up    Coronary artery calcification, hyperlipidemia, hypertriglyceridemia    HPI  Frederick Robinson is a 61 y.o. Caucasian male whose past medical history and cardiovascular risk factors include: Severe coronary artery calcification, former smoker, hypertriglyceridemia, insulin-dependent diabetes mellitus type 2, hypertension, erectile dysfunction, hyperlipidemia.  Referred to the practice at the request of Dr. Chalmers Cater for CAD evaluation given his multiple cardiovascular risk factors.  Workup illustrated this severe coronary artery calcification with a total CAC of 628 placing him at the 92nd percentile.  MPI reported to be low risk study.  Given his hyperlipidemia and CAC patient was started on Repatha and his lipids and TAG have improved.    In the past patient requested assistance with blood pressure management.  His medications have been uptitrated and his home blood pressures have improved.  Patient presents today for 26-monthfollow-up visit.  In October 2023 he was having episodes of shortness of breath and went to the ED for further evaluation.  His high sensitive troponins were negative x 2 and BNP levels were within normal limits.  Otherwise he is doing well from a cardiovascular standpoint.  FUNCTIONAL STATUS: Walks the dog 30 minute a day.    ALLERGIES: Allergies  Allergen Reactions   Pravastatin Sodium     REACTION: HIVES    MEDICATION LIST PRIOR TO VISIT: Current Meds  Medication Sig   amLODipine (NORVASC) 10 MG tablet TAKE 1 TABLET (10 MG TOTAL) BY MOUTH DAILY AT 10 PM.   aspirin 81 MG tablet Take 81 mg by mouth daily.   BD PEN NEEDLE NANO U/F 32G X 4 MM MISC    carvedilol (COREG) 12.5 MG tablet TAKE 1 TABLET (12.5MG TOTAL) BY MOUTH TWICE A DAY WITH MEALS   chlorthalidone (HYGROTON) 25 MG tablet TAKE 1 TABLET BY MOUTH EVERY DAY IN THE MORNING    Evolocumab (REPATHA SURECLICK) 1833MG/ML SOAJ Inject 140 mg into the skin every 14 (fourteen) days.   fenofibrate micronized (LOFIBRA) 200 MG capsule Take 200 mg by mouth daily.   ibuprofen (ADVIL,MOTRIN) 400 MG tablet Take 400 mg by mouth every 6 (six) hours as needed for pain.   isosorbide-hydrALAZINE (BIDIL) 20-37.5 MG tablet TAKE 1 TABLET BY MOUTH THREE TIMES A DAY   levothyroxine (SYNTHROID) 175 MCG tablet Take 350 mcg by mouth daily.   loratadine (CLARITIN) 10 MG tablet Take 10 mg by mouth daily.   losartan (COZAAR) 100 MG tablet Take 1 tablet (100 mg total) by mouth daily at 10 pm.   metFORMIN (GLUCOPHAGE-XR) 500 MG 24 hr tablet Take 2,000 mg by mouth 2 (two) times daily.   ONETOUCH DELICA LANCETS 338VMISC 4 (four) times daily. for testing   [facility] strip 4 (four) times daily. for testing   rosuvastatin (CRESTOR) 40 MG tablet Take 40 mg by mouth daily.   Semaglutide, 1 MG/DOSE, (OZEMPIC, 1 MG/DOSE,) 4 MG/3ML SOPN Inject into the skin.   TRESIBA FLEXTOUCH 200 UNIT/ML FlexTouch Pen Inject 50 Units into the skin in the morning and at bedtime.     PAST MEDICAL HISTORY: Past Medical History:  Diagnosis Date   Diabetes mellitus without complication ([place]    Hyperlipidemia    Hypertension    Thyroid disease     PAST SURGICAL HISTORY: Past Surgical History:  Procedure Laterality Date   COLONOSCOPY N/A 11/07/2014   Procedure:  COLONOSCOPY;  Surgeon: Jamesetta So, MD;  Location: [facility];  Service: Gastroenterology;  Laterality: N/A;  830   KNEE SURGERY Right     FAMILY HISTORY: The patient family history includes Cancer in his father and mother; Cancer (age of onset: 64) in his brother; Diabetes in his father; Heart attack in his father; Heart disease in his father.  SOCIAL HISTORY:  The patient  reports that he quit smoking about 13 years ago. His smoking use included cigars and cigarettes. He has a 50.00 pack-year smoking history. He has never used smokeless  tobacco. He reports current alcohol use of about 10.0 standard drinks of alcohol per week. He reports that he does not use drugs.  REVIEW OF SYSTEMS: Review of Systems  Constitutional: Negative for chills and fever.  HENT:  Negative for hoarse voice and nosebleeds.   Eyes:  Negative for discharge, double vision and pain.  Cardiovascular:  Negative for chest pain, claudication, dyspnea on exertion, leg swelling, near-syncope, orthopnea, palpitations, paroxysmal nocturnal dyspnea and syncope.  Respiratory:  Negative for hemoptysis and shortness of breath.   Musculoskeletal:  Negative for muscle cramps and myalgias.  Gastrointestinal:  Negative for abdominal pain, constipation, diarrhea, hematemesis, hematochezia, melena, nausea and vomiting.  Neurological:  Negative for dizziness, headaches and light-headedness.    PHYSICAL EXAM:    10/20/2022    2:19 PM 08/26/2022    9:30 PM 08/26/2022    9:00 PM  Vitals with BMI  Height _0     Weight 256 lbs 13 oz    BMI 75.9    Systolic 163 846 659  Diastolic 72 74 70  Pulse 50 66 67    CONSTITUTIONAL: Well-developed and well-nourished. No acute distress.  SKIN: Skin is warm and dry. No rash noted. No cyanosis. No pallor. No jaundice.  Arcus senilis. HEAD: Normocephalic and atraumatic.  EYES: No scleral icterus MOUTH/THROAT: Moist oral membranes.  NECK: No JVD present. No thyromegaly noted. No carotid bruits  CHEST Normal respiratory effort. No intercostal retractions  LUNGS: Clear to auscultation bilaterally.  No stridor. No wheezes. No rales.  CARDIOVASCULAR: Regular rate and rhythm, positive S1-S2, no murmurs rubs or gallops appreciated.   ABDOMINAL: Soft, nontender, nondistended, positive bowel sounds in all 4 quadrants, no apparent ascites.  EXTREMITIES: Localized pedal edema bilaterally, warm to touch, 2+ bilateral DP and PT pulses HEMATOLOGIC: No significant bruising NEUROLOGIC: Oriented to person, place, and time. Nonfocal. Normal  muscle tone.  PSYCHIATRIC: Normal mood and affect. Normal behavior. Cooperative  CARDIAC DATABASE: EKG: 01/21/2022: NSR, 64bpm, without underlying injury pain.  10/20/22: Sinus Bradycardia, 53bpm, without underlying injury pattern.   Echocardiogram: 02/03/2022: Left ventricle cavity is normal in size. Mild concentric hypertrophy of the left ventricle. Normal global wall motion. Normal LV systolic function with EF 64%. Normal diastolic filling pattern. Mildly increased LVOT velocity. No significant valvular stenosis. No significant valvular stenosis. Normal right atrial pressure.   Stress Testing: Lexiscan (with Mod Bruce protocol) Nuclear stress test 06/25/2022   Myocardial perfusion is normal. Overall LV systolic function is normal without regional wall motion abnormalities.  Stress LV EF: 60%.  Nondiagnostic ECG stress. The heart rate response was consistent with Regadenoson.  The blood pressure response was physiologic. No previous exam available for comparison. Low risk study.  Heart Catheterization: None  Calcium Scoring.  02/25/2022: Left Main: 33.8 LAD: 239 LCx: 62.2 RCA: 292 Total Agatston Score: 628   MESA database percentile: 92nd Three-vessel coronary atherosclerotic calcifications. The observed calcium score of 628  is at the 92nd percentile for subjects of the same age, gender, and race/ethnicity who are free of clinical cardiovascular disease and treated diabetes.  Renal artery duplex  03/20/2022:  No evidence of renal artery occlusive disease in either renal artery. Mild increase in bilateral resistivity index and suggests medico-renal disease.  Renal length is within normal limits for both kidneys.  Normal abdominal aorta flow velocities noted.  LABORATORY DATA:    Latest Ref Rng & Units 08/26/2022    6:26 PM 02/21/2009   11:37 AM  CBC  WBC 4.0 - 10.5 K/uL 6.6    Hemoglobin 13.0 - 17.0 g/dL 12.8  15.6   Hematocrit 39.0 - 52.0 % 37.4  46.0   Platelets 150 -  400 K/uL 247         Latest Ref Rng & Units 08/26/2022    6:26 PM 03/20/2022    9:29 AM 02/15/2018   11:18 AM  CMP  Glucose 70 - 99 mg/dL 177  115  135   BUN 8 - 23 mg/dL _0 Creatinine 0.61 - 1.24 mg/dL 1.52  0.98  1.27   Sodium 135 - 145 mmol/L 138  142  142   Potassium 3.5 - 5.1 mmol/L 4.1  4.4  5.4   Chloride 98 - 111 mmol/L 106  107  104   CO2 22 - 32 mmol/L _1 Calcium 8.9 - 10.3 mg/dL 9.7  10.4  10.5   Total Protein 6.0 - 8.5 g/dL   7.3   Total Bilirubin 0.0 - 1.2 mg/dL   0.2   Alkaline Phos 39 - 117 IU/L   44   AST 0 - 40 IU/L   29   ALT 0 - 44 IU/L   32     Lipid Panel  Lab Results  Component Value Date   CHOL 136 10/16/2022   HDL 48 10/16/2022   LDLCALC 56 10/16/2022   LDLDIRECT 66.6 10/29/2009   TRIG 195 (H) 10/16/2022   CHOLHDL 2.8 10/16/2022     No components found for: "NTPROBNP" No results for input(s): "PROBNP" in the last 8760 hours. No results for input(s): "TSH" in the last 8760 hours.  BMP Recent Labs    03/20/22 0929 08/26/22 1826  NA 142 138  K 4.4 4.1  CL 107* 106  CO2 21 18*  GLUCOSE 115* 177*  BUN 20 26*  CREATININE 0.98 1.52*  CALCIUM 10.4* 9.7  GFRNONAA  --  51*    HEMOGLOBIN A1C Lab Results  Component Value Date   HGBA1C 6.6 02/15/2018   External Labs:  Date Collected: 07/30/2021 , information obtained by referring physician Potassium: 4.5 Creatinine 1.10 mg/dL. eGFR: 72 mL/min per 1.73 m Lipid profile: Total cholesterol 232 , triglycerides 384 , HDL 48 , LDL 107, non-HDL 184 AST: 40 , ALT: 55 , alkaline phosphatase: 56  Hemoglobin A1c: 8.7 TSH: 8.12    Name Result Date Reference Range  Insulin-Like Growth Factor I (IGF-1 ), Serum   2022-02-06    Insulin-Like Growth Factor I (IGF-1 ), Serum 133.0   60.0-190.0  Cortisol, Free, LC-MS/MS, Serum   2022-02-06    Cortisol, Free, LC-MS/MS, Serum 0.60      Adrenocorticotropic Hormone(ACTH)   2022-02-06    Adrenocorticotropic Hormone 38.9   7.2-63.3   Comprehensive Metabolic Panel (CMP)   4196-22-29    A/G Ratio 1.8   1.1-2.5  Albumin 4.5   3.5-5.3  Alkaline Phosphatase 56  40-129  ALT (SGPT) 46   <5-55  AST (SGOT) 31   <5-46  Bilirubin, Total 0.4   <0.2-1.2  BUN 33   8-23  Calcium 10.1   8.6-10.4  Chloride 106   97-108  CO2 27   22-32  Creatinine 1.18   0.70-1.30  Estimated GFR (Black) 76   >59  Estimated GFR (Other) 66   >59  Glucose 214   65-99  Potassium 4.8   3.5-5.3  Protein 7.0   6.0-8.3  Sodium 143   135-145  Hemoglobin A1C   2022-02-06    Estimated Average Glucose 206      Hemoglobin A1C 8.8   <5.7  Lipid Panel   2022-02-06    Cholesterol 189   <200  Cholesterol / HDL Ratio 6.10   0.00-4.99  HDL Cholesterol 31   >39  LDL Cholesterol (Calculation) SEE COMMENT   <130  LDL/HDL Ratio SEE COMMENT   <3.3  Non-HDL Cholesterol 158   <130  Triglycerides 413   <150  Magnesium   2022-02-06    Magnesium 2.0   1.6-2.4  TSH   2022-02-06    TSH 0.01   0.43-5.25    IMPRESSION:    ICD-10-CM   1. Pure hypercholesterolemia  E78.00     2. Coronary atherosclerosis due to severely calcified coronary lesion  I25.84 EKG 12-Lead    3. Hypertriglyceridemia  E78.1     4. Benign hypertension  I10     5. Type 2 diabetes mellitus with hyperglycemia, with long-term current use of insulin [facility])  E11.65    Z79.4       RECOMMENDATIONS: LAVARIS SEXSON is a 61 y.o. Caucasian male whose past medical history and cardiac risk factors include: Severe coronary artery calcification former smoker, hypertriglyceridemia, insulin-dependent diabetes mellitus type 2, hypertension, erectile dysfunction, hyperlipidemia.  Initially referred to the practice for evaluation of cardiovascular disease given his multiple cardiovascular risk factors.  Patient was noted to have severe coronary artery calcification during workup and therefore was educated on the importance of lipid and triglyceride management.  His LDL levels improved but not at goal  despite being on maximally tolerated dose of statins.  He was started on Repatha which has significantly improved his LDL as well as triglyceride levels.  Patient understands that his triglyceride levels have improved but are not at goal. In the past TAGs were as high as 447m/dL.  He wishes to continue the current medical therapy and will focus on further reduction of foods that are high in triglycerides.  He is scheduled to have fasting lipids in February 2024.  If the triglyceride levels are still not at baseline he will strongly consider the addition of Vascepa at that time.  Results of the echo and stress test reviewed as part of today's office visit.  In the past he is requested assistance with blood pressure management.  Uptitration of antihypertensive medications his home blood pressures are now better controlled.  Medications reconciled.  Renal duplex negative for renal artery stenosis.  In the past he has been encouraged to undergo sleep study evaluation to rule out presence of sleep-related disorders/sleep apnea.  Management of insulin-dependent diabetes mellitus type 2 per endocrinology.  Most recent labs from 10/16/2022 independently reviewed.  CMP and direct LDL are pending.  Spoke to LThe [employer] Corporationregarding reprocessing this if blood sample still available otherwise patient is willing to go back to have it rechecked.   FINAL MEDICATION LIST END OF ENCOUNTER: No orders of the  defined types were placed in this encounter.   There are no discontinued medications.    Current Outpatient Medications:    amLODipine (NORVASC) 10 MG tablet, TAKE 1 TABLET (10 MG TOTAL) BY MOUTH DAILY AT 10 PM., Disp: 90 tablet, Rfl: 0   aspirin 81 MG tablet, Take 81 mg by mouth daily., Disp: , Rfl:    BD PEN NEEDLE NANO U/F 32G X 4 MM MISC, , Disp: , Rfl:    carvedilol (COREG) 12.5 MG tablet, TAKE 1 TABLET (12.5MG TOTAL) BY MOUTH TWICE A DAY WITH MEALS, Disp: 180 tablet, Rfl: 0   chlorthalidone (HYGROTON) 25  MG tablet, TAKE 1 TABLET BY MOUTH EVERY DAY IN THE MORNING, Disp: 30 tablet, Rfl: 2   Evolocumab (REPATHA SURECLICK) 761 MG/ML SOAJ, Inject 140 mg into the skin every 14 (fourteen) days., Disp: 2 mL, Rfl: 3   fenofibrate micronized (LOFIBRA) 200 MG capsule, Take 200 mg by mouth daily., Disp: , Rfl:    ibuprofen (ADVIL,MOTRIN) 400 MG tablet, Take 400 mg by mouth every 6 (six) hours as needed for pain., Disp: , Rfl:    isosorbide-hydrALAZINE (BIDIL) 20-37.5 MG tablet, TAKE 1 TABLET BY MOUTH THREE TIMES A DAY, Disp: 270 tablet, Rfl: 0   levothyroxine (SYNTHROID) 175 MCG tablet, Take 350 mcg by mouth daily., Disp: , Rfl:    loratadine (CLARITIN) 10 MG tablet, Take 10 mg by mouth daily., Disp: , Rfl:    losartan (COZAAR) 100 MG tablet, Take 1 tablet (100 mg total) by mouth daily at 10 pm., Disp: 90 tablet, Rfl: 0   metFORMIN (GLUCOPHAGE-XR) 500 MG 24 hr tablet, Take 2,000 mg by mouth 2 (two) times daily., Disp: , Rfl: 5   ONETOUCH DELICA LANCETS 51I MISC, 4 (four) times daily. for testing, Disp: , Rfl: 11   ONETOUCH VERIO test strip, 4 (four) times daily. for testing, Disp: , Rfl: 11   rosuvastatin (CRESTOR) 40 MG tablet, Take 40 mg by mouth daily., Disp: , Rfl:    Semaglutide, 1 MG/DOSE, (OZEMPIC, 1 MG/DOSE,) 4 MG/3ML SOPN, Inject into the skin., Disp: , Rfl:    TRESIBA FLEXTOUCH 200 UNIT/ML FlexTouch Pen, Inject 50 Units into the skin in the morning and at bedtime., Disp: , Rfl:   Orders Placed This Encounter  Procedures   EKG 12-Lead    There are no Patient Instructions on file for this visit.   --Continue cardiac medications as reconciled in final medication list. --Return in about 6 months (around 04/21/2023) for Follow up, Coronary artery calcification, HLD, TAG. Or sooner if needed. --Continue follow-up with your primary care physician regarding the management of your other chronic comorbid conditions.  Patient's questions and concerns were addressed to his satisfaction. He voices  understanding of the instructions provided during this encounter.   This note was created using a voice recognition software as a result there may be grammatical errors inadvertently enclosed that do not reflect the nature of this encounter. Every attempt is made to correct such errors.  Rex Kras, [place], [facility]  Pager: [phone] Office: [phone]

## 2022-10-21 ENCOUNTER — Other Ambulatory Visit: Payer: Self-pay | Admitting: Cardiology

## 2022-10-21 DIAGNOSIS — I10 Essential (primary) hypertension: Secondary | ICD-10-CM

## 2022-10-22 LAB — SPECIMEN STATUS REPORT

## 2022-10-22 LAB — LDL CHOLESTEROL, DIRECT: LDL Direct: 49 mg/dL (ref 0–99)

## 2022-10-22 LAB — CMP14+EGFR
ALT: 25 IU/L (ref 0–44)
AST: 32 IU/L (ref 0–40)
Albumin/Globulin Ratio: 2.1 (ref 1.2–2.2)
Albumin: 4.8 g/dL (ref 3.9–4.9)
Alkaline Phosphatase: 42 IU/L — ABNORMAL LOW (ref 44–121)
BUN/Creatinine Ratio: 16 (ref 10–24)
BUN: 20 mg/dL (ref 8–27)
Bilirubin Total: <0.2 mg/dL (ref 0.0–1.2)
CO2: 21 mmol/L (ref 20–29)
Calcium: 10 mg/dL (ref 8.6–10.2)
Chloride: 109 mmol/L — ABNORMAL HIGH (ref 96–106)
Creatinine, Ser: 1.29 mg/dL — ABNORMAL HIGH (ref 0.76–1.27)
Globulin, Total: 2.3 g/dL (ref 1.5–4.5)
Glucose: 62 mg/dL — ABNORMAL LOW (ref 70–99)
Potassium: 4.6 mmol/L (ref 3.5–5.2)
Sodium: 146 mmol/L — ABNORMAL HIGH (ref 134–144)
Total Protein: 7.1 g/dL (ref 6.0–8.5)
eGFR: 63 mL/min/{1.73_m2} (ref >59)

## 2022-10-22 NOTE — Progress Notes (Signed)
Called patient, there was no mail box to leave a VM.

## 2022-10-22 NOTE — Telephone Encounter (Signed)
Called to give patient lab results. No answer. VM not set up.

## 2022-10-22 NOTE — Telephone Encounter (Signed)
-----   Message from [place], [place] sent at 10/22/2022 10:10 AM EST ----- These are the pending labs that [facility] processed.  Your liver function test are not elevated.  LDL is well controlled.  Continue meds as discussed at last visit.   Dr.Tolia

## 2022-10-22 NOTE — Progress Notes (Signed)
They are scanned into chart under labs

## 2022-10-24 NOTE — Progress Notes (Signed)
3rd attempt: NA, VM box is full and cannot receive messages.

## 2022-10-28 NOTE — Progress Notes (Signed)
Spoke with patient about labs. Patient acknowledged understanding.

## 2022-10-28 NOTE — Progress Notes (Signed)
4th attempt. Mail box is full

## 2022-10-30 ENCOUNTER — Other Ambulatory Visit: Payer: Self-pay | Admitting: Cardiology

## 2022-10-30 DIAGNOSIS — I10 Essential (primary) hypertension: Secondary | ICD-10-CM

## 2022-12-07 ENCOUNTER — Other Ambulatory Visit: Payer: Self-pay | Admitting: Cardiology

## 2022-12-07 DIAGNOSIS — I10 Essential (primary) hypertension: Secondary | ICD-10-CM

## 2022-12-10 ENCOUNTER — Other Ambulatory Visit: Payer: Self-pay | Admitting: Cardiology

## 2022-12-10 DIAGNOSIS — I10 Essential (primary) hypertension: Secondary | ICD-10-CM

## 2023-01-30 DIAGNOSIS — Z85820 Personal history of malignant melanoma of skin: Secondary | ICD-10-CM | POA: Insufficient documentation

## 2023-03-16 ENCOUNTER — Other Ambulatory Visit: Payer: Self-pay | Admitting: Cardiology

## 2023-03-16 DIAGNOSIS — I10 Essential (primary) hypertension: Secondary | ICD-10-CM

## 2023-04-07 ENCOUNTER — Ambulatory Visit: Payer: 59 | Admitting: Cardiology

## 2023-04-07 ENCOUNTER — Encounter: Payer: Self-pay | Admitting: Cardiology

## 2023-04-07 VITALS — BP 146/69 | HR 67 | Resp 17 | Ht 75.0 in | Wt 243.4 lb

## 2023-04-07 DIAGNOSIS — I2584 Coronary atherosclerosis due to calcified coronary lesion: Secondary | ICD-10-CM

## 2023-04-07 DIAGNOSIS — I10 Essential (primary) hypertension: Secondary | ICD-10-CM

## 2023-04-07 DIAGNOSIS — E781 Pure hyperglyceridemia: Secondary | ICD-10-CM

## 2023-04-07 DIAGNOSIS — E1165 Type 2 diabetes mellitus with hyperglycemia: Secondary | ICD-10-CM

## 2023-04-07 DIAGNOSIS — Z794 Long term (current) use of insulin: Secondary | ICD-10-CM

## 2023-04-07 DIAGNOSIS — E7800 Pure hypercholesterolemia, unspecified: Secondary | ICD-10-CM

## 2023-04-07 NOTE — Progress Notes (Signed)
ID:  Frederick Robinson, DOB Sep 28, 1961, MRN [id number]  Date: 04/07/23 Last Office Visit: 10/20/2022  Chief Complaint  Patient presents with   CAC   Hyperlipidemia   Follow-up    HPI  Frederick Robinson is a 62 y.o. Caucasian male whose past medical history and cardiovascular risk factors include: Severe coronary artery calcification, former smoker, hypertriglyceridemia, insulin-dependent diabetes mellitus type 2, hypertension, erectile dysfunction, hyperlipidemia.  Patient was referred to the practice by Dr. Talmage Nap for evaluation of CAD due to multiple cardiovascular risk factors.  Cardiovascular workup notable for severe CAC (total CAC 628, 92nd percentile), MPI reported as low risk.  Since then we have been working on improving his modifiable cardiovascular risk factors.  Given his elevated lipids and triglyceride levels and severe CAC patient was placed on Repatha and fenofibrate.  Labs in November 2023 noted improvement in his lipids with the exception of triglycerides.  At that time the shared decision was to implement lifestyle changes and have her reevaluated in February 2024.  Unfortunately, since last office visit he has had COVID as well as gastroenteritis which has affected his ability to take medications regularly.  Most recent labs from February 2024 independently reviewed via Care Everywhere his lipids have up trended and still has a triglycerides.  However, patient ensures that he has been on prescribed medications since March 2024 consistently.  He denies anginal chest pain or heart failure symptoms.  Patient states that he had a mole on his back that was recently biopsied which came back as melanoma.  He is currently in the workup with dermatology and surgical team.  FUNCTIONAL STATUS: Walks the dog 30 minute a day.    ALLERGIES: Allergies  Allergen Reactions   Pravastatin Sodium     REACTION: HIVES    MEDICATION LIST PRIOR TO VISIT: Current Meds  Medication Sig    amLODipine (NORVASC) 10 MG tablet TAKE 1 TABLET (10 MG TOTAL) BY MOUTH DAILY AT 10 PM.   aspirin 81 MG tablet Take 81 mg by mouth daily.   BD PEN NEEDLE NANO U/F 32G X 4 MM MISC    carvedilol (COREG) 12.5 MG tablet TAKE 1 TABLET (12.5MG  TOTAL) BY MOUTH TWICE A DAY WITH MEALS   chlorthalidone (HYGROTON) 25 MG tablet TAKE 1 TABLET BY MOUTH EVERY DAY IN THE MORNING   Evolocumab (REPATHA SURECLICK) 140 MG/ML SOAJ INJECT 140 MG INTO THE SKIN EVERY 14 (FOURTEEN) DAYS.   fenofibrate micronized (LOFIBRA) 200 MG capsule Take 200 mg by mouth daily.   ibuprofen (ADVIL,MOTRIN) 400 MG tablet Take 400 mg by mouth every 6 (six) hours as needed for pain.   isosorbide-hydrALAZINE (BIDIL) 20-37.5 MG tablet TAKE 1 TABLET BY MOUTH THREE TIMES A DAY   levothyroxine (SYNTHROID) 175 MCG tablet Take 350 mcg by mouth daily.   loratadine (CLARITIN) 10 MG tablet Take 10 mg by mouth daily.   losartan (COZAAR) 100 MG tablet Take 1 tablet (100 mg total) by mouth daily at 10 pm.   metFORMIN (GLUCOPHAGE-XR) 500 MG 24 hr tablet Take 2,000 mg by mouth 2 (two) times daily.   ONETOUCH DELICA LANCETS 33G MISC 4 (four) times daily. for testing   [facility] VERIO test strip 4 (four) times daily. for testing   rosuvastatin (CRESTOR) 40 MG tablet Take 40 mg by mouth daily.   Semaglutide, 1 MG/DOSE, (OZEMPIC, 1 MG/DOSE,) 4 MG/3ML SOPN Inject into the skin.   TRESIBA FLEXTOUCH 200 UNIT/ML FlexTouch Pen Inject 50 Units into the skin in the morning  and at bedtime.     PAST MEDICAL HISTORY: Past Medical History:  Diagnosis Date   Diabetes mellitus without complication (HCC)    Hyperlipidemia    Hypertension    Thyroid disease     PAST SURGICAL HISTORY: Past Surgical History:  Procedure Laterality Date   COLONOSCOPY N/A 11/07/2014   Procedure: COLONOSCOPY;  Surgeon: Dalia Heading, MD;  Location: [facility];  Service: Gastroenterology;  Laterality: N/A;  830   KNEE SURGERY Right     FAMILY HISTORY: The patient family  history includes Cancer in his father and mother; Cancer (age of onset: 44) in his brother; Diabetes in his father; Heart attack in his father; Heart disease in his father.  SOCIAL HISTORY:  The patient  reports that he quit smoking about 13 years ago. His smoking use included cigars and cigarettes. He has a 50.00 pack-year smoking history. He has never used smokeless tobacco. He reports current alcohol use of about 10.0 standard drinks of alcohol per week. He reports that he does not use drugs.  REVIEW OF SYSTEMS: Review of Systems  Constitutional: Negative for chills and fever.  HENT:  Negative for hoarse voice and nosebleeds.   Eyes:  Negative for discharge, double vision and pain.  Cardiovascular:  Negative for chest pain, claudication, dyspnea on exertion, leg swelling, near-syncope, orthopnea, palpitations, paroxysmal nocturnal dyspnea and syncope.  Respiratory:  Negative for hemoptysis and shortness of breath.   Musculoskeletal:  Negative for muscle cramps and myalgias.  Gastrointestinal:  Negative for abdominal pain, constipation, diarrhea, hematemesis, hematochezia, melena, nausea and vomiting.  Neurological:  Negative for dizziness, headaches and light-headedness.    PHYSICAL EXAM:    04/07/2023    9:59 AM 10/20/2022    2:19 PM 08/26/2022    9:30 PM  Vitals with BMI  Height 6\' 3"  6\' 3"    Weight 243 lbs 6 oz 256 lbs 13 oz   BMI 30.42 32.1   Systolic 146 144 161  Diastolic 69 72 74  Pulse 67 50 66   Physical Exam  Constitutional: No distress.  Age appropriate, hemodynamically stable.   Neck: No JVD present.  Cardiovascular: Normal rate, regular rhythm, S1 normal, S2 normal, intact distal pulses and normal pulses. Exam reveals no gallop, no S3 and no S4.  No murmur heard. Pulses:      Dorsalis pedis pulses are 2+ on the right side and 2+ on the left side.       Posterior tibial pulses are 2+ on the right side and 2+ on the left side.  Pulmonary/Chest: Effort normal and  breath sounds normal. No stridor. He has no wheezes. He has no rales.  Abdominal: Soft. Bowel sounds are normal. He exhibits no distension. There is no abdominal tenderness.  Musculoskeletal:        General: No edema.     Cervical back: Neck supple.  Neurological: He is alert and oriented to person, place, and time. He has intact cranial nerves (2-12).  Skin: Skin is warm and moist.   CARDIAC DATABASE: EKG: 01/21/2022: NSR, 64bpm, without underlying injury pain.  10/20/22: Sinus Bradycardia, 53bpm, without underlying injury pattern.   Echocardiogram: 02/03/2022: Left ventricle cavity is normal in size. Mild concentric hypertrophy of the left ventricle. Normal global wall motion. Normal LV systolic function with EF 64%. Normal diastolic filling pattern. Mildly increased LVOT velocity. No significant valvular stenosis. No significant valvular stenosis. Normal right atrial pressure.   Stress Testing: Lexiscan (with Mod Bruce protocol) Nuclear stress test  06/25/2022   Myocardial perfusion is normal. Overall LV systolic function is normal without regional wall motion abnormalities.  Stress LV EF: 60%.  Nondiagnostic ECG stress. The heart rate response was consistent with Regadenoson.  The blood pressure response was physiologic. No previous exam available for comparison. Low risk study.  Heart Catheterization: None  Calcium Scoring.  02/25/2022: Left Main: 33.8 LAD: 239 LCx: 62.2 RCA: 292 Total Agatston Score: 628   MESA database percentile: 92nd Three-vessel coronary atherosclerotic calcifications. The observed calcium score of 628 is at the 92nd percentile for subjects of the same age, gender, and race/ethnicity who are free of clinical cardiovascular disease and treated diabetes.  Renal artery duplex  03/20/2022:  No evidence of renal artery occlusive disease in either renal artery. Mild increase in bilateral resistivity index and suggests medico-renal disease.  Renal length is  within normal limits for both kidneys.  Normal abdominal aorta flow velocities noted.  LABORATORY DATA:    Latest Ref Rng & Units 08/26/2022    6:26 PM 02/21/2009   11:37 AM  CBC  WBC 4.0 - 10.5 K/uL 6.6    Hemoglobin 13.0 - 17.0 g/dL 16.1  09.6   Hematocrit 39.0 - 52.0 % 37.4  46.0   Platelets 150 - 400 K/uL 247         Latest Ref Rng & Units 10/16/2022   11:24 AM 08/26/2022    6:26 PM 03/20/2022    9:29 AM  CMP  Glucose 70 - 99 mg/dL 62  045  409   BUN 8 - 27 mg/dL 20  26  20    Creatinine 0.76 - 1.27 mg/dL 8.11  9.14  7.82   Sodium 134 - 144 mmol/L 146  138  142   Potassium 3.5 - 5.2 mmol/L 4.6  4.1  4.4   Chloride 96 - 106 mmol/L 109  106  107   CO2 20 - 29 mmol/L 21  18  21    Calcium 8.6 - 10.2 mg/dL 95.6  9.7  21.3   Total Protein 6.0 - 8.5 g/dL 7.1     Total Bilirubin 0.0 - 1.2 mg/dL <0.8     Alkaline Phos 44 - 121 IU/L 42     AST 0 - 40 IU/L 32     ALT 0 - 44 IU/L 25       Lipid Panel  Lab Results  Component Value Date   CHOL 136 10/16/2022   HDL 48 10/16/2022   LDLCALC 56 10/16/2022   LDLDIRECT 49 10/16/2022   TRIG 195 (H) 10/16/2022   CHOLHDL 2.8 10/16/2022     No components found for: "NTPROBNP" No results for input(s): "PROBNP" in the last 8760 hours. No results for input(s): "TSH" in the last 8760 hours.  BMP Recent Labs    08/26/22 1826 10/16/22 1124  NA 138 146*  K 4.1 4.6  CL 106 109*  CO2 18* 21  GLUCOSE 177* 62*  BUN 26* 20  CREATININE 1.52* 1.29*  CALCIUM 9.7 10.0  GFRNONAA 51*  --     HEMOGLOBIN A1C Lab Results  Component Value Date   HGBA1C 6.6 02/15/2018   External Labs:  Date Collected: 07/30/2021 , information obtained by referring physician Potassium: 4.5 Creatinine 1.10 mg/dL. eGFR: 72 mL/min per 1.73 m Lipid profile: Total cholesterol 232 , triglycerides 384 , HDL 48 , LDL 107, non-HDL 657 AST: 40 , ALT: 55 , alkaline phosphatase: 56  Hemoglobin A1c: 8.7 TSH: 8.12    Name Result Date Reference  Range   Insulin-Like Growth Factor I (IGF-1 ), Serum   2022-02-06    Insulin-Like Growth Factor I (IGF-1 ), Serum 133.0   60.0-190.0  Cortisol, Free, LC-MS/MS, Serum   2022-02-06    Cortisol, Free, LC-MS/MS, Serum 0.60      Adrenocorticotropic Hormone(ACTH)   2022-02-06    Adrenocorticotropic Hormone 38.9   7.2-63.3  Comprehensive Metabolic Panel (CMP)   2022-02-06    A/G Ratio 1.8   1.1-2.5  Albumin 4.5   3.5-5.3  Alkaline Phosphatase 56   40-129  ALT (SGPT) 46   <5-55  AST (SGOT) 31   <5-46  Bilirubin, Total 0.4   <0.2-1.2  BUN 33   8-23  Calcium 10.1   8.6-10.4  Chloride 106   97-108  CO2 27   22-32  Creatinine 1.18   0.70-1.30  Estimated GFR (Black) 76   >59  Estimated GFR (Other) 66   >59  Glucose 214   65-99  Potassium 4.8   3.5-5.3  Protein 7.0   6.0-8.3  Sodium 143   135-145  Hemoglobin A1C   2022-02-06    Estimated Average Glucose 206      Hemoglobin A1C 8.8   <5.7  Lipid Panel   2022-02-06    Cholesterol 189   <200  Cholesterol / HDL Ratio 6.10   0.00-4.99  HDL Cholesterol 31   >39  LDL Cholesterol (Calculation) SEE COMMENT   <130  LDL/HDL Ratio SEE COMMENT   <3.3  Non-HDL Cholesterol 158   <130  Triglycerides 413   <150  Magnesium   2022-02-06    Magnesium 2.0   1.6-2.4  TSH   2022-02-06    TSH 0.01   0.43-5.25    External Labs:  12/31/2022 Comprehensive Metabolic Panel (CMP) Reviewed UJWJ:19/14/7829 07:55:41 AM Interpretation: Performing FAO:ZHYQMVH [place], [address] [place], [place] [id number], Phone - [phone], Director - MDNagendra Notes/Report:  Glucose 131 70-99 mg/dL    BUN 18 2-72 mg/dL    Creatinine 5.36 6.44-0.34 mg/dL    eGFR 79 >74 QV/ZDG/3.87    BUN/Creatinine Ratio 17 10-24    Sodium 141 134-144 mmol/L    Potassium 4.3 3.5-5.2 mmol/L    Chloride 102 96-106 mmol/L    Carbon Dioxide, Total 22 20-29 mmol/L    Calcium 10.3 8.6-10.2 mg/dL    Protein, Total 7.4 5.6-4.3 g/dL    Albumin 4.9 3.2-9.5 g/dL    Globulin, Total 2.5 1.5-4.5  g/dL    A/G Ratio 2.0 1.8-8.4    Bilirubin, Total 0.3 0.0-1.2 mg/dL    Alkaline Phosphatase 53 44-121 IU/L    AST (SGOT) 20 0-40 IU/L    ALT (SGPT) 21 0-44 IU/L    Hemoglobin A1C Reviewed date:01/01/2023 07:55:41 AM Interpretation: Performing ZYS:AYTKZSW [place], [address], [place], [place] [id number], Phone - [phone], Director - MDNagendra Notes/Report:  Hemoglobin A1c 7.0 4.8-5.6 % .  Prediabetes: 5.7 - 6.4  Diabetes: >6.4  Glycemic control for adults with diabetes: <7.0   Lipid Panel Reviewed date:01/01/2023 07:55:42 AM Interpretation: Performing WCB:JSEGBTD [place], [address], [place], [place] [id number], Phone - [phone], Director - MDNagendra Notes/Report:  Cholesterol, Total 190 100-199 mg/dL    Triglycerides 627 0-350 mg/dL    HDL Cholesterol 45 >09 mg/dL    VLDL Cholesterol Cal 52 5-40 mg/dL    LDL Chol Calc (NIH) 93 0-99 mg/dL    Comment:        TSH Reviewed date:01/01/2023 07:55:42 AM Interpretation: Performing [facility]:WEXHBZJ [place], [address] [place], [place] [id number], Phone -  [phone], Director - MDNagendra Notes/Report:  TSH 0.839 0.450-4.500 uIU/mL      IMPRESSION:    ICD-10-CM   1. Coronary atherosclerosis due to severely calcified coronary lesion  I25.84 Lipid Panel With LDL/HDL Ratio    LDL cholesterol, direct    CMP14+EGFR    2. Pure hypercholesterolemia  E78.00     3. Hypertriglyceridemia  E78.1     4. Benign hypertension  I10     5. Type 2 diabetes mellitus with hyperglycemia, with long-term current use of insulin [facility])  E11.65    Z79.4       RECOMMENDATIONS: CLAYBON KOCIK is a 62 y.o. Caucasian male whose past medical history and cardiac risk factors include: Severe coronary artery calcification former smoker, hypertriglyceridemia, insulin-dependent diabetes mellitus type 2, hypertension, erectile dysfunction, hyperlipidemia.  Coronary atherosclerosis due to severely calcified coronary lesion Total CAC 628, 92nd  percentile Continue aspirin, statin therapy, Repatha and fenofibrate Has undergone ischemic workup as outlined above. No anginal discomfort since last office visit. No additional cardiovascular testing warranted at this time. Reemphasized importance of improving his modifiable cardiovascular risk factors which includes but not limited to glycemic control, lipids and triglyceride management. Patient has lost 13 pounds since last office visit and is currently on Ozempic as well. Sleep study still pending.  Pure hypercholesterolemia Hypertriglyceridemia Currently on maximally tolerated dose of statin, Repatha, fenofibrate. Most recent lipids from February 2024 independently reviewed from care everywhere. His lipid profile has up trended compared to November 2023. This could be secondary to not taking medications religiously given his recent gastroenteritis and COVID infections. Recommended continuing medical therapy for the next 6 weeks and to reevaluate lipids. If triglycerides continue to be elevated patient is willing to be on Vascepa. In the interim I have asked him to reach out to Dr. Talmage Nap to see if uptitration of Ozempic can be done.   Benign hypertension Office blood pressure not at goal. Home blood pressures are very well-controlled, per patient SBP ranges between 116-121 mmHg. Continue current medical therapy. Reemphasized importance of a low-salt diet.  Type 2 diabetes mellitus with hyperglycemia, with long-term current use of insulin (HCC) Most recent hemoglobin A1c 7.0. Currently follows Dr. Talmage Nap.  Currently on aspirin, statin therapy, Repatha, fenofibrate, ARB, and Ozempic   FINAL MEDICATION LIST END OF ENCOUNTER: No orders of the defined types were placed in this encounter.   There are no discontinued medications.    Current Outpatient Medications:    amLODipine (NORVASC) 10 MG tablet, TAKE 1 TABLET (10 MG TOTAL) BY MOUTH DAILY AT 10 PM., Disp: 90 tablet, Rfl: 0    aspirin 81 MG tablet, Take 81 mg by mouth daily., Disp: , Rfl:    BD PEN NEEDLE NANO U/F 32G X 4 MM MISC, , Disp: , Rfl:    carvedilol (COREG) 12.5 MG tablet, TAKE 1 TABLET (12.5MG  TOTAL) BY MOUTH TWICE A DAY WITH MEALS, Disp: 180 tablet, Rfl: 1   chlorthalidone (HYGROTON) 25 MG tablet, TAKE 1 TABLET BY MOUTH EVERY DAY IN THE MORNING, Disp: 30 tablet, Rfl: 2   Evolocumab (REPATHA SURECLICK) 140 MG/ML SOAJ, INJECT 140 MG INTO THE SKIN EVERY 14 (FOURTEEN) DAYS., Disp: 2 mL, Rfl: 3   fenofibrate micronized (LOFIBRA) 200 MG capsule, Take 200 mg by mouth daily., Disp: , Rfl:    ibuprofen (ADVIL,MOTRIN) 400 MG tablet, Take 400 mg by mouth every 6 (six) hours as needed for pain., Disp: , Rfl:    isosorbide-hydrALAZINE (BIDIL) 20-37.5 MG tablet, TAKE 1 TABLET  BY MOUTH THREE TIMES A DAY, Disp: 270 tablet, Rfl: 1   levothyroxine (SYNTHROID) 175 MCG tablet, Take 350 mcg by mouth daily., Disp: , Rfl:    loratadine (CLARITIN) 10 MG tablet, Take 10 mg by mouth daily., Disp: , Rfl:    losartan (COZAAR) 100 MG tablet, Take 1 tablet (100 mg total) by mouth daily at 10 pm., Disp: 90 tablet, Rfl: 0   metFORMIN (GLUCOPHAGE-XR) 500 MG 24 hr tablet, Take 2,000 mg by mouth 2 (two) times daily., Disp: , Rfl: 5   ONETOUCH DELICA LANCETS 33G MISC, 4 (four) times daily. for testing, Disp: , Rfl: 11   ONETOUCH VERIO test strip, 4 (four) times daily. for testing, Disp: , Rfl: 11   rosuvastatin (CRESTOR) 40 MG tablet, Take 40 mg by mouth daily., Disp: , Rfl:    Semaglutide, 1 MG/DOSE, (OZEMPIC, 1 MG/DOSE,) 4 MG/3ML SOPN, Inject into the skin., Disp: , Rfl:    TRESIBA FLEXTOUCH 200 UNIT/ML FlexTouch Pen, Inject 50 Units into the skin in the morning and at bedtime., Disp: , Rfl:   Orders Placed This Encounter  Procedures   Lipid Panel With LDL/HDL Ratio   LDL cholesterol, direct   [id number]    There are no Patient Instructions on file for this visit.   --Continue cardiac medications as reconciled in final medication  list. --Return in about 6 months (around 10/08/2023) for Follow up, Coronary artery calcification, Lipid. Or sooner if needed. --Continue follow-up with your primary care physician regarding the management of your other chronic comorbid conditions.  Patient's questions and concerns were addressed to his satisfaction. He voices understanding of the instructions provided during this encounter.   This note was created using a voice recognition software as a result there may be grammatical errors inadvertently enclosed that do not reflect the nature of this encounter. Every attempt is made to correct such errors.  Tessa Lerner, [place], [facility]  Pager:  [phone] Office: [phone]

## 2023-04-20 ENCOUNTER — Ambulatory Visit: Payer: 59 | Admitting: Cardiology

## 2023-04-23 ENCOUNTER — Other Ambulatory Visit: Payer: Self-pay | Admitting: Cardiology

## 2023-04-23 DIAGNOSIS — I10 Essential (primary) hypertension: Secondary | ICD-10-CM

## 2023-04-30 DIAGNOSIS — S86312S Strain of muscle(s) and tendon(s) of peroneal muscle group at lower leg level, left leg, sequela: Secondary | ICD-10-CM | POA: Insufficient documentation

## 2023-04-30 DIAGNOSIS — M216X2 Other acquired deformities of left foot: Secondary | ICD-10-CM | POA: Insufficient documentation

## 2023-04-30 DIAGNOSIS — M25572 Pain in left ankle and joints of left foot: Secondary | ICD-10-CM | POA: Insufficient documentation

## 2023-05-03 DIAGNOSIS — S83249A Other tear of medial meniscus, current injury, unspecified knee, initial encounter: Secondary | ICD-10-CM | POA: Insufficient documentation

## 2023-05-08 ENCOUNTER — Encounter (HOSPITAL_BASED_OUTPATIENT_CLINIC_OR_DEPARTMENT_OTHER): Payer: Self-pay | Admitting: Emergency Medicine

## 2023-05-08 ENCOUNTER — Other Ambulatory Visit (HOSPITAL_BASED_OUTPATIENT_CLINIC_OR_DEPARTMENT_OTHER): Payer: Self-pay

## 2023-05-08 ENCOUNTER — Other Ambulatory Visit: Payer: Self-pay

## 2023-05-08 ENCOUNTER — Emergency Department (HOSPITAL_BASED_OUTPATIENT_CLINIC_OR_DEPARTMENT_OTHER)
Admission: EM | Admit: 2023-05-08 | Discharge: 2023-05-08 | Disposition: A | Discharge status: Home / Self Care | Payer: 59 | Attending: Emergency Medicine | Admitting: Emergency Medicine

## 2023-05-08 DIAGNOSIS — R42 Dizziness and giddiness: Secondary | ICD-10-CM | POA: Diagnosis present

## 2023-05-08 DIAGNOSIS — N289 Disorder of kidney and ureter, unspecified: Secondary | ICD-10-CM | POA: Insufficient documentation

## 2023-05-08 DIAGNOSIS — R519 Headache, unspecified: Secondary | ICD-10-CM | POA: Diagnosis not present

## 2023-05-08 DIAGNOSIS — E860 Dehydration: Secondary | ICD-10-CM | POA: Insufficient documentation

## 2023-05-08 DIAGNOSIS — I951 Orthostatic hypotension: Secondary | ICD-10-CM

## 2023-05-08 LAB — BASIC METABOLIC PANEL
Anion gap: 9 (ref 5–15)
BUN: 30 mg/dL — ABNORMAL HIGH (ref 8–23)
CO2: 23 mmol/L (ref 22–32)
Calcium: 9.8 mg/dL (ref 8.9–10.3)
Chloride: 108 mmol/L (ref 98–111)
Creatinine, Ser: 2.02 mg/dL — ABNORMAL HIGH (ref 0.61–1.24)
GFR, Estimated: 36 mL/min — ABNORMAL LOW (ref >60)
Glucose, Bld: 175 mg/dL — ABNORMAL HIGH (ref 70–99)
Potassium: 4.5 mmol/L (ref 3.5–5.1)
Sodium: 140 mmol/L (ref 135–145)

## 2023-05-08 LAB — CBC WITH DIFFERENTIAL/PLATELET
Abs Immature Granulocytes: 0.03 10*3/uL (ref 0.00–0.07)
Basophils Absolute: 0.1 10*3/uL (ref 0.0–0.1)
Basophils Relative: 1 %
Eosinophils Absolute: 0.3 10*3/uL (ref 0.0–0.5)
Eosinophils Relative: 6 %
HCT: 35.7 % — ABNORMAL LOW (ref 39.0–52.0)
Hemoglobin: 12.1 g/dL — ABNORMAL LOW (ref 13.0–17.0)
Immature Granulocytes: 1 %
Lymphocytes Relative: 34 %
Lymphs Abs: 1.6 10*3/uL (ref 0.7–4.0)
MCH: 31.1 pg (ref 26.0–34.0)
MCHC: 33.9 g/dL (ref 30.0–36.0)
MCV: 91.8 fL (ref 80.0–100.0)
Monocytes Absolute: 0.4 10*3/uL (ref 0.1–1.0)
Monocytes Relative: 8 %
Neutro Abs: 2.4 10*3/uL (ref 1.7–7.7)
Neutrophils Relative %: 50 %
Platelets: 218 10*3/uL (ref 150–400)
RBC: 3.89 MIL/uL — ABNORMAL LOW (ref 4.22–5.81)
RDW: 13.1 % (ref 11.5–15.5)
WBC: 4.7 10*3/uL (ref 4.0–10.5)
nRBC: 0 % (ref 0.0–0.2)

## 2023-05-08 LAB — SEDIMENTATION RATE: Sed Rate: 8 mm/hr (ref 0–16)

## 2023-05-08 MED ORDER — SODIUM CHLORIDE 0.9 % IV BOLUS
500.0000 mL | Freq: Once | INTRAVENOUS | Status: AC
  Administered 2023-05-08: 500 mL via INTRAVENOUS

## 2023-05-08 NOTE — ED Notes (Signed)
Discharge paperwork given and verbally understood. 

## 2023-05-08 NOTE — ED Triage Notes (Signed)
Pt arrives to ED with c/o dizziness, neck stiffness, joint pain, light sensitivity, headaches x2 weeks after tick bite. Pt notes that he had an episode of hypotension today.

## 2023-05-08 NOTE — Discharge Instructions (Addendum)
Hold carvedilol until you see your doctor. Have your kidney function rechecked early next week. Stay well hydrated. Your inflammatory marker test was negative you do not need to see ENT doctor. Follow-up with primary doctor or cardiologist next week.

## 2023-05-08 NOTE — ED Provider Notes (Signed)
[place] EMERGENCY DEPARTMENT AT [facility] Provider Note   CSN: [id number] Arrival date & time: 05/08/23  1242     History  Chief Complaint  Patient presents with   Dizziness   Torticollis   Hypotension    Frederick Robinson is a 62 y.o. male.  Patient presents with intermittent lightheadedness, intermittent neck pain and joint pain, light sensitivity.  Patient was treated for tick bite exposure and joint pains and completed 2 weeks of doxycycline recently.  Today patient felt lightheaded.  Patient has no neck stiffness, no fevers, no rash, no chest pain or shortness of breath.  Patient is on 4 different blood pressure medications.  No blood in the stools.  Heart rate is normally normal.  Headache and temple region, no jaw claudication or pain.         Home Medications Prior to Admission medications   Medication Sig Start Date End Date Taking? Authorizing Provider  amLODipine (NORVASC) 10 MG tablet TAKE 1 TABLET (10 MG TOTAL) BY MOUTH DAILY AT 10 PM. 04/23/23 07/22/23  Yates Decamp, MD  aspirin 81 MG tablet Take 81 mg by mouth daily.    [provider]  BD PEN NEEDLE NANO U/F 32G X 4 MM MISC  10/30/14   [provider]  carvedilol (COREG) 12.5 MG tablet TAKE 1 TABLET (12.5MG  TOTAL) BY MOUTH TWICE A DAY WITH MEALS 10/30/22   Tolia, Sunit, DO  chlorthalidone (HYGROTON) 25 MG tablet TAKE 1 TABLET BY MOUTH EVERY DAY IN THE MORNING 03/16/23   Tolia, Sunit, DO  Evolocumab (REPATHA SURECLICK) 140 MG/ML SOAJ INJECT 140 MG INTO THE SKIN EVERY 14 (FOURTEEN) DAYS. 04/23/23   Yates Decamp, MD  fenofibrate micronized (LOFIBRA) 200 MG capsule Take 200 mg by mouth daily. 01/04/22   [provider]  ibuprofen (ADVIL,MOTRIN) 400 MG tablet Take 400 mg by mouth every 6 (six) hours as needed for pain.    [provider]  isosorbide-hydrALAZINE (BIDIL) 20-37.5 MG tablet TAKE 1 TABLET BY MOUTH THREE TIMES A DAY 12/11/22   Tolia, Sunit, DO  levothyroxine (SYNTHROID) 175  MCG tablet Take 350 mcg by mouth daily. 11/27/21   [provider]  loratadine (CLARITIN) 10 MG tablet Take 10 mg by mouth daily.    [provider]  losartan (COZAAR) 100 MG tablet Take 1 tablet (100 mg total) by mouth daily at 10 pm. 03/03/22 04/07/23  Tolia, Sunit, DO  metFORMIN (GLUCOPHAGE-XR) 500 MG 24 hr tablet Take 2,000 mg by mouth 2 (two) times daily. 09/06/14   [provider]  [facility] DELICA LANCETS 33G MISC 4 (four) times daily. for testing 12/21/14   [provider]  [facility] VERIO test strip 4 (four) times daily. for testing 12/21/14   [provider]  rosuvastatin (CRESTOR) 40 MG tablet Take 40 mg by mouth daily.    [provider]  Semaglutide, 1 MG/DOSE, (OZEMPIC, 1 MG/DOSE,) 4 MG/3ML SOPN Inject into the skin.    [provider]  TRESIBA FLEXTOUCH 200 UNIT/ML FlexTouch Pen Inject 50 Units into the skin in the morning and at bedtime. 12/02/21   [provider]      Allergies    Pravastatin sodium    Review of Systems   Review of Systems  Constitutional:  Negative for chills and fever.  HENT:  Negative for congestion.   Eyes:  Negative for visual disturbance.  Respiratory:  Negative for shortness of breath.   Cardiovascular:  Negative for chest pain.  Gastrointestinal:  Negative for abdominal pain and vomiting.  Genitourinary:  Negative for dysuria and flank pain.  Musculoskeletal:  Positive for neck pain. Negative for back pain and neck stiffness.  Skin:  Negative for rash.  Neurological:  Positive for light-headedness and headaches.    Physical Exam Updated Vital Signs BP 136/68   Pulse (!) 52   Temp (!) 97.5 F (36.4 C) (Oral)   Resp 10   Ht 6\' 3"  (1.905 m)   Wt 106.6 kg   SpO2 100%   BMI 29.37 kg/m  Physical Exam Vitals and nursing note reviewed.  Constitutional:      General: He is not in acute distress.    Appearance: He is well-developed.  HENT:     Head: Normocephalic and atraumatic.      Mouth/Throat:     Mouth: Mucous membranes are dry.  Eyes:     General:        Right eye: No discharge.        Left eye: No discharge.     Conjunctiva/sclera: Conjunctivae normal.  Neck:     Trachea: No tracheal deviation.     Comments: Patient is full range of motion head and neck without significant discomfort or difficulty.  No meningismus.  No midline cervical tenderness. Cardiovascular:     Rate and Rhythm: Normal rate and regular rhythm.  Pulmonary:     Effort: Pulmonary effort is normal.     Breath sounds: Normal breath sounds.  Abdominal:     General: There is no distension.     Palpations: Abdomen is soft.     Tenderness: There is no abdominal tenderness. There is no guarding.  Musculoskeletal:     Cervical back: Normal range of motion and neck supple. No rigidity.  Skin:    General: Skin is warm.     Capillary Refill: Capillary refill takes less than 2 seconds.     Findings: No rash.  Neurological:     General: No focal deficit present.     Mental Status: He is alert.     Cranial Nerves: No cranial nerve deficit.     Sensory: No sensory deficit.     Motor: No weakness.     Coordination: Coordination normal.  Psychiatric:        Mood and Affect: Mood normal.     ED Results / Procedures / Treatments   Labs (all labs ordered are listed, but only abnormal results are displayed) Labs Reviewed  CBC WITH DIFFERENTIAL/PLATELET - Abnormal; Notable for the following components:      Result Value   RBC 3.89 (*)    Hemoglobin 12.1 (*)    HCT 35.7 (*)    All other components within normal limits  BASIC METABOLIC PANEL - Abnormal; Notable for the following components:   Glucose, Bld 175 (*)    BUN 30 (*)    Creatinine, Ser 2.02 (*)    GFR, Estimated 36 (*)    All other components within normal limits  SEDIMENTATION RATE    EKG EKG Interpretation  Date/Time:  Friday May 08 2023 12:51:10 EDT Ventricular Rate:  51 PR Interval:  206 QRS Duration: 92 QT  Interval:  422 QTC Calculation: 389 R Axis:   33 Text Interpretation: Sinus rhythm Posterior infarct, old Confirmed by Blane Ohara [phone]) on 05/08/2023 1:38:57 PM  Radiology No results found.  Procedures Procedures    Medications Ordered in ED Medications  sodium chloride 0.9 % bolus 500 mL (has no administration in time  range)  sodium chloride 0.9 % bolus 500 mL (500 mLs Intravenous New Bag/Given 05/08/23 1405)    ED Course/ Medical Decision Making/ A&P                             Medical Decision Making Amount and/or Complexity of Data Reviewed Labs: ordered.   Patient presents with multiple different mild symptoms over the past few weeks.  Patient here in the ER primarily because he felt lightheaded and his blood pressure was mid 80s on 2 rechecks at home.  Patient has been taking for blood pressure meds and last oral intake.  Patient describes orthostatic symptoms worse with standing.  Patient has no evidence of anemia or bleeding.  General blood work ordered and independently reviewed hemoglobin white blood cell count, stable, hemoglobin 12.  Creatinine elevated 2.0, last creatinine 1.3 in December. Patient proved on reassessment. Repeat IV fluid bolus given due to symptomatic/dehydration and elevated kidney function.  Inflammatory markers sent and discussed follow-up with ENT, low concern for temporal arteritis is no significant temple pain on exam, no jaw symptoms and patient has evidence of dehydration and other causes.  Inflammatory markers are negative no need to follow-up with ENT.  Patient will be discharged after second IV fluid bolus for follow-up next week.  Discussed increased water intake and holding carvedilol until he sees his doctor.           Final Clinical Impression(s) / ED Diagnoses Final diagnoses:  Orthostatic hypotension  Headache, temporal  Dehydration  Acute renal insufficiency    Rx / DC Orders ED Discharge Orders     None          Blane Ohara, MD 05/08/23 1539

## 2023-05-10 ENCOUNTER — Encounter: Payer: Self-pay | Admitting: Cardiology

## 2023-05-11 NOTE — Telephone Encounter (Signed)
from pt

## 2023-05-13 NOTE — Telephone Encounter (Signed)
Thanks for the update.   Recently the weather has been really hot so avoid prolonged time in hot weather and your BP can come down due to vasodilation of blood vessel.    I would continue coreg.   At your age goal SBP should be  Reduce chlorthalidone to 12.5 mg p.o. daily.   Repeat labs in one week w/ either me or PCP.   Medical assistance:  If he will follow up w/ [place] order and release BMP in one week.   Davaun Quintela [place], DO, [facility]

## 2023-05-16 ENCOUNTER — Other Ambulatory Visit: Payer: Self-pay | Admitting: Cardiology

## 2023-05-16 DIAGNOSIS — I10 Essential (primary) hypertension: Secondary | ICD-10-CM

## 2023-05-26 ENCOUNTER — Other Ambulatory Visit: Payer: Self-pay

## 2023-05-26 DIAGNOSIS — I2584 Coronary atherosclerosis due to calcified coronary lesion: Secondary | ICD-10-CM

## 2023-05-26 DIAGNOSIS — E781 Pure hyperglyceridemia: Secondary | ICD-10-CM

## 2023-05-26 DIAGNOSIS — E7800 Pure hypercholesterolemia, unspecified: Secondary | ICD-10-CM

## 2023-05-27 ENCOUNTER — Encounter: Payer: Self-pay | Admitting: Cardiology

## 2023-05-29 ENCOUNTER — Other Ambulatory Visit (HOSPITAL_COMMUNITY): Payer: Self-pay | Admitting: Orthopedic Surgery

## 2023-05-29 LAB — CMP14+EGFR
ALT: 20 IU/L (ref 0–44)
AST: 25 IU/L (ref 0–40)
Albumin: 4.6 g/dL (ref 3.9–4.9)
Alkaline Phosphatase: 38 IU/L — ABNORMAL LOW (ref 44–121)
BUN/Creatinine Ratio: 21 (ref 10–24)
BUN: 28 mg/dL — ABNORMAL HIGH (ref 8–27)
Bilirubin Total: 0.2 mg/dL (ref 0.0–1.2)
CO2: 24 mmol/L (ref 20–29)
Calcium: 10.5 mg/dL — ABNORMAL HIGH (ref 8.6–10.2)
Chloride: 105 mmol/L (ref 96–106)
Creatinine, Ser: 1.35 mg/dL — ABNORMAL HIGH (ref 0.76–1.27)
Globulin, Total: 2.3 g/dL (ref 1.5–4.5)
Glucose: 77 mg/dL (ref 70–99)
Potassium: 4.8 mmol/L (ref 3.5–5.2)
Sodium: 144 mmol/L (ref 134–144)
Total Protein: 6.9 g/dL (ref 6.0–8.5)
eGFR: 59 mL/min/{1.73_m2} — ABNORMAL LOW (ref >59)

## 2023-05-29 LAB — LIPID PANEL WITH LDL/HDL RATIO
Cholesterol, Total: 161 mg/dL (ref 100–199)
HDL: 48 mg/dL (ref >39)
LDL Chol Calc (NIH): 78 mg/dL (ref 0–99)
LDL/HDL Ratio: 1.6 ratio (ref 0.0–3.6)
Triglycerides: 210 mg/dL — ABNORMAL HIGH (ref 0–149)
VLDL Cholesterol Cal: 35 mg/dL (ref 5–40)

## 2023-05-29 LAB — LDL CHOLESTEROL, DIRECT: LDL Direct: 68 mg/dL (ref 0–99)

## 2023-06-04 ENCOUNTER — Encounter (HOSPITAL_BASED_OUTPATIENT_CLINIC_OR_DEPARTMENT_OTHER): Payer: Self-pay | Admitting: Orthopedic Surgery

## 2023-06-04 ENCOUNTER — Other Ambulatory Visit: Payer: Self-pay

## 2023-06-04 NOTE — Progress Notes (Signed)
   06/04/23 1348  Pre-op Phone Call  Surgery Date Verified 06/11/23  Arrival Time Verified 1000  Surgery Location Verified [facility]  Medical History Reviewed Yes  Is the patient taking a GLP-1 receptor agonist? Yes (stopped taking a month ago (not sure he will go back on it even after surgery))  Has the patient been informed on holding medication? Yes  Does the patient have diabetes? Type II  Does the patient use a Continuous Blood Glucose Monitor? Yes (dexcom)  Is the patient on an insulin pump? No  Has the diabetes coordinator been notified? No  Do you have a history of heart problems?  (cardiologist: Dr Odis Hollingshead ("stress test and other testing negative" per pt))  Does patient have other implanted devices? No  Patient Teaching Enhanced Recovery  Patient educated about smoking cessation 24 hours prior to surgery. N/A Non-Smoker  Med Rec Completed Yes  Take the Following Meds the Morning of Surgery hold ASA x1week (per pt, per MD); take 1/2 dose tresiba evening before; take amlodipine and carvedilol AM sx with sip water.  Recent  Lab Work, EKG, CXR? Yes  NPO (Including gum & candy) After midnight  Patient instructed to stop clear liquids including Carb loading drink at: 0800  Stop Solids, Milk, Candy, and Gum STARTING AT MIDNIGHT  Responsible adult to drive and be with you for 24 hours? Yes  Name & Phone Number for Ride/Caregiver wife, Marcelino Duster will provide transportation and 24 hr care  No Jewelry, money, nail polish or make-up.  No lotions, powders, perfumes. No shaving  48 hrs. prior to surgery. Yes  Contacts, Dentures & Glasses Will Have to be Removed Before OR. Yes  Please bring your ID and Insurance Card the morning of your surgery. (Surgery Centers Only) Yes  Bring any papers or x-rays with you that your surgeon gave you. Yes  Instructed to contact the location of procedure/ provider if they or anyone in their household develops symptoms or tests positive for COVID-19, has close  contact with someone who tests positive for COVID, or has known exposure to any contagious illness. Yes  Call this number the morning of surgery  with any problems that may cancel your surgery. [phone]  Covid-19 Assessment  Have you had a positive COVID-19 test within the previous 90 days? No (per pt, COVID + in January, no residual)  COVID Testing Guidance Proceed with the additional questions.  Patient's surgery required a COVID-19 test (cardiothoracic, complex ENT, and bronchoscopies/ EBUS) No  Have you been unmasked and in close contact with anyone with COVID-19 or COVID-19 symptoms within the past 10 days? No  Do you or anyone in your household currently have any COVID-19 symptoms? No

## 2023-06-04 NOTE — Progress Notes (Signed)
Reviewed chart with Dr Hyacinth Meeker who said OK to proceed as scheduled with surgery

## 2023-06-06 MED ORDER — VASCEPA 1 G PO CAPS
2.0000 g | ORAL_CAPSULE | Freq: Two times a day (BID) | ORAL | 1 refills | Status: DC
Start: 2023-06-06 — End: 2023-07-09

## 2023-06-06 NOTE — Progress Notes (Signed)
Lab Results  Component Value Date   CHOL 161 05/28/2023   HDL 48 05/28/2023   LDLCALC 78 05/28/2023   LDLDIRECT 68 05/28/2023   TRIG 210 (H) 05/28/2023   CHOLHDL 2.8 10/16/2022   Lipids are still not at goal despite being on maximally tolerated doses of fenofibrate.  He currently has held off on Ozempic due to GI issues.  Shared decision was to transition to Vascepa.  Will send a prescription for Vascepa to the pharmacy.  Once he is on Vascepa for 6 weeks he is aware to have labs rechecked to reevaluate lipids and LFTs.  He may have these labs with either myself or his endocrinologist Dr. Talmage Nap.   Telephone encounter: 6 minutes  Damein Gaunce [place], DO, [facility]

## 2023-06-10 NOTE — Anesthesia Preprocedure Evaluation (Signed)
Anesthesia Evaluation  Patient identified by MRN, date of birth, ID band Patient awake    Reviewed: Allergy & Precautions, H&P , NPO status , Patient's Chart, lab work & pertinent test results  Airway Mallampati: II  TM Distance: >3 FB Neck ROM: Full    Dental no notable dental hx. (+) Teeth Intact, Dental Advisory Given   Pulmonary former smoker   Pulmonary exam normal breath sounds clear to auscultation       Cardiovascular hypertension, Normal cardiovascular exam Rhythm:Regular Rate:Normal     Neuro/Psych negative neurological ROS  negative psych ROS   GI/Hepatic negative GI ROS, Neg liver ROS,,,  Endo/Other  diabetesHypothyroidism    Renal/GU negative Renal ROS  negative genitourinary   Musculoskeletal negative musculoskeletal ROS (+)    Abdominal Normal abdominal exam  (+)   Peds negative pediatric ROS (+)  Hematology negative hematology ROS (+) Lab Results      Component                Value               Date                      WBC                      4.7                 05/08/2023                HGB                      12.1 (L)            05/08/2023                HCT                      35.7 (L)            05/08/2023                MCV                      91.8                05/08/2023                PLT                      218                 05/08/2023              Anesthesia Other Findings   Reproductive/Obstetrics negative OB ROS                             Anesthesia Physical Anesthesia Plan  ASA: 3  Anesthesia Plan: Regional and General   Post-op Pain Management: Regional block*, Minimal or no pain anticipated, Precedex, Tylenol PO (pre-op)* and Toradol IV (intra-op)*   Induction: Intravenous  PONV Risk Score and Plan: 3 and Propofol infusion, Treatment may vary due to age or medical condition, TIVA, Ondansetron and Midazolam  Airway Management Planned:  LMA  Additional Equipment: None  Intra-op Plan:   Post-operative Plan:   Informed Consent: I have reviewed the patients History  and Physical, chart, labs and discussed the procedure including the risks, benefits and alternatives for the proposed anesthesia with the patient or authorized representative who has indicated his/her understanding and acceptance.     Dental advisory given  Plan Discussed with:   Anesthesia Plan Comments: (LMA TIVA c L POP)       Anesthesia Quick Evaluation

## 2023-06-11 ENCOUNTER — Ambulatory Visit (HOSPITAL_BASED_OUTPATIENT_CLINIC_OR_DEPARTMENT_OTHER): Payer: 59 | Admitting: Anesthesiology

## 2023-06-11 ENCOUNTER — Encounter (HOSPITAL_BASED_OUTPATIENT_CLINIC_OR_DEPARTMENT_OTHER): Payer: Self-pay | Admitting: Orthopedic Surgery

## 2023-06-11 ENCOUNTER — Other Ambulatory Visit: Payer: Self-pay | Admitting: Cardiology

## 2023-06-11 ENCOUNTER — Other Ambulatory Visit: Payer: Self-pay

## 2023-06-11 ENCOUNTER — Ambulatory Visit (HOSPITAL_BASED_OUTPATIENT_CLINIC_OR_DEPARTMENT_OTHER)
Admission: RE | Admit: 2023-06-11 | Discharge: 2023-06-11 | Disposition: A | Discharge status: Home / Self Care | Payer: 59 | Attending: Orthopedic Surgery | Admitting: Orthopedic Surgery

## 2023-06-11 ENCOUNTER — Encounter (HOSPITAL_BASED_OUTPATIENT_CLINIC_OR_DEPARTMENT_OTHER)
Admission: RE | Disposition: A | Discharge status: Home / Self Care | Payer: Self-pay | Source: Home / Self Care | Attending: Orthopedic Surgery

## 2023-06-11 DIAGNOSIS — Z87891 Personal history of nicotine dependence: Secondary | ICD-10-CM | POA: Insufficient documentation

## 2023-06-11 DIAGNOSIS — S86312A Strain of muscle(s) and tendon(s) of peroneal muscle group at lower leg level, left leg, initial encounter: Secondary | ICD-10-CM | POA: Diagnosis not present

## 2023-06-11 DIAGNOSIS — X58XXXA Exposure to other specified factors, initial encounter: Secondary | ICD-10-CM | POA: Insufficient documentation

## 2023-06-11 DIAGNOSIS — E1169 Type 2 diabetes mellitus with other specified complication: Secondary | ICD-10-CM

## 2023-06-11 DIAGNOSIS — E119 Type 2 diabetes mellitus without complications: Secondary | ICD-10-CM

## 2023-06-11 DIAGNOSIS — S93692A Other sprain of left foot, initial encounter: Secondary | ICD-10-CM | POA: Diagnosis not present

## 2023-06-11 DIAGNOSIS — E039 Hypothyroidism, unspecified: Secondary | ICD-10-CM | POA: Insufficient documentation

## 2023-06-11 DIAGNOSIS — Z7984 Long term (current) use of oral hypoglycemic drugs: Secondary | ICD-10-CM

## 2023-06-11 DIAGNOSIS — I10 Essential (primary) hypertension: Secondary | ICD-10-CM

## 2023-06-11 DIAGNOSIS — M659 Synovitis and tenosynovitis, unspecified: Secondary | ICD-10-CM | POA: Insufficient documentation

## 2023-06-11 DIAGNOSIS — Z419 Encounter for procedure for purposes other than remedying health state, unspecified: Secondary | ICD-10-CM

## 2023-06-11 DIAGNOSIS — Z09 Encounter for follow-up examination after completed treatment for conditions other than malignant neoplasm: Secondary | ICD-10-CM | POA: Insufficient documentation

## 2023-06-11 HISTORY — PX: TENDON REPAIR: SHX5111

## 2023-06-11 HISTORY — PX: REPAIR OF PERONEUS BREVIS TENDON: SHX6215

## 2023-06-11 LAB — GLUCOSE, CAPILLARY
Glucose-Capillary: 110 mg/dL — ABNORMAL HIGH (ref 70–99)
Glucose-Capillary: 101 mg/dL — ABNORMAL HIGH (ref 70–99)

## 2023-06-11 SURGERY — REPAIR, TENDON, PERONEUS BREVIS
Anesthesia: Regional | Site: Ankle | Laterality: Left

## 2023-06-11 MED ORDER — ONDANSETRON HCL 4 MG/2ML IJ SOLN: 4.0000 mg | Freq: Once | INTRAMUSCULAR | Status: DC | PRN

## 2023-06-11 MED ORDER — FENTANYL CITRATE (PF) 100 MCG/2ML IJ SOLN
INTRAMUSCULAR | Status: AC
  Filled 2023-06-11: qty 2

## 2023-06-11 MED ORDER — MIDAZOLAM HCL 2 MG/2ML IJ SOLN
INTRAMUSCULAR | Status: AC
  Filled 2023-06-11: qty 2

## 2023-06-11 MED ORDER — CEFAZOLIN SODIUM-DEXTROSE 2-4 GM/100ML-% IV SOLN
INTRAVENOUS | Status: AC
  Filled 2023-06-11: qty 100

## 2023-06-11 MED ORDER — ONDANSETRON HCL 4 MG/2ML IJ SOLN
INTRAMUSCULAR | Status: AC
  Filled 2023-06-11: qty 2

## 2023-06-11 MED ORDER — OXYCODONE HCL 5 MG/5ML PO SOLN: 5.0000 mg | Freq: Once | ORAL | Status: DC | PRN

## 2023-06-11 MED ORDER — DEXAMETHASONE SODIUM PHOSPHATE 10 MG/ML IJ SOLN
INTRAMUSCULAR | Status: AC
  Filled 2023-06-11: qty 1

## 2023-06-11 MED ORDER — BUPIVACAINE HCL (PF) 0.5 % IJ SOLN
INTRAMUSCULAR | Status: DC | PRN
  Administered 2023-06-11: 15 mL via PERINEURAL

## 2023-06-11 MED ORDER — MIDAZOLAM HCL 2 MG/2ML IJ SOLN
1.0000 mg | Freq: Once | INTRAMUSCULAR | Status: AC
  Administered 2023-06-11: 1 mg via INTRAVENOUS

## 2023-06-11 MED ORDER — FENTANYL CITRATE (PF) 100 MCG/2ML IJ SOLN
INTRAMUSCULAR | Status: DC | PRN
  Administered 2023-06-11: 50 ug via INTRAVENOUS

## 2023-06-11 MED ORDER — HYDROMORPHONE HCL 1 MG/ML IJ SOLN: 0.2500 mg | INTRAMUSCULAR | Status: DC | PRN

## 2023-06-11 MED ORDER — LACTATED RINGERS IV SOLN: INTRAVENOUS | Status: DC

## 2023-06-11 MED ORDER — PROPOFOL 500 MG/50ML IV EMUL
INTRAVENOUS | Status: DC | PRN
  Administered 2023-06-11: 200 ug/kg/min via INTRAVENOUS

## 2023-06-11 MED ORDER — PROPOFOL 10 MG/ML IV BOLUS
INTRAVENOUS | Status: DC | PRN
  Administered 2023-06-11: 200 mg via INTRAVENOUS

## 2023-06-11 MED ORDER — OXYCODONE HCL 5 MG PO TABS: 5.0000 mg | ORAL_TABLET | Freq: Once | ORAL | Status: DC | PRN

## 2023-06-11 MED ORDER — BUPIVACAINE LIPOSOME 1.3 % IJ SUSP
INTRAMUSCULAR | Status: DC | PRN
  Administered 2023-06-11: 10 mL via PERINEURAL

## 2023-06-11 MED ORDER — SODIUM CHLORIDE 0.9 % IV SOLN: INTRAVENOUS | Status: DC

## 2023-06-11 MED ORDER — DEXAMETHASONE SODIUM PHOSPHATE 10 MG/ML IJ SOLN
INTRAMUSCULAR | Status: DC | PRN
  Administered 2023-06-11: 5 mg via INTRAVENOUS

## 2023-06-11 MED ORDER — ONDANSETRON HCL 4 MG/2ML IJ SOLN
INTRAMUSCULAR | Status: DC | PRN
Start: 2023-06-11 — End: 2023-06-11
  Administered 2023-06-11: 4 mg via INTRAVENOUS

## 2023-06-11 MED ORDER — OXYCODONE HCL 5 MG PO TABS: 5.0000 mg | ORAL_TABLET | Freq: Four times a day (QID) | ORAL | 0 refills | Status: AC | PRN

## 2023-06-11 MED ORDER — LIDOCAINE 2% (20 MG/ML) 5 ML SYRINGE
INTRAMUSCULAR | Status: DC | PRN
  Administered 2023-06-11: 100 mg via INTRAVENOUS

## 2023-06-11 MED ORDER — CEFAZOLIN SODIUM-DEXTROSE 2-4 GM/100ML-% IV SOLN
2.0000 g | INTRAVENOUS | Status: AC
  Administered 2023-06-11: 2 g via INTRAVENOUS

## 2023-06-11 MED ORDER — KETOROLAC TROMETHAMINE 30 MG/ML IJ SOLN: 30.0000 mg | Freq: Once | INTRAMUSCULAR | Status: DC | PRN

## 2023-06-11 MED ORDER — FENTANYL CITRATE (PF) 100 MCG/2ML IJ SOLN
50.0000 ug | Freq: Once | INTRAMUSCULAR | Status: AC
  Administered 2023-06-11: 50 ug via INTRAVENOUS

## 2023-06-11 MED ORDER — PROPOFOL 10 MG/ML IV BOLUS
INTRAVENOUS | Status: AC
  Filled 2023-06-11: qty 20

## 2023-06-11 SURGICAL SUPPLY — 78 items
APL PRP STRL LF DISP 70% ISPRP (MISCELLANEOUS) ×1
BANDAGE ESMARK 6X9 LF (GAUZE/BANDAGES/DRESSINGS) IMPLANT
BLADE AVERAGE 25X9 (BLADE) IMPLANT
BLADE MINI RND TIP GREEN BEAV (BLADE) IMPLANT
BLADE SURG 15 STRL LF DISP TIS (BLADE) ×2 IMPLANT
BLADE SURG 15 STRL SS (BLADE) ×2
BNDG CMPR 5X4 KNIT ELC UNQ LF (GAUZE/BANDAGES/DRESSINGS) ×1
BNDG CMPR 6 X 5 YARDS HK CLSR (GAUZE/BANDAGES/DRESSINGS) ×1
BNDG CMPR 9X4 STRL LF SNTH (GAUZE/BANDAGES/DRESSINGS)
BNDG CMPR 9X6 STRL LF SNTH (GAUZE/BANDAGES/DRESSINGS)
BNDG ELASTIC 4INX 5YD STR LF (GAUZE/BANDAGES/DRESSINGS) IMPLANT
BNDG ELASTIC 6INX 5YD STR LF (GAUZE/BANDAGES/DRESSINGS) IMPLANT
BNDG ESMARK 4X9 LF (GAUZE/BANDAGES/DRESSINGS) IMPLANT
BNDG ESMARK 6X9 LF (GAUZE/BANDAGES/DRESSINGS)
CHLORAPREP W/TINT 26 (MISCELLANEOUS) ×1 IMPLANT
COVER BACK TABLE 60X90IN (DRAPES) ×1 IMPLANT
CUFF TOURN SGL QUICK 34 (TOURNIQUET CUFF) ×1
CUFF TRNQT CYL 34X4.125X (TOURNIQUET CUFF) ×1 IMPLANT
DRAPE EXTREMITY T 121X128X90 (DISPOSABLE) ×1 IMPLANT
DRAPE OEC MINIVIEW 54X84 (DRAPES) IMPLANT
DRAPE U-SHAPE 47X51 STRL (DRAPES) IMPLANT
DRSG MEPITEL 4X7.2 (GAUZE/BANDAGES/DRESSINGS) ×1 IMPLANT
ELECT REM PT RETURN 9FT ADLT (ELECTROSURGICAL) ×1
ELECTRODE REM PT RTRN 9FT ADLT (ELECTROSURGICAL) ×1 IMPLANT
GAUZE PAD ABD 8X10 STRL (GAUZE/BANDAGES/DRESSINGS) ×2 IMPLANT
GAUZE SPONGE 4X4 12PLY STRL (GAUZE/BANDAGES/DRESSINGS) ×1 IMPLANT
GLOVE BIO SURGEON STRL SZ8 (GLOVE) ×1 IMPLANT
GLOVE BIOGEL PI IND STRL 7.0 (GLOVE) IMPLANT
GLOVE BIOGEL PI IND STRL 8 (GLOVE) ×2 IMPLANT
GLOVE ECLIPSE 8.0 STRL XLNG CF (GLOVE) ×1 IMPLANT
GLOVE SURG SS PI 7.0 STRL IVOR (GLOVE) IMPLANT
GOWN STRL REUS W/ TWL LRG LVL3 (GOWN DISPOSABLE) ×1 IMPLANT
GOWN STRL REUS W/ TWL XL LVL3 (GOWN DISPOSABLE) ×2 IMPLANT
GOWN STRL REUS W/TWL LRG LVL3 (GOWN DISPOSABLE) ×1
GOWN STRL REUS W/TWL XL LVL3 (GOWN DISPOSABLE) ×1
IMPL TAPESTRY BIOINTEGR 40X30 (Mesh General) IMPLANT
K-WIRE DBL .062X4 NSTRL (WIRE)
KWIRE DBL .062X4 NSTRL (WIRE) IMPLANT
NDL HYPO 22X1.5 SAFETY MO (MISCELLANEOUS) IMPLANT
NDL SUT 6 .5 CRC .975X.05 MAYO (NEEDLE) IMPLANT
NEEDLE HYPO 22X1.5 SAFETY MO (MISCELLANEOUS)
NEEDLE MAYO TAPER (NEEDLE)
NS IRRIG 1000ML POUR BTL (IV SOLUTION) ×1 IMPLANT
PACK BASIN DAY SURGERY FS (CUSTOM PROCEDURE TRAY) ×1 IMPLANT
PAD CAST 4YDX4 CTTN HI CHSV (CAST SUPPLIES) ×1 IMPLANT
PADDING CAST ABS COTTON 4X4 ST (CAST SUPPLIES) IMPLANT
PADDING CAST COTTON 4X4 STRL (CAST SUPPLIES) ×1
PADDING CAST COTTON 6X4 STRL (CAST SUPPLIES) IMPLANT
PASSER SUT SWANSON 36MM LOOP (INSTRUMENTS) IMPLANT
PENCIL SMOKE EVACUATOR (MISCELLANEOUS) ×1 IMPLANT
RETRIEVER SUT HEWSON (MISCELLANEOUS) IMPLANT
SANITIZER HAND PURELL FF 515ML (MISCELLANEOUS) ×1 IMPLANT
SCOTCHCAST PLUS 4X4 WHITE (CAST SUPPLIES) IMPLANT
SHEET MEDIUM DRAPE 40X70 STRL (DRAPES) ×1 IMPLANT
SLEEVE SCD COMPRESS KNEE MED (STOCKING) ×1 IMPLANT
SPIKE FLUID TRANSFER (MISCELLANEOUS) IMPLANT
SPLINT PLASTER CAST FAST 5X30 (CAST SUPPLIES) IMPLANT
SPONGE T-LAP 18X18 ~~LOC~~+RFID (SPONGE) ×1 IMPLANT
STOCKINETTE 6 STRL (DRAPES) ×1 IMPLANT
SUCTION TUBE FRAZIER 10FR DISP (SUCTIONS) ×1 IMPLANT
SUT ETHIBOND 0 MO6 C/R (SUTURE) IMPLANT
SUT ETHIBOND 2 OS 4 DA (SUTURE) IMPLANT
SUT ETHIBOND 2-0 V-5 NDL (SUTURE) IMPLANT
SUT ETHIBOND 2-0 V-5 NEEDLE (SUTURE)
SUT ETHILON 3 0 PS 1 (SUTURE) ×1 IMPLANT
SUT FIBERWIRE 2-0 18 17.9 3/8 (SUTURE)
SUT MERSILENE 2.0 SH NDLE (SUTURE) IMPLANT
SUT MNCRL AB 3-0 PS2 18 (SUTURE) ×1 IMPLANT
SUT VIC AB 2-0 SH 27 (SUTURE) ×1
SUT VIC AB 2-0 SH 27XBRD (SUTURE) IMPLANT
SUT VICRYL 0 SH 27 (SUTURE) IMPLANT
SUT VICRYL 0 UR6 27IN ABS (SUTURE) IMPLANT
SUTURE FIBERWR 2-0 18 17.9 3/8 (SUTURE) IMPLANT
SYR BULB EAR ULCER 3OZ GRN STR (SYRINGE) ×1 IMPLANT
SYR CONTROL 10ML LL (SYRINGE) IMPLANT
TOWEL GREEN STERILE FF (TOWEL DISPOSABLE) ×1 IMPLANT
TUBE CONNECTING 20X1/4 (TUBING) ×1 IMPLANT
UNDERPAD 30X36 HEAVY ABSORB (UNDERPADS AND DIAPERS) ×1 IMPLANT

## 2023-06-11 NOTE — Anesthesia Postprocedure Evaluation (Signed)
Anesthesia Post Note  Patient: Frederick Robinson  Procedure(s) Performed: REPAIR OF PERONEUS BREVIS TENOLYSIS (Left: Ankle) LEFT PERONEUS LONGUS REPAIR (Left: Ankle)     Patient location during evaluation: PACU Anesthesia Type: Regional and General Level of consciousness: awake and alert Pain management: pain level controlled Vital Signs Assessment: post-procedure vital signs reviewed and stable Respiratory status: spontaneous breathing, nonlabored ventilation, respiratory function stable and patient connected to nasal cannula oxygen Cardiovascular status: blood pressure returned to baseline and stable Postop Assessment: no apparent nausea or vomiting Anesthetic complications: no   No notable events documented.  Last Vitals:  Vitals:   06/11/23 1345 06/11/23 1400  BP: 129/64 (!) 139/92  Pulse: (!) 58 60  Resp: 11 16  Temp:  (!) 36.3 C  SpO2: 96% 97%    Last Pain:  Vitals:   06/11/23 1400  TempSrc: Temporal  PainSc: 0-No pain                 Trevor Iha

## 2023-06-11 NOTE — Progress Notes (Signed)
Assisted Dr. Houser with left, popliteal, ultrasound guided block. Side rails up, monitors on throughout procedure. See vital signs in flow sheet. Tolerated Procedure well. 

## 2023-06-11 NOTE — Discharge Instructions (Addendum)
Toni Arthurs, MD [facility]  Please read the following information regarding your care after surgery.  Medications  You only need a prescription for the narcotic pain medicine (ex. oxycodone, Percocet, Norco).  All of the other medicines listed below are available over the counter. X Aleve 2 pills twice a day for the first 3 days after surgery. X acetominophen (Tylenol) 650 mg every 4-6 hours as you need for minor to moderate pain X oxycodone as prescribed for severe pain  Narcotic pain medicine (ex. oxycodone, Percocet, Vicodin) will cause constipation.  To prevent this problem, take the following medicines while you are taking any pain medicine. X docusate sodium (Colace) 100 mg twice a day X senna (Senokot) 2 tablets twice a day  X To help prevent blood clots, take a baby aspirin (81 mg) twice a day for two weeks after surgery.  You should also get up every hour while you are awake to move around.    Weight Bearing X Do not bear any weight on the operated leg or foot.  Cast / Splint / Dressing X Keep your splint, cast or dressing clean and dry.  Don't put anything (coat hanger, pencil, etc) down inside of it.  If it gets damp, use a hair dryer on the cool setting to dry it.  If it gets soaked, call the office to schedule an appointment for a cast change.  After your dressing, cast or splint is removed; you may shower, but do not soak or scrub the wound.  Allow the water to run over it, and then gently pat it dry.  Swelling It is normal for you to have swelling where you had surgery.  To reduce swelling and pain, keep your toes above your nose for at least 3 days after surgery.  It may be necessary to keep your foot or leg elevated for several weeks.  If it hurts, it should be elevated.  Follow Up Call my office at [phone] when you are discharged from the hospital or surgery center to schedule an appointment to be seen two weeks after surgery.  Call my office at [phone] if  you develop a fever >101.5 F, nausea, vomiting, bleeding from the surgical site or severe pain.       Post Anesthesia Home Care Instructions  Activity: Get plenty of rest for the remainder of the day. A responsible individual must stay with you for 24 hours following the procedure.  For the next 24 hours, DO NOT: -Drive a car -Advertising copywriter -Drink alcoholic beverages -Take any medication unless instructed by your physician -Make any legal decisions or sign important papers.  Meals: Start with liquid foods such as gelatin or soup. Progress to regular foods as tolerated. Avoid greasy, spicy, heavy foods. If nausea and/or vomiting occur, drink only clear liquids until the nausea and/or vomiting subsides. Call your physician if vomiting continues.  Special Instructions/Symptoms: Your throat may feel dry or sore from the anesthesia or the breathing tube placed in your throat during surgery. If this causes discomfort, gargle with warm salt water. The discomfort should disappear within 24 hours.  If you had a scopolamine patch placed behind your ear for the management of post- operative nausea and/or vomiting:  1. The medication in the patch is effective for 72 hours, after which it should be removed.  Wrap patch in a tissue and discard in the trash. Wash hands thoroughly with soap and water. 2. You may remove the patch earlier than 72 hours if you  experience unpleasant side effects which may include dry mouth, dizziness or visual disturbances. 3. Avoid touching the patch. Wash your hands with soap and water after contact with the patch.

## 2023-06-11 NOTE — Transfer of Care (Signed)
Immediate Anesthesia Transfer of Care Note  Patient: Frederick Robinson  Procedure(s) Performed: REPAIR OF PERONEUS BREVIS TENOLYSIS (Left: Ankle) LEFT PERONEUS LONGUS REPAIR (Left: Ankle)  Patient Location: PACU  Anesthesia Type:General and Regional  Level of Consciousness: awake, alert , and oriented  Airway & Oxygen Therapy: Patient Spontanous Breathing and Patient connected to face mask oxygen  Post-op Assessment: Report given to RN and Post -op Vital signs reviewed and stable  Post vital signs: Reviewed and stable  Last Vitals:  Vitals Value Taken Time  BP 126/70 06/11/23 1321  Temp    Pulse 62 06/11/23 1323  Resp 13 06/11/23 1323  SpO2 100 % 06/11/23 1323  Vitals shown include unfiled device data.  Last Pain:  Vitals:   06/11/23 1024  TempSrc: Temporal  PainSc: 0-No pain      Patients Stated Pain Goal: 6 (06/11/23 1024)  Complications: No notable events documented.

## 2023-06-11 NOTE — Anesthesia Procedure Notes (Signed)
Procedure Name: LMA Insertion Date/Time: 06/11/2023 12:19 PM  Performed by: Burna Cash, CRNAPre-anesthesia Checklist: Patient identified, Emergency Drugs available, Suction available and Patient being monitored Patient Re-evaluated:Patient Re-evaluated prior to induction Oxygen Delivery Method: Circle system utilized Preoxygenation: Pre-oxygenation with 100% oxygen Induction Type: IV induction Ventilation: Mask ventilation without difficulty LMA: LMA inserted LMA Size: 5.0 Number of attempts: 1 Airway Equipment and Method: Bite block Placement Confirmation: positive ETCO2 Tube secured with: Tape Dental Injury: Teeth and Oropharynx as per pre-operative assessment

## 2023-06-11 NOTE — Anesthesia Procedure Notes (Signed)
Anesthesia Regional Block: Popliteal block   Pre-Anesthetic Checklist: , timeout performed,  Correct Patient, Correct Site, Correct Laterality,  Correct Procedure, Correct Position, site marked,  Risks and benefits discussed,  Pre-op evaluation,  At surgeon's request and post-op pain management  Laterality: Lower and Left  Prep: Maximum Sterile Barrier Precautions used, chloraprep       Needles:  Injection technique: Single-shot  Needle Type: Echogenic Needle     Needle Length: 9cm  Needle Gauge: 21     Additional Needles:   Procedures:,,,, ultrasound used (permanent image in chart),,    Narrative:  Start time: 06/11/2023 11:18 AM End time: 06/11/2023 11:23 AM Injection made incrementally with aspirations every 5 mL.  Performed by: Personally  Anesthesiologist: Trevor Iha, MD  Additional Notes: Block assessed. Patient tolerated procedure well.

## 2023-06-11 NOTE — Op Note (Signed)
06/11/2023  1:24 PM  PATIENT:  Frederick Robinson  62 y.o. male  PRE-OPERATIVE DIAGNOSIS: 1.  Left peroneus longus tendon tear      2.  Left peroneus brevis tenosynovitis  POST-OPERATIVE DIAGNOSIS:   1.  Left peroneus longus tendon tear      2.  Left peroneus brevis tenosynovitis      3.  Left calcaneus hypertrophic peroneal tubercle  Procedure(s):  Left peroneus longus reconstruction with allograft Left peroneus brevis tenolysis Excision of hypertrophic peroneal tubercle from the lateral calcaneus  SURGEON:  Toni Arthurs, MD  ASSISTANT: none  ANESTHESIA:   General, regional  EBL:  minimal   TOURNIQUET:   Total Tourniquet Time Documented: Thigh (Left) - 39 minutes Total: Thigh (Left) - 39 minutes  COMPLICATIONS:  None apparent  DISPOSITION:  Extubated, awake and stable to recovery.  INDICATION FOR PROCEDURE: 62 year old male with past medical history significant for diabetes complains of left ankle pain worsening over the last several months.  An MRI reveals a rupture of the peroneus longus tendon as well as synovitis of the peroneus brevis.  He presents now for operative treatment of this tendon injury.  The risks and benefits of the alternative treatment options have been discussed in detail.  The patient wishes to proceed with surgery and specifically understands risks of bleeding, infection, nerve damage, blood clots, need for additional surgery, amputation and death.   PROCEDURE IN DETAIL:  After pre operative consent was obtained, and the correct operative site was identified, the patient was brought to the operating room and placed supine on the OR table.  Anesthesia was administered.  Pre-operative antibiotics were administered.  A surgical timeout was taken.  The left lower extremity was prepped and draped in standard sterile fashion with a tourniquet around the thigh.  The extremity was elevated, and the tourniquet was inflated to 250 mmHg.  An incision was then made along the  course of the peroneals from the fifth metatarsal base to the tip of the fibula.  Dissection was carried sharply down through the subcutaneous tissues.  Care was taken to protect branches of the sural nerve.  The tendon sheath was incised over the peroneus brevis.  Dissection was carried distally to the base of the fifth metatarsal.  The peroneus brevis was noted to have significant tenosynovitis but no tear.  The tenosynovitis was excised sharply with scissors.  The peroneus longus tendon was identified at the tip of the fibula.  It was dissected free distally to the tunnel deep to the cuboid.  The rupture was identified.  There was significant scarring of the tendon.  Also noted was a very prominent hypertrophic peroneal tubercle off of the lateral wall of the calcaneus.  This was debrided back to a flush surface with a rondure and then smoothed with a rasp.  The longus tendon was carefully debrided of all tenosynovitis.  The ruptured portion of the tendon was excised in its entirety.  The tendon was then reconstructed with horizontal mattress sutures of 0 Vicryl.  Running 0 Vicryl was then placed along the leading and trailing edges of the tendon repair site.  A tapestry allograft tendon augment was then wrapped around the tendon and sewn in place with Monocryl.  The wound was irrigated copiously.  The superior peroneal retinaculum was repaired through drill holes in the fibula.  Subcutaneous tissues were approximated with Monocryl.  The incision was closed with 3-0 nylon.  Sterile dressings were applied followed by a well-padded short leg  splint.  The tourniquet was released after application of dressings.  The patient was awakened from anesthesia and transported to the recovery room in stable condition.   FOLLOW UP PLAN: Nonweightbearing on the left lower extremity in a short leg splint.  Follow-up in the office in 2 weeks for suture removal and conversion to a cam boot for early weightbearing and gentle  active plantarflexion and dorsiflexion range of motion.  Aspirin for DVT prophylaxis.

## 2023-06-11 NOTE — H&P (Signed)
Frederick Robinson is an 62 y.o. male.   Chief Complaint: Left ankle pain HPI: 62 year old male complains of swelling and pain in his left lateral ankle and hindfoot after feeling a pop.  An MRI reveals a peroneus longus tendon rupture.  He has some tenosynovitis at the peroneus brevis as well.  He presents today for operative treatment of this tendon rupture.  Past Medical History:  Diagnosis Date   Diabetes mellitus without complication (HCC)    Hyperlipidemia    Hypertension    Melanoma (HCC)    Thyroid disease     Past Surgical History:  Procedure Laterality Date   COLONOSCOPY N/A 11/07/2014   Procedure: COLONOSCOPY;  Surgeon: Dalia Heading, MD;  Location: [facility];  Service: Gastroenterology;  Laterality: N/A;  830   KNEE SURGERY Right     Family History  Problem Relation Age of Onset   Cancer Mother    Heart disease Father    Heart attack Father    Cancer Father        liver cancer   Diabetes Father    Cancer Brother 75   Social History:  reports that he quit smoking about 13 years ago. His smoking use included cigars and cigarettes. He started smoking about 38 years ago. He has a 50 pack-year smoking history. He has never used smokeless tobacco. He reports current alcohol use of about 10.0 standard drinks of alcohol per week. He reports that he does not use drugs.  Allergies:  Allergies  Allergen Reactions   Pravastatin Sodium     REACTION: HIVES    No medications prior to admission.    No results found for this or any previous visit (from the past 48 hour(s)). No results found.  Review of Systems no recent fever, chills, nausea, vomiting or changes in his appetite  Height 6\' 3"  (1.905 m), weight 108.9 kg. Physical Exam  Well-nourished well-developed and in no apparent distress.  Alert and oriented.  Normal mood and affect.  Gait is antalgic to the left.  The left foot is plantigrade.  5 out of 5 strength in plantarflexion and inversion.  He has some swelling  laterally as well as tenderness along the lateral wall of the calcaneus.   Assessment/Plan Left peroneus longus tendon rupture and peroneus brevis tenosynovitis -to the operating room today for peroneus longus tendon repair or transfer and peroneus brevis tenolysis.  The risks and benefits of the alternative treatment options have been discussed in detail.  The patient wishes to proceed with surgery and specifically understands risks of bleeding, infection, nerve damage, blood clots, need for additional surgery, amputation and death.   Toni Arthurs, MD 2023/06/14, 9:11 AM

## 2023-06-12 ENCOUNTER — Encounter (HOSPITAL_BASED_OUTPATIENT_CLINIC_OR_DEPARTMENT_OTHER): Payer: Self-pay | Admitting: Orthopedic Surgery

## 2023-06-12 DIAGNOSIS — T8189XA Other complications of procedures, not elsewhere classified, initial encounter: Secondary | ICD-10-CM | POA: Insufficient documentation

## 2023-06-19 ENCOUNTER — Encounter: Payer: Self-pay | Admitting: Cardiology

## 2023-06-22 ENCOUNTER — Encounter: Payer: Self-pay | Admitting: Internal Medicine

## 2023-06-22 ENCOUNTER — Other Ambulatory Visit: Payer: Self-pay | Admitting: Cardiology

## 2023-06-22 ENCOUNTER — Other Ambulatory Visit: Payer: Self-pay | Admitting: Internal Medicine

## 2023-06-22 ENCOUNTER — Ambulatory Visit (INDEPENDENT_AMBULATORY_CARE_PROVIDER_SITE_OTHER): Payer: 59 | Admitting: Internal Medicine

## 2023-06-22 VITALS — BP 147/72 | HR 54 | Temp 97.5°F | Ht 75.0 in | Wt 240.0 lb

## 2023-06-22 DIAGNOSIS — E1169 Type 2 diabetes mellitus with other specified complication: Secondary | ICD-10-CM

## 2023-06-22 DIAGNOSIS — N189 Chronic kidney disease, unspecified: Secondary | ICD-10-CM | POA: Diagnosis not present

## 2023-06-22 DIAGNOSIS — Z85820 Personal history of malignant melanoma of skin: Secondary | ICD-10-CM

## 2023-06-22 DIAGNOSIS — E039 Hypothyroidism, unspecified: Secondary | ICD-10-CM

## 2023-06-22 DIAGNOSIS — R0683 Snoring: Secondary | ICD-10-CM

## 2023-06-22 DIAGNOSIS — R453 Demoralization and apathy: Secondary | ICD-10-CM

## 2023-06-22 DIAGNOSIS — L97521 Non-pressure chronic ulcer of other part of left foot limited to breakdown of skin: Secondary | ICD-10-CM

## 2023-06-22 DIAGNOSIS — S8991XD Unspecified injury of right lower leg, subsequent encounter: Secondary | ICD-10-CM

## 2023-06-22 DIAGNOSIS — E785 Hyperlipidemia, unspecified: Secondary | ICD-10-CM

## 2023-06-22 DIAGNOSIS — E1165 Type 2 diabetes mellitus with hyperglycemia: Secondary | ICD-10-CM | POA: Diagnosis not present

## 2023-06-22 DIAGNOSIS — I1A0 Resistant hypertension: Secondary | ICD-10-CM

## 2023-06-22 DIAGNOSIS — Z8042 Family history of malignant neoplasm of prostate: Secondary | ICD-10-CM

## 2023-06-22 DIAGNOSIS — E11621 Type 2 diabetes mellitus with foot ulcer: Secondary | ICD-10-CM

## 2023-06-22 DIAGNOSIS — I10 Essential (primary) hypertension: Secondary | ICD-10-CM

## 2023-06-22 DIAGNOSIS — R413 Other amnesia: Secondary | ICD-10-CM

## 2023-06-22 DIAGNOSIS — Z794 Long term (current) use of insulin: Secondary | ICD-10-CM

## 2023-06-22 DIAGNOSIS — S8990XA Unspecified injury of unspecified lower leg, initial encounter: Secondary | ICD-10-CM | POA: Insufficient documentation

## 2023-06-22 NOTE — Assessment & Plan Note (Signed)
With a recent A1c of 6.2 and a history of uncontrolled diabetes, he remains on Metformin and Tresiba. The current diabetes management will continue, and we will consider restarting Jardiance for kidney protection after discussions with an Endocrinologist and Nephrologist.  We are requesting records due to care everywhere doesn't give good notes for Dr. Talmage Nap

## 2023-06-22 NOTE — Patient Instructions (Addendum)
VISIT SUMMARY:  During your visit, we discussed several health concerns including fluctuating blood pressure, potential kidney disease, diabetes management, a recent foot burn, and memory loss. We also discussed your recent ankle surgery and your ongoing monitoring for melanoma. We have made plans to address each of these issues, which are detailed below.  YOUR PLAN:  -CHRONIC KIDNEY DISEASE: Your blood tests and kidney duplex suggest you may have chronic kidney disease, which is a condition where the kidneys do not work as well as they should. We will continue your current medication, Losartan, for kidney protection and consider changing your other blood pressure medication, Chlorthalidone, to protect your kidneys and manage your blood pressure. We will also refer you to a kidney specialist (Nephrologist) for further evaluation. We will recheck kidney ultrasound  -HYPERTENSION: You have been experiencing fluctuating blood pressure, which is a condition where your blood pressure is not stable and can be too high or too low. We will continue your current blood pressure medications and consider changing Chlorthalidone based on your blood pressure control and kidney function. During episodes of low blood pressure, you are advised to consume salty foods or drinks.  -TYPE 2 DIABETES MELLITUS: You have type 2 diabetes, a condition where your body does not use insulin properly. Your recent blood tests show that your diabetes is well-controlled with your current medications, Metformin and Tresiba. We will continue this treatment and consider adding Jardiance, another diabetes medication, for kidney protection after discussions with an Endocrinologist and Nephrologist.  -SLEEP APNEA: You have reported discomfort when sleeping on your back, which suggests you may have sleep apnea, a condition where your breathing stops and starts while you sleep. We have ordered a sleep study to confirm this and advise you to  continue using your CPAP machine every night.  -FOOT WOUND: You have a burn wound on your left foot, which could become infected due to your diabetes. You are advised to apply antibiotic ointment daily, monitor the wound for signs of infection, and wear slippers in the kitchen to prevent further injury.  -MEMORY LOSS: You have reported memory loss and are currently taking Paroxetine, a medication that can help with this. We have referred you to a Neurologist for further evaluation and advise you to continue taking Paroxetine as prescribed.  -PROSTATE CANCER SCREENING: Your last prostate cancer screening test was five years ago. We have ordered a new test to check for any changes.  -GENERAL HEALTH MAINTENANCE: We will continue to monitor you for recurrence of Stage 0 Melanoma, a type of skin cancer. We are also considering a consultation with a nutritionist to help modify your diet to protect your kidneys. We have scheduled a follow-up appointment in a few weeks to review your lab work and blood pressure control.  INSTRUCTIONS:  Please continue to take your current medications as prescribed. Apply antibiotic ointment to your foot wound daily and monitor it for signs of infection. Wear slippers in the kitchen to prevent further injury. Use your CPAP machine every night. Consume salty foods or drinks during episodes of low blood pressure. We will be scheduling appointments for you with a Nephrologist and a Neurologist for further evaluation. We will also be scheduling a sleep study and a prostate cancer screening test. Please return for a follow-up appointment in a few weeks to review your lab work and blood pressure control.   Welcome aboard!   Today's visit was a valuable first step in understanding your health and starting your personalized care  journey. We discussed your medical history and medications in detail. Given the extensive information, we prioritized addressing your most pressing  concerns.  We understood those concerns to be:  New Patient (Initial Visit), Wound on left foot (Wants to be sure it is healing well.), Sleep apnea referral, Recheck kidney function levels, and Medication Refill (SSRI.)   Building a Complete Picture  To create the most effective care plan possible, we may need additional information from previous providers. We encouraged you to gather any relevant medical records for your next visit. This will help [place] build a more complete picture and develop a personalized plan together. In the meantime, we'll address your immediate concerns and provide resources to help you manage all of your medical issues.  We encourage you to use MyChart to review these efforts, and to help [place] find and correct any omissions or errors in your medical chart.  Managing Your Health Over Time  Managing every aspect of your health in a single visit isn't always feasible, but that's okay.  We addressed your most pressing concerns today and charted a course for future care. Acute conditions or preventive care measures may require further attention.  We encourage you to schedule a follow-up visit at your earliest convenience to discuss any unresolved issues.  We strongly encourage participation in annual preventive care visits to help [place] develop a more thorough understanding of your health and to help you maintain optimal wellness - please inquire about scheduling your next one with [place] at your earliest convenience.  Your Satisfaction Matters  It was a pleasure seeing you today!  Your health and satisfaction will always be my top priorities. If you believe your experience today was worthy of a 5-star rating, I'd be grateful for your feedback!  Lula Olszewski, MD   Next Steps  Schedule Follow-Up:  We recommend a follow-up appointment in No follow-ups on file. If your condition worsens before then, please call us or seek emergency care. Preventive Care:  Don't forget to schedule your  annual preventive care visit!  This important checkup is typically covered by insurance and helps identify potential health issues early.  Typically its 100% insurance covered with no co-pay and helps to get surveillance labwork paid for through your insurance provider.  Sometimes it even lowers your insurance premiums to participate. Medical Information Release:  For any relevant medical information we don't have, please sign a release form so we can obtain it for your records. Lab & X-ray Appointments:  Scheduled any incomplete lab tests today or call us to schedule.  X-Rays can be done without an appointment at [facility] ([address]), M-F 8:30am-noon or 1pm-5pm.  Just tell them you're there for X-rays ordered by Dr. Jon Billings.  We'll receive the results and contact you by phone or MyChart to discuss next steps.  Bring to Your Next Appointment  Medications: Please bring all your medication bottles to your next appointment to ensure we have an accurate record of your prescriptions. Health Diaries: If you're monitoring any health conditions at home, keeping a diary of your readings can be very helpful for discussions at your next appointment.  Reviewing Your Records  Please Review this early draft of your clinical notes below and the final encounter summary tomorrow on MyChart after its been completed.   Type 2 diabetes mellitus with hyperglycemia, with long-term current use of insulin (HCC) -     Microalbumin / creatinine urine ratio; Future  Family  history of prostate cancer -     PSA; Future  Injury of right lower extremity, subsequent encounter  FH: prostate cancer -     PSA; Future  Apathetic  Chronic kidney disease, unspecified CKD stage Assessment & Plan:  SGLT2 inhibitor:   history of bad experience with jardiance, will discuss with endo and nephrology and then make decision about restart Encouraged continuing with losartan  We continue to  educate and collaborate by involving the patient in decision-making processes to optimize prevention of progression of their kidney disease.   Our goal is to have every 3-6 month evaluation of CBC, calcium, phosphorus, A1c, urinalysis, albumin: creatinine ratio, protein: creatinine ratio, PTH, vitamin D, iron studies, and HCV serology, Renal ultrasound,  SPEP/UPEP.     For him we skip renal ultrasound due to   We continue to promote adherence to currently recommended SGLT2 and ACE/ARB therapies, while monitoring for potential side effects.   We continue to avoid or renally dose anti-infectives, atenolol, colchicine, DOACs, diabetes medications, gabapentin, levetiracetam, metoclopramide, opioids, and other relevant medications  We continue to promote good blood pressure management, preferring to use ACE/ARBs, CCBs, BBs, Nitrates, and direct renin inhibitors in order to avoid diuretics.  Dehydrating influences that raise blood pressure like alcohol are discouraged.  Calcium Channel Blockers (CCBs) are effective for lowering blood pressure in CKD patients without causing significant electrolyte imbalances. Beta-blockers can be used safely in CKD patients and are particularly beneficial in those with concurrent cardiovascular disease. Direct Renin Inhibitors like aliskiren also offer blood pressure control while providing renal protection by reducing proteinuria.  We continue to promote a low-sodium, low-protein diet, regular aerobic exercise, and adequate intake of vitamins and minerals.  We continue to assess the need for referral to a nephrologist or other specialists for advanced CKD or complex conditions: IV iron if deficient Sodium bicarbonate 650-1300mg  TID for goal HCO3 >22 Sevelamer 800mg  TID with meals for goal phosphorus <5.5 Diuretics PRN or standing for edema Progression of CKD: Rapid decline in eGFR or worsening proteinuria. Uncontrolled Hypertension: Despite optimal medical  therapy. Advanced CKD: eGFR <30 mL/min/1.73 m or preparation for renal replacement therapy (dialysis or transplant). Complications: Difficult-to-manage electrolyte imbalances, anemia, or secondary hyperparathyroidism. Rare or Complex Conditions: Suspected genetic kidney diseases, glomerulonephritis, or vasculitis. Increased cardiovascular disease risk associated with chronic kidney disease and blood pressure goals are also more aggressive.   Orders: -     Microalbumin / creatinine urine ratio; Future -     Basic metabolic panel; Future -     Ambulatory referral to Nephrology; Future -     Ambulatory referral to Sleep Studies; Future -     CBC with Differential/Platelet; Future -     Urinalysis, Routine w reflex microscopic; Future -     VITAMIN D 25 Hydroxy (Vit-D Deficiency, Fractures); Future -     Iron, TIBC and Ferritin Panel; Future -     HCV RNA quant rflx ultra or genotyp; Future -     Parathyroid hormone, intact (no Ca); Future -     Protein / creatinine ratio, urine; Future -     Phosphorus; Future -     US RENAL; Future  Hypothyroidism, unspecified type  Snoring -     Ambulatory referral to Sleep Studies; Future  Resistant hypertension -     Ambulatory referral to Sleep Studies; Future  Hyperlipidemia associated with type 2 diabetes mellitus (HCC)  Malignant melanoma, unspecified site [facility])     Getting Answers and Following  Up  Simple Questions & Concerns: For quick questions or basic follow-up after your visit, reach [place] at [phone] or MyChart messaging. Complex Concerns: If your concern is more complex, scheduling an appointment might be best. Discuss this with the staff to find the most suitable option. Lab & Imaging Results: We'll contact you directly if results are abnormal or you don't use MyChart. Most normal results will be on MyChart within 2-3 business days, with a review message from Dr. Jon Billings. Haven't heard back in 2 weeks? Need results sooner?  Contact us at [phone]. Referrals: Our referral coordinator will manage specialist referrals. The specialist's office should contact you within 2 weeks to schedule an appointment. Call us if you haven't heard from them after 2 weeks.  Staying Connected  MyChart: Activate your MyChart for the fastest way to access results and message [place]. See the last page of this paperwork for instructions.  Billing  X-ray & Lab Orders: These are billed by separate companies. Contact the invoicing company directly for questions or concerns. Visit Charges: Discuss any billing inquiries with our administrative services team.  Feedback & Satisfaction  Share Your Experience: We strive for your satisfaction! If you have any complaints, please let Dr. Jon Billings know directly or contact our Practice Administrators, Edwena Felty or [employer], by asking at the front desk.  Scheduling Tips  Shorter Wait Times: 8 am and 1 pm appointments often have the quickest wait times. Longer Appointments: If you need more time during your visit, talk to the front desk. Due to insurance regulations, multiple back-to-back appointments might be necessary.

## 2023-06-22 NOTE — Assessment & Plan Note (Signed)
As of 06/22/2023 Current hypertension medications:       Sig   amLODipine (NORVASC) 10 MG tablet (Taking) TAKE 1 TABLET (10 MG TOTAL) BY MOUTH DAILY AT 10 PM.   carvedilol (COREG) 12.5 MG tablet (Taking) TAKE 1 TABLET (12.5MG  TOTAL) BY MOUTH TWICE A DAY WITH MEALS   chlorthalidone (HYGROTON) 25 MG tablet (Taking) TAKE 1 TABLET BY MOUTH EVERY DAY IN THE MORNING   isosorbide-hydrALAZINE (BIDIL) 20-37.5 MG tablet (Taking) TAKE 1 TABLET BY MOUTH THREE TIMES A DAY   losartan (COZAAR) 100 MG tablet Take 1 tablet (100 mg total) by mouth daily at 10 pm.      Slightly uncontrolled today on these medications but was low prior... will not change medications yet, will follow up soon and get nephrology input, encouraged patient to to monitor blood pressure closely.He exhibits labile blood pressure with episodes of hypotension while on Losartan and Chlorthalidone. We will maintain the current antihypertensive regimen and evaluate the need to replace Chlorthalidone based on blood pressure control and kidney function. He is advised to consume salty foods or drinks during hypotensive episodes.

## 2023-06-22 NOTE — Assessment & Plan Note (Addendum)
SGLT2 inhibitor:   history of bad experience with jardiance, will discuss with endo and nephrology and then make decision about restart Encouraged continuing with losartan  We continue to educate and collaborate by involving the patient in decision-making processes to optimize prevention of progression of their kidney disease.   Our goal is to have every 3-6 month evaluation of CBC, calcium, phosphorus, A1c, urinalysis, albumin: creatinine ratio, protein: creatinine ratio, PTH, vitamin D, iron studies, and HCV serology, Renal ultrasound,  SPEP/UPEP.    Elevated BUN and creatinine levels and a renal duplex can indicate chronic kidney disease, likely due to uncontrolled diabetes and hypertension. We will continue Losartan for kidney protection and consider replacing Chlorthalidone to avoid potential kidney damage and manage labile blood pressure. A referral to Nephrology for further evaluation and management is necessary. Discussion about potentially restarting Jardiance with both an Endocrinologist and Nephrologist will also take place.  Our plans are to:  promote adherence to currently recommended SGLT2 and ACE/ARB therapies, while monitoring for potential side effects.   avoid or renally dose anti-infectives, atenolol, colchicine, DOACs, diabetes medications, gabapentin, levetiracetam, metoclopramide, opioids, and other relevant medications  promote good blood pressure management, preferring to use ACE/ARBs, CCBs, BBs, Nitrates, and direct renin inhibitors in order to avoid diuretics.  Dehydrating influences that raise blood pressure like alcohol are discouraged.Calcium Channel Blockers (CCBs) are effective for lowering blood pressure in CKD patients without causing significant electrolyte imbalances.Beta-blockers can be used safely in CKD patients and are particularly beneficial in those with concurrent cardiovascular disease.Direct Renin Inhibitors like aliskiren also offer blood pressure control  while providing renal protection by reducing proteinuria.  We continue to promote a low-sodium, low-protein diet, regular aerobic exercise, and adequate intake of vitamins and minerals.  We agreed to patient request for a nephrologist or for his CKD, although his disease was not complex enough that I recommended it

## 2023-06-22 NOTE — Assessment & Plan Note (Signed)
He reports discomfort when sleeping on his back, suggesting sleep apnea may contribute to labile blood pressure. An urgent sleep study is ordered, and he is advised to continue using the CPAP machine nightly.

## 2023-06-22 NOTE — Progress Notes (Signed)
[facility]  Phone: [phone]  New patient visit  Visit Date: 06/22/2023 Patient: Frederick Robinson   DOB: 1960/12/12   62 y.o. Male  MRN: [id number] Patient Care Team: Frederick Olszewski, MD as PCP - General (Internal Medicine) Frederick Frames, MD as Referring Physician (Endocrinology) Frederick Cogan, MD as Referring Physician (Dermatology) Today's Health Care Provider: Lula Olszewski, MD   Assessment and Plan:    Frederick Robinson was seen today for new patient (initial visit), wound on left foot, sleep apnea referral, recheck kidney function levels and medication refill.  Type 2 diabetes mellitus with hyperglycemia, with long-term current use of insulin (HCC) Overview: Endocrinologist: Frederick Frames, MD Hemoglobin A1c 6.19 May 2023 Following with Dr. Talmage Robinson, initially Hemoglobin A1c 8.5-9, but every 6 months follow up   Orders: -     Microalbumin / creatinine urine ratio; Future  Family history of prostate cancer Overview: Brother died of, would like monitoring.  Orders: -     PSA; Future  Injury of right lower extremity, subsequent encounter  FH: prostate cancer Overview: Brother died of, would like monitoring.  Orders: -     PSA; Future  Apathetic  Chronic kidney disease, unspecified CKD stage Overview: Nephrologist: No care team member to display  Medicines:     ARB/ACEI:  losartan 100    SGLT2 inhibitor:   history of bad experience with jardiance, will discuss with endo and nephrology and then make decision about restart  Imaging:  Renal artery duplex  03/20/2022:  No evidence of renal artery occlusive disease in either renal artery. Mild  increase in bilateral resistivity index and suggests medico-renal disease.   Renal length is within normal limits for both kidneys.  Normal abdominal aorta flow velocities noted.   Labwork: Lab Results  Component Value Date   BUN 28 (H) 05/28/2023   BUN 30 (H) 05/08/2023   BUN 20 10/16/2022   BUN  26 (H) 08/26/2022   BUN 20 03/20/2022    Creatinine, Ser (mg/dL)  Date Value  82/95/6213 1.35 (H)  05/08/2023 2.02 (H)  10/16/2022 1.29 (H)  08/26/2022 1.52 (H)  03/20/2022 0.98  02/15/2018 1.27  08/31/2015 1.22  08/28/2015 1.18  10/29/2009 1.0  12/13/2008 1.1  08/14/2008 1.0  11/24/2007 1.4  09/16/2007 1.2  05/13/2007 1.0   No results found for: "GFR" eGFR (mL/min/1.73)  Date Value  05/28/2023 59 (L)  10/16/2022 63  03/20/2022 87    Lab Results  Component Value Date   NA 144 05/28/2023   K 4.8 05/28/2023   CL 105 05/28/2023   CO2 24 05/28/2023   CALCIUM 10.5 (H) 05/28/2023   PROT 6.9 05/28/2023   ALBUMIN 4.6 05/28/2023   HGB 12.1 (L) 05/08/2023   HCT 35.7 (L) 05/08/2023   ESRSEDRATE 8 05/08/2023   HGBA1C 6.6 02/15/2018   Lab Results  Component Value Date   COLORURINE yellow 08/14/2008   APPEARANCEUR Clear 08/14/2008   LABSPEC 1.010 08/14/2008   PHURINE 5.0 08/14/2008   HGBUR negative 08/14/2008   BILIRUBINUR negative 08/14/2008   UROBILINOGEN 0.2 08/14/2008   NITRITE negative 08/14/2008   MICROALBUR 1.9 10/29/2009    Symptoms to Watch For: changes in urination, swelling, fatigue, chest pain, or shortness of breath  Medicines to Avoid:  diuretics (fluid pills), NSAIDs, proton pump inhibitor (PPI) stomach acid reducer, contrasted imaging studies, aminoglycosides, acyclovir, phosphate-based bowel prep, baclofen  Lifestyle and Nutrition:  Limit sodium, potassium, phosphorus and protein intake, consider nutrition consult, smoking avoidance, exercise regularly, manage  weight, and reduce stress, control blood sugar, avoid dehydration (may need early emergency room visits for nausea vomiting diarrhea)  Vaccinations: flu/COVID/pneumonia/hepatitis B vaccinations are more important in kidney disease    Assessment & Plan:  SGLT2 inhibitor:   history of bad experience with jardiance, will discuss with endo and nephrology and then make decision about  restart Encouraged continuing with losartan  We continue to educate and collaborate by involving the patient in decision-making processes to optimize prevention of progression of their kidney disease.   Our goal is to have every 3-6 month evaluation of CBC, calcium, phosphorus, A1c, urinalysis, albumin: creatinine ratio, protein: creatinine ratio, PTH, vitamin D, iron studies, and HCV serology, Renal ultrasound,  SPEP/UPEP.    Elevated BUN and creatinine levels and a renal duplex can indicate chronic kidney disease, likely due to uncontrolled diabetes and hypertension. We will continue Losartan for kidney protection and consider replacing Chlorthalidone to avoid potential kidney damage and manage labile blood pressure. A referral to Nephrology for further evaluation and management is necessary. Discussion about potentially restarting Jardiance with both an Endocrinologist and Nephrologist will also take place.  Our plans are to:  promote adherence to currently recommended SGLT2 and ACE/ARB therapies, while monitoring for potential side effects.   avoid or renally dose anti-infectives, atenolol, colchicine, DOACs, diabetes medications, gabapentin, levetiracetam, metoclopramide, opioids, and other relevant medications  promote good blood pressure management, preferring to use ACE/ARBs, CCBs, BBs, Nitrates, and direct renin inhibitors in order to avoid diuretics.  Dehydrating influences that raise blood pressure like alcohol are discouraged.Calcium Channel Blockers (CCBs) are effective for lowering blood pressure in CKD patients without causing significant electrolyte imbalances.Beta-blockers can be used safely in CKD patients and are particularly beneficial in those with concurrent cardiovascular disease.Direct Renin Inhibitors like aliskiren also offer blood pressure control while providing renal protection by reducing proteinuria.  We continue to promote a low-sodium, low-protein diet, regular aerobic  exercise, and adequate intake of vitamins and minerals.  We agreed to patient request for a nephrologist or for his CKD, although his disease was not complex enough that I recommended it  Orders: -     Microalbumin / creatinine urine ratio; Future -     Basic metabolic panel; Future -     Ambulatory referral to Nephrology; Future -     Ambulatory referral to Sleep Studies; Future -     CBC with Differential/Platelet; Future -     Urinalysis, Routine w reflex microscopic; Future -     VITAMIN D 25 Hydroxy (Vit-D Deficiency, Fractures); Future -     Iron, TIBC and Ferritin Panel; Future -     HCV RNA quant rflx ultra or genotyp; Future -     Parathyroid hormone, intact (no Ca); Future -     Protein / creatinine ratio, urine; Future -     Phosphorus; Future -     US RENAL; Future  Hypothyroidism, unspecified type Overview: Endocrinologist: Frederick Frames, MD On synthroid was good, but more labile on generic, most labs with Dr. Talmage Robinson     Snoring Assessment & Plan: He reports discomfort when sleeping on his back, suggesting sleep apnea may contribute to labile blood pressure. An urgent sleep study is ordered, and he is advised to continue using the CPAP machine nightly.  Orders: -     Ambulatory referral to Sleep Studies; Future  Resistant hypertension Overview: Qualifier: Diagnosis of  By: Janit Bern    Assessment & Plan: As of 06/22/2023 Current hypertension medications:  Sig   amLODipine (NORVASC) 10 MG tablet (Taking) TAKE 1 TABLET (10 MG TOTAL) BY MOUTH DAILY AT 10 PM.   carvedilol (COREG) 12.5 MG tablet (Taking) TAKE 1 TABLET (12.5MG  TOTAL) BY MOUTH TWICE A DAY WITH MEALS   chlorthalidone (HYGROTON) 25 MG tablet (Taking) TAKE 1 TABLET BY MOUTH EVERY DAY IN THE MORNING   isosorbide-hydrALAZINE (BIDIL) 20-37.5 MG tablet (Taking) TAKE 1 TABLET BY MOUTH THREE TIMES A DAY   losartan (COZAAR) 100 MG tablet Take 1 tablet (100 mg total) by mouth daily at 10 pm.       Slightly uncontrolled today on these medications but was low prior... will not change medications yet, will follow up soon and get nephrology input, encouraged patient to to monitor blood pressure closely.He exhibits labile blood pressure with episodes of hypotension while on Losartan and Chlorthalidone. We will maintain the current antihypertensive regimen and evaluate the need to replace Chlorthalidone based on blood pressure control and kidney function. He is advised to consume salty foods or drinks during hypotensive episodes.  Orders: -     Ambulatory referral to Sleep Studies; Future  Hyperlipidemia associated with type 2 diabetes mellitus (HCC) Overview: Qualifier: Diagnosis of  By: Delia Chimes, Bárbara.Apley    Assessment & Plan: With a recent A1c of 6.2 and a history of uncontrolled diabetes, he remains on Metformin and Tresiba. The current diabetes management will continue, and we will consider restarting Jardiance for kidney protection after discussions with an Endocrinologist and Nephrologist.  We are requesting records due to care everywhere doesn't give good notes for Dr. Talmage Robinson   History of melanoma Overview: Recent, need records. Mole biopsied and confirmed Melenoma Stage-0. Mole removed subsequently and edges proved to be cancer free Patient reports it was clear margins from dermatology Dr. Charm Barges and Dr. Ether Griffins at [place].   Diabetic ulcer of toe of left foot associated with type 2 diabetes mellitus, limited to breakdown of skin (HCC)  Memory loss Assessment & Plan: He reports memory loss and is prescribed Paroxetine, raising concerns for early dementia. A referral to Neurology for further evaluation is made, and he is to continue Paroxetine as prescribed.          Foot Wound A burn wound on the left foot poses an infection risk due to diabetes. He is advised to apply antibiotic ointment daily, monitor the wound for signs of infection closely, and wear  slippers in the kitchen to prevent further injury.  Prostate Cancer Screening The last PSA test was five years ago; a new PSA test is ordered at his request  General Health Maintenance  Monitoring for recurrence of Stage 0 Melanoma will continue. A nutritionist consult for diet modification to protect the kidneys is considered. A follow-up in a few weeks is scheduled to review lab work and blood pressure control.               Clinical Presentation:    62 y.o. male for whom we understood the purpose of our visit (chief concerns/complaints) to be New Patient (Initial Visit), Wound on left foot (Wants to be sure it is healing well.), Sleep apnea referral, Recheck kidney function levels, and Medication Refill (SSRI.)   Discussed the use of AI scribe software for clinical note transcription with the patient, who gave verbal consent to proceed.  History of Present Illness   The patient, a former smoker with a history of hypertension, diabetes, and recent Stage 0 melanoma, presents with multiple concerns. The primary concern is  fluctuating blood pressure, which has been as low as 80/45 and as high as 175, causing lightheadedness and necessitating emergency care. The patient is currently on six different blood pressure medications, including losartan and chlorthalidone.  The patient also reports a history of high creatinine and BUN levels, indicating potential kidney disease. A recent CT scan showed signs of chronic kidney disease, which he attributes to past uncontrolled diabetes. The patient expresses a desire for a nephrology referral due to a family history of kidney cancer.  The patient has been managing diabetes with metformin and Tresiba, but recently discontinued Ozempic due to gastrointestinal side effects. Despite this, his A1c levels have improved from a high of 8.5 to a current level of 6.2. The patient also reports a history of high triglycerides, which have been managed down to 200  from a high of 1500.  The patient also reports a recent burn on the left foot, which is healing but remains a concern due to his diabetes. The patient is also awaiting a sleep apnea test, as he suspects this may be contributing to his fluctuating blood pressure.  The patient also mentions a history of cognitive testing due to concerns of early dementia, but results were normal. He was diagnosed with apathy and is currently on a low dose of serotonin.  Lastly, the patient reports a recent ankle surgery due to a ruptured tendon. He has been managing post-operative pain with Advil and has not required prescribed Oxycontin.        Problems: has Hypothyroidism; Diabetes mellitus, type 2 (HCC); Hyperlipidemia associated with type 2 diabetes mellitus (HCC); Resistant hypertension; Allergic rhinitis; Former smoker; Leg injury; Family history of prostate cancer; Apathetic; CKD (chronic kidney disease); Peroneal tendon rupture, left, sequela; Pain of joint of left ankle and foot; Acquired cavovarus deformity of left foot; History of melanoma; Tear of medial meniscus of knee; Tendon rupture, post-op; Snoring; and Memory loss on their problem list. Current Meds  Medication Sig   amLODipine (NORVASC) 10 MG tablet TAKE 1 TABLET (10 MG TOTAL) BY MOUTH DAILY AT 10 PM.   aspirin 81 MG tablet Take 81 mg by mouth daily.   B-D UF III MINI PEN NEEDLES 31G X 5 MM MISC Inject into the skin 2 (two) times daily.   carvedilol (COREG) 12.5 MG tablet TAKE 1 TABLET (12.5MG  TOTAL) BY MOUTH TWICE A DAY WITH MEALS   chlorthalidone (HYGROTON) 25 MG tablet TAKE 1 TABLET BY MOUTH EVERY DAY IN THE MORNING   Continuous Glucose Sensor (DEXCOM G6 SENSOR) MISC SMARTSIG:Topical Every 10 Days   Continuous Glucose Transmitter (DEXCOM G6 TRANSMITTER) MISC ONE TRANSMITTER EVERY 90 DAYS   diclofenac Sodium (VOLTAREN) 1 % GEL APPLY 2 GRAMS TO THE AFFECTED AREA(S) BY TOPICAL ROUTE 4 TIMES PER DAY   Evolocumab (REPATHA SURECLICK) 140 MG/ML  SOAJ INJECT 140 MG INTO THE SKIN EVERY 14 (FOURTEEN) DAYS.   fenofibrate micronized (LOFIBRA) 200 MG capsule daily before breakfast.   isosorbide-hydrALAZINE (BIDIL) 20-37.5 MG tablet TAKE 1 TABLET BY MOUTH THREE TIMES A DAY   levothyroxine (SYNTHROID) 112 MCG tablet Take 224 mcg by mouth daily.   levothyroxine (SYNTHROID) 175 MCG tablet Take 350 mcg by mouth daily.   loratadine (CLARITIN) 10 MG tablet Take 10 mg by mouth daily.   metFORMIN (GLUCOPHAGE-XR) 500 MG 24 hr tablet Take 2,000 mg by mouth 2 (two) times daily.   rosuvastatin (CRESTOR) 40 MG tablet Take 40 mg by mouth daily.   TRESIBA FLEXTOUCH 200 UNIT/ML FlexTouch Pen Inject 50  Units into the skin in the morning and at bedtime.   VASCEPA 1 g capsule Take 2 capsules (2 g total) by mouth 2 (two) times daily.   Allergies:   Allergies  Allergen Reactions   Pravastatin Sodium     REACTION: HIVES   Past Medical History:  has a past medical history of Arthritis (01/30/2023), Diabetes mellitus without complication (HCC), Hyperlipidemia, Hypertension, Melanoma (HCC), and Thyroid disease. Past Surgical History:   has a past surgical history that includes Knee surgery (Right); Colonoscopy (N/A, 11/07/2014); Repair of peroneus brevis tendon (Left, 06/11/2023); Tendon repair (Left, 06/11/2023); and Fracture surgery (06/11/2023). Social History:   reports that he quit smoking about 13 years ago. His smoking use included cigarettes and cigars. He started smoking about 38 years ago. He has a 50 pack-year smoking history. He has never used smokeless tobacco. He reports current alcohol use of about 16.0 standard drinks of alcohol per week. He reports that he does not use drugs. Family History:  family history includes Cancer in his father and mother; Cancer (age of onset: 32) in his brother; Diabetes in his father; Heart attack in his father; Heart disease in his father; Kidney disease in his mother; Obesity in his father.  Depression Screen and Health  Maintenance:    06/22/2023    2:20 PM 02/15/2018   10:27 AM 08/31/2015    4:39 PM 01/30/2015    9:07 AM  PHQ 2/9 Scores  PHQ - 2 Score 1 1 0 0  PHQ- 9 Score 5      Health Maintenance  Topic Date Due   Zoster Vaccines- Shingrix (1 of 2) Never done   Diabetic kidney evaluation - Urine ACR  10/29/2010   OPHTHALMOLOGY EXAM  11/17/2012   FOOT EXAM  08/05/2016   HEMOGLOBIN A1C  08/17/2018   INFLUENZA VACCINE  06/18/2023   COVID-19 Vaccine (1) 09/16/2023 (Originally 03/08/1966)   Lung Cancer Screening  08/27/2023   Diabetic kidney evaluation - eGFR measurement  05/27/2024   Colonoscopy  11/07/2024   DTaP/Tdap/Td (4 - Td or Tdap) 02/16/2028   Hepatitis C Screening  Completed   HIV Screening  Completed   HPV VACCINES  Aged Out   Immunization History  Administered Date(s) Administered   Influenza Split 09/10/2013   Influenza Whole 08/21/2008, 08/28/2009, 08/29/2009   Influenza, Seasonal, Injecte, Preservative Fre 10/11/2014   Influenza,inj,Quad PF,6+ Mos 08/31/2015, 11/13/2018   Pneumococcal Polysaccharide-23 02/15/2018   Td 11/01/2008   Tdap 10/01/2007, 02/15/2018        Objective:  Physical Exam  BP (!) 147/72 (BP Location: Left Arm, Patient Position: Sitting)   Pulse (!) 54   Temp (!) 97.5 F (36.4 C) (Temporal)   Ht 6\' 3"  (1.905 m)   Wt 240 lb (108.9 kg) Comment: patient reported, patient using crutches  SpO2 98%   BMI 30.00 kg/m  Wt Readings from Last 10 Encounters:  06/22/23 240 lb (108.9 kg)  06/11/23 236 lb 12.4 oz (107.4 kg)  05/08/23 235 lb (106.6 kg)  04/07/23 243 lb 6.4 oz (110.4 kg)  10/20/22 256 lb 12.8 oz (116.5 kg)  08/26/22 250 lb (113.4 kg)  04/01/22 246 lb (111.6 kg)  03/03/22 240 lb 12.8 oz (109.2 kg)  01/21/22 245 lb 6.4 oz (111.3 kg)  02/15/18 244 lb (110.7 kg)   Vital signs reviewed.  Nursing notes reviewed. Weight trend reviewed. Abnormalities and problem-specific physical exam findings:  toe superficial burns on diabetic foot, memory loss not  obvious walks with crutches General  Appearance:  Well developed, well nourished, well-groomed, healthy-appearing male with Body mass index is 30 kg/m. No acute distress appreciable.   Skin: Clear and well-hydrated. Pulmonary:  Normal work of breathing at rest, no respiratory distress apparent. SpO2: 98 %  Musculoskeletal: He demonstrates smooth and coordinated movements throughout all major joints.All extremities are intact.  Neurological:  Awake, alert, oriented, and engaged.  No obvious focal neurological deficits or cognitive impairments.  Sensorium seems unclouded.  Psychiatric:  Appropriate mood, pleasant and cooperative demeanor, cheerful and engaged during the exam  Reviewed Results      Results for orders placed or performed during the hospital encounter of 06/11/23  Glucose, capillary  Result Value Ref Range   Glucose-Capillary 110 (H) 70 - 99 mg/dL  Glucose, capillary  Result Value Ref Range   Glucose-Capillary 101 (H) 70 - 99 mg/dL    No results found for any visits on 06/22/23.  Recent Results (from the past 2160 hour(s))  CBC with Differential     Status: Abnormal   Collection Time: 05/08/23  1:43 PM  Result Value Ref Range   WBC 4.7 4.0 - 10.5 K/uL   RBC 3.89 (L) 4.22 - 5.81 MIL/uL   Hemoglobin 12.1 (L) 13.0 - 17.0 g/dL   HCT 16.1 (L) 09.6 - 04.5 %   MCV 91.8 80.0 - 100.0 fL   MCH 31.1 26.0 - 34.0 pg   MCHC 33.9 30.0 - 36.0 g/dL   RDW 40.9 81.1 - 91.4 %   Platelets 218 150 - 400 K/uL   nRBC 0.0 0.0 - 0.2 %   Neutrophils Relative % 50 %   Neutro Abs 2.4 1.7 - 7.7 K/uL   Lymphocytes Relative 34 %   Lymphs Abs 1.6 0.7 - 4.0 K/uL   Monocytes Relative 8 %   Monocytes Absolute 0.4 0.1 - 1.0 K/uL   Eosinophils Relative 6 %   Eosinophils Absolute 0.3 0.0 - 0.5 K/uL   Basophils Relative 1 %   Basophils Absolute 0.1 0.0 - 0.1 K/uL   Immature Granulocytes 1 %   Abs Immature Granulocytes 0.03 0.00 - 0.07 K/uL    Comment: Performed at [facility],  [address], [place], [place] [address]  Basic metabolic panel     Status: Abnormal   Collection Time: 05/08/23  1:43 PM  Result Value Ref Range   Sodium 140 135 - 145 mmol/L   Potassium 4.5 3.5 - 5.1 mmol/L   Chloride 108 98 - 111 mmol/L   CO2 23 22 - 32 mmol/L   Glucose, Bld 175 (H) 70 - 99 mg/dL    Comment: Glucose reference range applies only to samples taken after fasting for at least 8 hours.   BUN 30 (H) 8 - 23 mg/dL   Creatinine, Ser 6.21 (H) 0.61 - 1.24 mg/dL   Calcium 9.8 8.9 - 30.8 mg/dL   GFR, Estimated 36 (L) >60 mL/min    Comment: (NOTE) Calculated using the CKD-EPI Creatinine Equation (2021)    Anion gap 9 5 - 15    Comment: Performed at [facility], [address], [place], [place] [address]  Sedimentation rate     Status: None   Collection Time: 05/08/23  1:43 PM  Result Value Ref Range   Sed Rate 8 0 - 16 mm/hr    Comment: Performed at [facility], [address], [place], [place] [address]  [id number]     Status: Abnormal   Collection Time: 05/28/23 10:30 AM  Result  Value Ref Range   Glucose 77 70 - 99 mg/dL   BUN 28 (H) 8 - 27 mg/dL   Creatinine, Ser 2.53 (H) 0.76 - 1.27 mg/dL   eGFR 59 (L) >66 YQ/IHK/7.42   BUN/Creatinine Ratio 21 10 - 24   Sodium 144 134 - 144 mmol/L   Potassium 4.8 3.5 - 5.2 mmol/L   Chloride 105 96 - 106 mmol/L   CO2 24 20 - 29 mmol/L   Calcium 10.5 (H) 8.6 - 10.2 mg/dL   Total Protein 6.9 6.0 - 8.5 g/dL   Albumin 4.6 3.9 - 4.9 g/dL   Globulin, Total 2.3 1.5 - 4.5 g/dL   Bilirubin Total 0.2 0.0 - 1.2 mg/dL   Alkaline Phosphatase 38 (L) 44 - 121 IU/L   AST 25 0 - 40 IU/L   ALT 20 0 - 44 IU/L  LDL cholesterol, direct     Status: None   Collection Time: 05/28/23 10:30 AM  Result Value Ref Range   LDL Direct 68 0 - 99 mg/dL  Lipid Panel With LDL/HDL Ratio     Status: Abnormal   Collection Time: 05/28/23 10:30 AM  Result Value Ref Range   Cholesterol, Total 161 100 - 199 mg/dL    Triglycerides 595 (H) 0 - 149 mg/dL   HDL 48 >63 mg/dL   VLDL Cholesterol Cal 35 5 - 40 mg/dL   LDL Chol Calc (NIH) 78 0 - 99 mg/dL   LDL CALC COMMENT: Comment     Comment: See LDL Comment if reported.   LDL/HDL Ratio 1.6 0.0 - 3.6 ratio    Comment:                                     LDL/HDL Ratio                                             Men  Women                               1/2 Avg.Risk  1.0    1.5                                   Avg.Risk  3.6    3.2                                2X Avg.Risk  6.2    5.0                                3X Avg.Risk  8.0    6.1   Glucose, capillary     Status: Abnormal   Collection Time: 06/11/23 10:26 AM  Result Value Ref Range   Glucose-Capillary 110 (H) 70 - 99 mg/dL    Comment: Glucose reference range applies only to samples taken after fasting for at least 8 hours.  Glucose, capillary     Status: Abnormal   Collection Time: 06/11/23  1:24 PM  Result Value Ref Range   Glucose-Capillary 101 (H) 70 - 99 mg/dL  Comment: Glucose reference range applies only to samples taken after fasting for at least 8 hours.    No image results found.   No results found.      Additional notes: Initial Appointment Goals:  This initial visit focused on establishing a foundation for the patient's care. We collaboratively reviewed his medical history and medications in detail, updating the chart as shown in the encounter. Given the extensive information, we prioritized addressing his most pressing concerns, which he reported were: New Patient (Initial Visit), Wound on left foot (Wants to be sure it is healing well.), Sleep apnea referral, Recheck kidney function levels, and Medication Refill (SSRI.)  While the complexity of the patient's medical picture may necessitate further evaluation in subsequent visits, we were able to develop a preliminary care plan together. To expedite a comprehensive plan at the next visit, we encouraged the patient to gather  relevant medical records from previous providers. This collaborative approach will ensure a more complete understanding of the patient's health and inform the development of a personalized care plan. We look forward to continuing the conversation and working together with the patient on achieving his health goals.   Collaborative Documentation:  Today's encounter utilized real-time, dynamic patient engagement.  Patients actively participate by directly reviewing and assisting in updating their medical records through a shared screen. This transparency empowers patients to visually confirm chart updates made by the healthcare provider.  This collaborative approach facilitates problem management as we jointly update the problem list, problem overview, and assessment/plan. Ultimately, this process enhances chart accuracy and completeness, fostering shared decision-making, patient education, and informed consent for tests and treatments.  Collaborative Treatment Planning:  Treatment plans were discussed and reviewed in detail.  Explained medication safety and potential side effects.  Encouraged participation and answered all patient questions, confirming understanding and comfort with the plan. Encouraged patient to contact our office if they have any questions or concerns. Agreed on patient returning to office if symptoms worsen, persist, or new symptoms develop. Discussed precautions in case of needing to visit the Emergency Department.  ----------------------------------------------------- Frederick Olszewski, MD  06/22/2023 8:43 PM  [facility]:  [phone]

## 2023-06-22 NOTE — Assessment & Plan Note (Signed)
He reports memory loss and is prescribed Paroxetine, raising concerns for early dementia. A referral to Neurology for further evaluation is made, and he is to continue Paroxetine as prescribed.

## 2023-06-23 MED ORDER — LOSARTAN POTASSIUM 100 MG PO TABS
100.0000 mg | ORAL_TABLET | Freq: Every day | ORAL | 0 refills | Status: DC
Start: 2023-06-23 — End: 2024-02-16

## 2023-06-24 MED ORDER — DEXCOM G6 SENSOR MISC: 3 refills | Status: DC

## 2023-06-24 MED ORDER — DICLOFENAC SODIUM 1 % EX GEL: CUTANEOUS | 2 refills | Status: DC

## 2023-06-24 MED ORDER — DEXCOM G6 TRANSMITTER MISC: 0 refills | Status: DC

## 2023-06-24 MED ORDER — BD PEN NEEDLE MINI U/F 31G X 5 MM MISC: 5 refills | Status: DC

## 2023-06-29 NOTE — Addendum Note (Signed)
Addended by: Donzetta Starch on: 06/29/2023 01:34 PM   Modules accepted: Orders

## 2023-06-30 ENCOUNTER — Ambulatory Visit
Admission: RE | Admit: 2023-06-30 | Discharge: 2023-06-30 | Disposition: A | Discharge status: Home / Self Care | Payer: 59 | Source: Ambulatory Visit | Attending: Internal Medicine | Admitting: Internal Medicine

## 2023-06-30 DIAGNOSIS — N189 Chronic kidney disease, unspecified: Secondary | ICD-10-CM

## 2023-07-03 ENCOUNTER — Other Ambulatory Visit: Payer: 59

## 2023-07-03 NOTE — Progress Notes (Signed)
Patient here for lab only visit.  Patient of Dr. Jon Billings. Orders were not visible to lab. I do see a note to patient on 8/12 regarding lab orders.  Patient in our office today - reported to our lab tech Sena Hitch) that he went to [facility] to have labs, not visible to lab there - not able to have drawn. Scheduled for lab visit here - labs not initially visible to our lab either. Sena Hitch reports that he appeared upset, pointing finger in her face. Akera asked clinical staff about labs and I looked into concern. Labs entered in chart, but apparently not future ordered, so unable to be seen. I was in process of changing orders to future to allow lab to draw, but patient left prior to lab draw or before I was able to speak to him to discuss the issue.   Will forward to PCP and staff at [facility].

## 2023-07-03 NOTE — Addendum Note (Signed)
Addended byZoe Lan on: 07/03/2023 02:25 PM   Modules accepted: Orders

## 2023-07-06 ENCOUNTER — Other Ambulatory Visit (INDEPENDENT_AMBULATORY_CARE_PROVIDER_SITE_OTHER): Payer: 59

## 2023-07-06 DIAGNOSIS — Z794 Long term (current) use of insulin: Secondary | ICD-10-CM

## 2023-07-06 DIAGNOSIS — E1165 Type 2 diabetes mellitus with hyperglycemia: Secondary | ICD-10-CM | POA: Diagnosis not present

## 2023-07-06 DIAGNOSIS — Z8042 Family history of malignant neoplasm of prostate: Secondary | ICD-10-CM | POA: Diagnosis not present

## 2023-07-06 DIAGNOSIS — N189 Chronic kidney disease, unspecified: Secondary | ICD-10-CM | POA: Diagnosis not present

## 2023-07-06 LAB — BASIC METABOLIC PANEL
BUN: 25 mg/dL — ABNORMAL HIGH (ref 6–23)
CO2: 22 mEq/L (ref 19–32)
Calcium: 9.4 mg/dL (ref 8.4–10.5)
Chloride: 109 mEq/L (ref 96–112)
Creatinine, Ser: 1.22 mg/dL (ref 0.40–1.50)
GFR: 63.18 mL/min (ref >60.00)
Glucose, Bld: 145 mg/dL — ABNORMAL HIGH (ref 70–99)
Potassium: 4.5 mEq/L (ref 3.5–5.1)
Sodium: 140 mEq/L (ref 135–145)

## 2023-07-06 LAB — CBC WITH DIFFERENTIAL/PLATELET
Basophils Absolute: 0 10*3/uL (ref 0.0–0.1)
Basophils Relative: 0.9 % (ref 0.0–3.0)
Eosinophils Absolute: 0.3 10*3/uL (ref 0.0–0.7)
Eosinophils Relative: 6.6 % — ABNORMAL HIGH (ref 0.0–5.0)
HCT: 34.7 % — ABNORMAL LOW (ref 39.0–52.0)
Hemoglobin: 11.4 g/dL — ABNORMAL LOW (ref 13.0–17.0)
Lymphocytes Relative: 28.4 % (ref 12.0–46.0)
Lymphs Abs: 1.4 10*3/uL (ref 0.7–4.0)
MCHC: 33 g/dL (ref 30.0–36.0)
MCV: 94.1 fl (ref 78.0–100.0)
Monocytes Absolute: 0.3 10*3/uL (ref 0.1–1.0)
Monocytes Relative: 6.8 % (ref 3.0–12.0)
Neutro Abs: 2.8 10*3/uL (ref 1.4–7.7)
Neutrophils Relative %: 57.3 % (ref 43.0–77.0)
Platelets: 209 10*3/uL (ref 150.0–400.0)
RBC: 3.68 Mil/uL — ABNORMAL LOW (ref 4.22–5.81)
RDW: 13.6 % (ref 11.5–15.5)
WBC: 4.9 10*3/uL (ref 4.0–10.5)

## 2023-07-06 LAB — URINALYSIS, ROUTINE W REFLEX MICROSCOPIC
Bilirubin Urine: NEGATIVE
Hgb urine dipstick: NEGATIVE
Ketones, ur: NEGATIVE
Leukocytes,Ua: NEGATIVE
Nitrite: NEGATIVE
RBC / HPF: NONE SEEN (ref 0–?)
Specific Gravity, Urine: 1.015 (ref 1.000–1.030)
Total Protein, Urine: NEGATIVE
Urine Glucose: NEGATIVE
Urobilinogen, UA: 1 (ref 0.0–1.0)
pH: 7 (ref 5.0–8.0)

## 2023-07-06 LAB — PSA: PSA: 0.54 ng/mL (ref 0.10–4.00)

## 2023-07-06 LAB — MICROALBUMIN / CREATININE URINE RATIO
Creatinine,U: 80.6 mg/dL
Microalb Creat Ratio: 0.9 mg/g (ref 0.0–30.0)
Microalb, Ur: <0.7 mg/dL (ref 0.0–1.9)

## 2023-07-06 LAB — VITAMIN D 25 HYDROXY (VIT D DEFICIENCY, FRACTURES): VITD: 14.89 ng/mL — ABNORMAL LOW (ref 30.00–100.00)

## 2023-07-06 LAB — PHOSPHORUS: Phosphorus: 3.5 mg/dL (ref 2.3–4.6)

## 2023-07-07 ENCOUNTER — Ambulatory Visit: Payer: 59 | Admitting: Internal Medicine

## 2023-07-07 LAB — HCV RNA QUANT RFLX ULTRA OR GENOTYP: HCV Quant Baseline: NOT DETECTED IU/mL

## 2023-07-07 LAB — HM HEPATITIS C SCREENING LAB: HM Hepatitis Screen: NEGATIVE

## 2023-07-09 ENCOUNTER — Ambulatory Visit (INDEPENDENT_AMBULATORY_CARE_PROVIDER_SITE_OTHER): Payer: 59 | Admitting: Internal Medicine

## 2023-07-09 ENCOUNTER — Encounter: Payer: Self-pay | Admitting: Internal Medicine

## 2023-07-09 VITALS — BP 131/68 | HR 62 | Temp 98.1°F | Ht 75.0 in

## 2023-07-09 DIAGNOSIS — N189 Chronic kidney disease, unspecified: Secondary | ICD-10-CM

## 2023-07-09 DIAGNOSIS — R7989 Other specified abnormal findings of blood chemistry: Secondary | ICD-10-CM | POA: Diagnosis not present

## 2023-07-09 DIAGNOSIS — E559 Vitamin D deficiency, unspecified: Secondary | ICD-10-CM | POA: Diagnosis not present

## 2023-07-09 DIAGNOSIS — I1A0 Resistant hypertension: Secondary | ICD-10-CM

## 2023-07-09 DIAGNOSIS — Z794 Long term (current) use of insulin: Secondary | ICD-10-CM

## 2023-07-09 DIAGNOSIS — L989 Disorder of the skin and subcutaneous tissue, unspecified: Secondary | ICD-10-CM | POA: Insufficient documentation

## 2023-07-09 DIAGNOSIS — E1169 Type 2 diabetes mellitus with other specified complication: Secondary | ICD-10-CM

## 2023-07-09 DIAGNOSIS — D539 Nutritional anemia, unspecified: Secondary | ICD-10-CM | POA: Diagnosis not present

## 2023-07-09 DIAGNOSIS — E1165 Type 2 diabetes mellitus with hyperglycemia: Secondary | ICD-10-CM

## 2023-07-09 DIAGNOSIS — E785 Hyperlipidemia, unspecified: Secondary | ICD-10-CM

## 2023-07-09 LAB — PARATHYROID HORMONE, INTACT (NO CA): PTH: 15 pg/mL — ABNORMAL LOW (ref 16–77)

## 2023-07-09 LAB — IRON,TIBC AND FERRITIN PANEL
%SAT: 20 % (ref 20–48)
Ferritin: 44 ng/mL (ref 24–380)
Iron: 75 ug/dL (ref 50–180)
TIBC: 377 ug/dL (ref 250–425)

## 2023-07-09 LAB — PROTEIN / CREATININE RATIO, URINE

## 2023-07-09 MED ORDER — ICOSAPENT ETHYL 1 G PO CAPS: 2.0000 g | ORAL_CAPSULE | Freq: Two times a day (BID) | ORAL | 32 refills | Status: DC

## 2023-07-09 NOTE — Assessment & Plan Note (Signed)
Anemia He exhibits normocytic and macrocytic anemia, likely secondary to chronic kidney disease. We covered the importance of erythropoietin and how kidney disease affects its production. His iron levels are on the lower end of normal, and he is already supplementing. We will continue iron supplementation, start B12 supplementation, and order a peripheral smear, B12, methylmalonic acid, and folate levels to further assess the anemia.

## 2023-07-09 NOTE — Progress Notes (Signed)
[facility]: [phone]   -- Routine Medical Office Visit --  Patient:  Frederick Robinson      Age: 62 y.o.       Sex:  male  Date:   07/09/2023 Patient Care Team: Lula Olszewski, MD as PCP - General (Internal Medicine) Dorisann Frames, MD as Referring Physician (Endocrinology) Malva Cogan, MD as Referring Physician (Dermatology) Today's Healthcare Provider: Lula Olszewski, MD   Assessment and Plan:     Assessment & Plan Chronic kidney disease, unspecified CKD stage    Chronic Kidney Disease Age-related kidney disease in this individual shows increased echogenicity on ultrasound, but recent tests indicate improvement in BUN and creatinine levels. We discussed the critical role of hydration in preventing further kidney damage. We will continue hydration efforts and order a 24-hour urine collection to evaluate kidney function more thoroughly. Macrocytic anemia Anemia He exhibits normocytic and macrocytic anemia, likely secondary to chronic kidney disease. We covered the importance of erythropoietin and how kidney disease affects its production. His iron levels are on the lower end of normal, and he is already supplementing. We will continue iron supplementation, start B12 supplementation, and order a peripheral smear, B12, methylmalonic acid, and folate levels to further assess the anemia. Low serum parathyroid hormone (PTH) His PTH is slightly low ?secondary to kidney disease. We discussed the kidney's role in calcium management and the potential impact of kidney disease on calcium levels, which have been stable. Vitamin D3 supplementation will continue to support activated vitamin D levels. Vitamin D deficiency We discussed the importance of vitamin D and protecting calcium and bone stores and chronic kidney disease is seems to be improving but very low vitamin D levels on recent check Type 2 diabetes mellitus with hyperglycemia, with long-term current use of  insulin (HCC) Lab Results  Component Value Date   HGBA1C 6.6 02/15/2018   HGBA1C 7.4 07/04/2016   HGBA1C 8.6 08/31/2015  Although we do not have new or A1c on file he reports he has been doing this and thyroid monitoring with his endocrinologist so I just asked for those records  Hyperlipidemia associated with type 2 diabetes mellitus (HCC) Hyperlipidemia Despite fenofibrate therapy, his triglycerides remain elevated, and he is also taking Repatha. We will attempt to obtain Vascepa (icosapent ethyl) again for triglyceride management. If Vascepa is unavailable, we will consider dietary modifications or over-the-counter fish oil supplementation. Resistant hypertension  He experiences diurnal variation in blood pressure, with higher readings in the morning. We discussed the potential influence of sleep apnea and cortisol on these fluctuations. Current management will continue with regular monitoring of blood pressure. A sleep apnea consultation is scheduled for October 1st.  Well-controlled and Continue(s) with  Current hypertension medications:       Sig   amLODipine (NORVASC) 10 MG tablet (Taking) TAKE 1 TABLET (10 MG TOTAL) BY MOUTH DAILY AT 10 PM.   carvedilol (COREG) 12.5 MG tablet (Taking) TAKE 1 TABLET (12.5MG  TOTAL) BY MOUTH TWICE A DAY WITH MEALS   chlorthalidone (HYGROTON) 25 MG tablet (Taking) TAKE 1 TABLET BY MOUTH EVERY DAY IN THE MORNING   isosorbide-hydrALAZINE (BIDIL) 20-37.5 MG tablet (Taking) TAKE 1 TABLET BY MOUTH THREE TIMES A DAY   losartan (COZAAR) 100 MG tablet (Taking) Take 1 tablet (100 mg total) by mouth daily at 10 pm.       Foot lesion He has a dark purple lesion on his foot, the size of a quarter. As a type 2 diabetic with  well-controlled blood sugars (A1c 6.2), we will monitor the lesion and report any changes or signs of infection.  I looked at images on his phone today of current status and felt its not infected- he has foot surgically protected in boot so  didn't remove.  General Health Maintenance We will maintain the current management of his type 2 diabetes, monitoring blood sugars closely. Thyroid function will continue to be monitored with an endocrinologist. Iron and B12 supplementation will persist. Vitamin D3 supplementation is considered beneficial. Hydration efforts, especially during illness, are crucial to support kidney function. Blood pressure monitoring and the upcoming sleep apnea consultation will proceed as planned. Lipid management with Repatha and efforts to obtain Vascepa or an alternative for triglyceride control will continue. The foot lesion will be monitored for any changes or signs of infection. Monitoring of PTH and calcium levels in relation to kidney function will persist.        ED Discharge Orders          Ordered    Pathologist smear review        07/09/23 1235    B12 and Folate Panel        07/09/23 1235    Methylmalonic Acid        07/09/23 1235    UPEP/TP, 24-Hr Urine        07/09/23 1235    icosapent Ethyl (VASCEPA) 1 g capsule  2 times daily        07/09/23 1244          Recommended follow up: No follow-ups on file. Future Appointments  Date Time Provider Department Center  08/18/2023 11:00 AM Dohmeier, Porfirio Mylar, MD [facility] None  10/08/2023  1:30 PM Tessa Lerner, DO [facility] None  12/10/2023  2:00 PM Lula Olszewski, MD [facility]           Clinical Presentation:    62 y.o. male who has Hypothyroidism; Diabetes mellitus, type 2 (HCC); Hyperlipidemia associated with type 2 diabetes mellitus (HCC); Resistant hypertension; Allergic rhinitis; Former smoker; Leg injury; Family history of prostate cancer; Apathetic; CKD (chronic kidney disease); Peroneal tendon rupture, left, sequela; Pain of joint of left ankle and foot; Acquired cavovarus deformity of left foot; History of melanoma; Tear of medial meniscus of knee; Tendon rupture, post-op; Snoring; Memory loss; Macrocytic anemia; Vitamin D deficiency;  Low serum parathyroid hormone (PTH); and Foot lesion on their problem list. His reasons/main concerns/chief complaints for today's office visit are 2 week follow-up and Discuss medication for elevated triglycerides (Wants to know which medication is best for this aside from fenofibrate, currently taking repatha. Or preferred medications to be approved for vascepa (sent in by Cardiologist))   ------------------------------------------------------------------------------------------------------------------------ AI-Extracted: Discussed the use of AI scribe software for clinical note transcription with the patient, who gave verbal consent to proceed.  History of Present Illness   The patient, a type 2 diabetic with a history of kidney disease and hypertension, presented with concerns about a dark purple spot on the bottom of his foot, approximately the size of a quarter. The patient's wife initially noticed the spot, and due to his diabetic status, the patient expressed concern about potential complications related to foot health. The patient's most recent HbA1c was 6.2, indicating good glycemic control.  The patient also reported a history of fluctuating blood pressure, with readings as high as 160 upon waking and dropping to below 100 within two hours. He noted feeling lightheaded during these episodes. The patient reported a history of dehydration,  which was previously linked to episodes of low blood pressure. He has since increased his water intake, which appears to have stabilized his blood pressure.  The patient has been on Synthroid for thyroid management, but due to insurance changes, he has switched to levothyroxine, which has resulted in fluctuating thyroid levels. He also reported taking Repatha for cholesterol management, with recent labs showing an improvement in his cholesterol levels.  The patient's wife noticed what she described as enlarged lymph nodes under his arm, but the patient did not  report any tenderness or discomfort in the area. He also reported a history of melanoma, which has increased his vigilance about changes in his skin.  The patient has been proactive in managing his health, including taking iron supplements for low iron levels, B complex vitamins for potential B12 deficiency, and sitting in the sun to increase vitamin D levels. He also reported an upcoming sleep apnea consultation, as he suspects this condition may be contributing to his fluctuating blood pressure.       ------------------------------------------------------------------------------------------------------------------------ He has a past medical history of Arthritis (01/30/2023), Diabetes mellitus without complication (HCC), Hyperlipidemia, Hypertension, Melanoma (HCC), and Thyroid disease.  Problem list overviews that were updated at today's visit: Problem  Macrocytic Anemia   Lab Results  Component Value Date   MCV 94.1 07/06/2023   MCV 91.8 05/08/2023   MCV 92.6 08/26/2022   Per most recent structured social history update, " reports current alcohol use of about 16.0 standard drinks of alcohol per week." Lab Results  Component Value Date   HGB 11.4 (L) 07/06/2023   No results found for: "VITAMINB12" No components found for: "MMA" No results found for: "FOLATE" Lab Results  Component Value Date   TSH 1.690 02/15/2018   TSH 0.28 (L) 10/29/2009   TSH 2.18 08/14/2008  Hegets every 6 months thyroid checks we don't have and its fine No results found for: "FREET4" No results found for: "SPEP" No results found for: "UPEP"    Latest Ref Rng & Units 05/28/2023   10:30 AM  Serum Protein Electrophoresis  Total Protein 6.0 - 8.5 g/dL 6.9    No results found for: "PATHREVIEW"      Vitamin D Deficiency  Low Serum Parathyroid Hormone (Pth)  Foot Lesion   Current Outpatient Medications on File Prior to Visit  Medication Sig   amLODipine (NORVASC) 10 MG tablet TAKE 1 TABLET (10 MG  TOTAL) BY MOUTH DAILY AT 10 PM.   aspirin 81 MG tablet Take 81 mg by mouth daily.   B-D UF III MINI PEN NEEDLES 31G X 5 MM MISC USE AS DIRECTED   carvedilol (COREG) 12.5 MG tablet TAKE 1 TABLET (12.5MG  TOTAL) BY MOUTH TWICE A DAY WITH MEALS   chlorthalidone (HYGROTON) 25 MG tablet TAKE 1 TABLET BY MOUTH EVERY DAY IN THE MORNING   Continuous Glucose Sensor (DEXCOM G6 SENSOR) MISC USE AS DIRECTED. NEED TO REPLACE EVERY 10 DAYS.   Continuous Glucose Transmitter (DEXCOM G6 TRANSMITTER) MISC USE AS DIRECTED   diclofenac Sodium (VOLTAREN) 1 % GEL USE AS DIRECTED   Evolocumab (REPATHA SURECLICK) 140 MG/ML SOAJ INJECT 140 MG INTO THE SKIN EVERY 14 (FOURTEEN) DAYS.   fenofibrate micronized (LOFIBRA) 200 MG capsule daily before breakfast.   isosorbide-hydrALAZINE (BIDIL) 20-37.5 MG tablet TAKE 1 TABLET BY MOUTH THREE TIMES A DAY   levothyroxine (SYNTHROID) 112 MCG tablet Take 224 mcg by mouth daily.   levothyroxine (SYNTHROID) 175 MCG tablet Take 350 mcg by mouth daily.  loratadine (CLARITIN) 10 MG tablet Take 10 mg by mouth daily.   losartan (COZAAR) 100 MG tablet Take 1 tablet (100 mg total) by mouth daily at 10 pm.   metFORMIN (GLUCOPHAGE-XR) 500 MG 24 hr tablet Take 2,000 mg by mouth 2 (two) times daily.   rosuvastatin (CRESTOR) 40 MG tablet Take 40 mg by mouth daily.   Semaglutide, 1 MG/DOSE, (OZEMPIC, 1 MG/DOSE,) 4 MG/3ML SOPN Inject into the skin.   TRESIBA FLEXTOUCH 200 UNIT/ML FlexTouch Pen Inject 50 Units into the skin in the morning and at bedtime.   No current facility-administered medications on file prior to visit.   Medications Discontinued During This Encounter  Medication Reason   VASCEPA 1 g capsule          Clinical Data Analysis:   Physical Exam  BP 131/68 (BP Location: Right Arm, Patient Position: Sitting)   Pulse 62   Temp 98.1 F (36.7 C) (Temporal)   Ht 6\' 3"  (1.905 m)   SpO2 98%   BMI 30.00 kg/m  Wt Readings from Last 10 Encounters:  06/22/23 240 lb (108.9 kg)   06/11/23 236 lb 12.4 oz (107.4 kg)  05/08/23 235 lb (106.6 kg)  04/07/23 243 lb 6.4 oz (110.4 kg)  10/20/22 256 lb 12.8 oz (116.5 kg)  08/26/22 250 lb (113.4 kg)  04/01/22 246 lb (111.6 kg)  03/03/22 240 lb 12.8 oz (109.2 kg)  01/21/22 245 lb 6.4 oz (111.3 kg)  02/15/18 244 lb (110.7 kg)   Vital signs reviewed.  Nursing notes reviewed. Weight trend reviewed. Abnormalities and Problem-Specific physical exam findings:   Physical Exam   NECK: .No palpable lymphadenopathy in the axillary region he was concerned someone else thought he might have, he declined ultrasound to further evaluate  SKIN: Healing surgical scar on foot without signs of infection or erythema.  General Appearance:  No acute distress appreciable.   Well-groomed, healthy-appearing male.  Well proportioned with no abnormal fat distribution.  Good muscle tone. Pulmonary:  Normal work of breathing at rest, no respiratory distress apparent. SpO2: 98 %  Musculoskeletal: All extremities are intact.  Neurological:  Awake, alert, oriented, and engaged.  No obvious focal neurological deficits or cognitive impairments.  Sensorium seems unclouded.   Speech is clear and coherent with logical content. Psychiatric:  Appropriate mood, pleasant and cooperative demeanor, thoughtful and engaged during the exam  Results Reviewed:     No results found for any visits on 07/09/23.  Lab on 07/06/2023  Component Date Value   VITD 07/06/2023 14.89 (L)    Color, Urine 07/06/2023 YELLOW    APPearance 07/06/2023 CLEAR    Specific Gravity, Urine 07/06/2023 1.015    pH 07/06/2023 7.0    Total Protein, Urine 07/06/2023 NEGATIVE    Urine Glucose 07/06/2023 NEGATIVE    Ketones, ur 07/06/2023 NEGATIVE    Bilirubin Urine 07/06/2023 NEGATIVE    Hgb urine dipstick 07/06/2023 NEGATIVE    Urobilinogen, UA 07/06/2023 1.0    Leukocytes,Ua 07/06/2023 NEGATIVE    Nitrite 07/06/2023 NEGATIVE    WBC, UA 07/06/2023 0-2/hpf    RBC / HPF 07/06/2023 none  seen    PTH 07/06/2023 15 (L)    PSA 07/06/2023 0.54    Creatinine, Urine 07/06/2023 CANCELED    Phosphorus 07/06/2023 3.5    Microalb, Ur 07/06/2023 <0.7    Creatinine,U 07/06/2023 80.6    Microalb Creat Ratio 07/06/2023 0.9    Iron 07/06/2023 75    TIBC 07/06/2023 377    %SAT 07/06/2023 20  Ferritin 07/06/2023 44    HCV Quant Baseline 07/06/2023 HCV Not Detected    HCV log10 07/06/2023 CANCELED    Test Information 07/06/2023 Comment    HCV Genotype 07/06/2023 CANCELED    WBC 07/06/2023 4.9    RBC 07/06/2023 3.68 (L)    Hemoglobin 07/06/2023 11.4 (L)    HCT 07/06/2023 34.7 (L)    MCV 07/06/2023 94.1    MCHC 07/06/2023 33.0    RDW 07/06/2023 13.6    Platelets 07/06/2023 209.0    Neutrophils Relative % 07/06/2023 57.3    Lymphocytes Relative 07/06/2023 28.4    Monocytes Relative 07/06/2023 6.8    Eosinophils Relative 07/06/2023 6.6 (H)    Basophils Relative 07/06/2023 0.9    Neutro Abs 07/06/2023 2.8    Lymphs Abs 07/06/2023 1.4    Monocytes Absolute 07/06/2023 0.3    Eosinophils Absolute 07/06/2023 0.3    Basophils Absolute 07/06/2023 0.0    Sodium 07/06/2023 140    Potassium 07/06/2023 4.5    Chloride 07/06/2023 109    CO2 07/06/2023 22    Glucose, Bld 07/06/2023 145 (H)    BUN 07/06/2023 25 (H)    Creatinine, Ser 07/06/2023 1.22    GFR 07/06/2023 63.18    Calcium 07/06/2023 9.4   Admission on 06/11/2023, Discharged on 06/11/2023  Component Date Value   Glucose-Capillary 06/11/2023 110 (H)    Glucose-Capillary 06/11/2023 101 (H)   Orders Only on 05/26/2023  Component Date Value   Glucose 05/28/2023 77    BUN 05/28/2023 28 (H)    Creatinine, Ser 05/28/2023 1.35 (H)    eGFR 05/28/2023 59 (L)    BUN/Creatinine Ratio 05/28/2023 21    Sodium 05/28/2023 144    Potassium 05/28/2023 4.8    Chloride 05/28/2023 105    CO2 05/28/2023 24    Calcium 05/28/2023 10.5 (H)    Total Protein 05/28/2023 6.9    Albumin 05/28/2023 4.6    Globulin, Total 05/28/2023 2.3     Bilirubin Total 05/28/2023 0.2    Alkaline Phosphatase 05/28/2023 38 (L)    AST 05/28/2023 25    ALT 05/28/2023 20    LDL Direct 05/28/2023 68    Cholesterol, Total 05/28/2023 161    Triglycerides 05/28/2023 210 (H)    HDL 05/28/2023 48    VLDL Cholesterol Cal 05/28/2023 35    LDL Chol Calc (NIH) 05/28/2023 78    LDL CALC COMMENT: 05/28/2023 Comment    LDL/HDL Ratio 05/28/2023 1.6   Admission on 05/08/2023, Discharged on 05/08/2023  Component Date Value   WBC 05/08/2023 4.7    RBC 05/08/2023 3.89 (L)    Hemoglobin 05/08/2023 12.1 (L)    HCT 05/08/2023 35.7 (L)    MCV 05/08/2023 91.8    MCH 05/08/2023 31.1    MCHC 05/08/2023 33.9    RDW 05/08/2023 13.1    Platelets 05/08/2023 218    nRBC 05/08/2023 0.0    Neutrophils Relative % 05/08/2023 50    Neutro Abs 05/08/2023 2.4    Lymphocytes Relative 05/08/2023 34    Lymphs Abs 05/08/2023 1.6    Monocytes Relative 05/08/2023 8    Monocytes Absolute 05/08/2023 0.4    Eosinophils Relative 05/08/2023 6    Eosinophils Absolute 05/08/2023 0.3    Basophils Relative 05/08/2023 1    Basophils Absolute 05/08/2023 0.1    Immature Granulocytes 05/08/2023 1    Abs Immature Granulocytes 05/08/2023 0.03    Sodium 05/08/2023 140    Potassium 05/08/2023 4.5    Chloride 05/08/2023 108  CO2 05/08/2023 23    Glucose, Bld 05/08/2023 175 (H)    BUN 05/08/2023 30 (H)    Creatinine, Ser 05/08/2023 2.02 (H)    Calcium 05/08/2023 9.8    GFR, Estimated 05/08/2023 36 (L)    Anion gap 05/08/2023 9    Sed Rate 05/08/2023 8   Orders Only on 10/16/2022  Component Date Value   Cholesterol, Total 10/16/2022 136    Triglycerides 10/16/2022 195 (H)    HDL 10/16/2022 48    VLDL Cholesterol Cal 10/16/2022 32    LDL Chol Calc (NIH) 10/16/2022 56    Chol/HDL Ratio 10/16/2022 2.8    Glucose 10/16/2022 62 (L)    BUN 10/16/2022 20    Creatinine, Ser 10/16/2022 1.29 (H)    eGFR 10/16/2022 63    BUN/Creatinine Ratio 10/16/2022 16    Sodium 10/16/2022 146  (H)    Potassium 10/16/2022 4.6    Chloride 10/16/2022 109 (H)    CO2 10/16/2022 21    Calcium 10/16/2022 10.0    Total Protein 10/16/2022 7.1    Albumin 10/16/2022 4.8    Globulin, Total 10/16/2022 2.3    Albumin/Globulin Ratio 10/16/2022 2.1    Bilirubin Total 10/16/2022 <0.2    Alkaline Phosphatase 10/16/2022 42 (L)    AST 10/16/2022 32    ALT 10/16/2022 25    LDL Direct 10/16/2022 49    specimen status report 10/16/2022 Comment   Admission on 08/26/2022, Discharged on 08/26/2022  Component Date Value   Sodium 08/26/2022 138    Potassium 08/26/2022 4.1    Chloride 08/26/2022 106    CO2 08/26/2022 18 (L)    Glucose, Bld 08/26/2022 177 (H)    BUN 08/26/2022 26 (H)    Creatinine, Ser 08/26/2022 1.52 (H)    Calcium 08/26/2022 9.7    GFR, Estimated 08/26/2022 51 (L)    Anion gap 08/26/2022 14    WBC 08/26/2022 6.6    RBC 08/26/2022 4.04 (L)    Hemoglobin 08/26/2022 12.8 (L)    HCT 08/26/2022 37.4 (L)    MCV 08/26/2022 92.6    MCH 08/26/2022 31.7    MCHC 08/26/2022 34.2    RDW 08/26/2022 12.8    Platelets 08/26/2022 247    nRBC 08/26/2022 0.0    Neutrophils Relative % 08/26/2022 42    Neutro Abs 08/26/2022 2.8    Lymphocytes Relative 08/26/2022 44    Lymphs Abs 08/26/2022 2.9    Monocytes Relative 08/26/2022 8    Monocytes Absolute 08/26/2022 0.5    Eosinophils Relative 08/26/2022 5    Eosinophils Absolute 08/26/2022 0.3    Basophils Relative 08/26/2022 1    Basophils Absolute 08/26/2022 0.1    Immature Granulocytes 08/26/2022 0    Abs Immature Granulocytes 08/26/2022 0.02    Troponin I (High Sensiti* 08/26/2022 12    B Natriuretic Peptide 08/26/2022 32.3    Troponin I (High Sensiti* 08/26/2022 14    No image results found.   No results found.  No results found.      Additional Notes:   This encounter employed real-time, collaborative documentation. The patient actively reviewed and updated their medical record on a shared screen, ensuring transparency and  facilitating joint problem-solving for the problem list, overview, and plan. This approach promotes accurate, informed care. The treatment plan was discussed and reviewed in detail, including medication safety, potential side effects, and all patient questions. We confirmed understanding and comfort with the plan. Follow-up instructions were established, including contacting the office for any concerns,  returning if symptoms worsen, persist, or new symptoms develop, and precautions for potential emergency department visits. ----------------------------------------------------- Lula Olszewski, MD  07/09/2023 8:17 PM  [facility]:  [phone]

## 2023-07-09 NOTE — Assessment & Plan Note (Signed)
  He experiences diurnal variation in blood pressure, with higher readings in the morning. We discussed the potential influence of sleep apnea and cortisol on these fluctuations. Current management will continue with regular monitoring of blood pressure. A sleep apnea consultation is scheduled for October 1st.  Well-controlled and Continue(s) with  Current hypertension medications:       Sig   amLODipine (NORVASC) 10 MG tablet (Taking) TAKE 1 TABLET (10 MG TOTAL) BY MOUTH DAILY AT 10 PM.   carvedilol (COREG) 12.5 MG tablet (Taking) TAKE 1 TABLET (12.5MG  TOTAL) BY MOUTH TWICE A DAY WITH MEALS   chlorthalidone (HYGROTON) 25 MG tablet (Taking) TAKE 1 TABLET BY MOUTH EVERY DAY IN THE MORNING   isosorbide-hydrALAZINE (BIDIL) 20-37.5 MG tablet (Taking) TAKE 1 TABLET BY MOUTH THREE TIMES A DAY   losartan (COZAAR) 100 MG tablet (Taking) Take 1 tablet (100 mg total) by mouth daily at 10 pm.

## 2023-07-09 NOTE — Assessment & Plan Note (Signed)
Lab Results  Component Value Date   HGBA1C 6.6 02/15/2018   HGBA1C 7.4 07/04/2016   HGBA1C 8.6 08/31/2015  Although we do not have new or A1c on file he reports he has been doing this and thyroid monitoring with his endocrinologist so I just asked for those records

## 2023-07-09 NOTE — Assessment & Plan Note (Signed)
His PTH is slightly low ?secondary to kidney disease. We discussed the kidney's role in calcium management and the potential impact of kidney disease on calcium levels, which have been stable. Vitamin D3 supplementation will continue to support activated vitamin D levels.

## 2023-07-09 NOTE — Assessment & Plan Note (Signed)
  Chronic Kidney Disease Age-related kidney disease in this individual shows increased echogenicity on ultrasound, but recent tests indicate improvement in BUN and creatinine levels. We discussed the critical role of hydration in preventing further kidney damage. We will continue hydration efforts and order a 24-hour urine collection to evaluate kidney function more thoroughly.

## 2023-07-09 NOTE — Assessment & Plan Note (Signed)
He has a dark purple lesion on his foot, the size of a quarter. As a type 2 diabetic with well-controlled blood sugars (A1c 6.2), we will monitor the lesion and report any changes or signs of infection.  I looked at images on his phone today of current status and felt its not infected- he has foot surgically protected in boot so didn't remove.

## 2023-07-09 NOTE — Assessment & Plan Note (Signed)
Hyperlipidemia Despite fenofibrate therapy, his triglycerides remain elevated, and he is also taking Repatha. We will attempt to obtain Vascepa (icosapent ethyl) again for triglyceride management. If Vascepa is unavailable, we will consider dietary modifications or over-the-counter fish oil supplementation.

## 2023-07-09 NOTE — Patient Instructions (Signed)
VISIT SUMMARY:  During your visit, we discussed several health concerns including your kidney disease, anemia, hypertension, high cholesterol, a foot lesion, and slightly low pth. We also discussed your general health maintenance, including your diabetes and thyroid management (deferred to endocrinology), and your vitamin and hydration efforts.  YOUR PLAN:  -CHRONIC KIDNEY DISEASE: Your kidneys are not working as well as they should due to age, but recent tests show some improvement. We will continue to focus on hydration and will conduct a 24-hour urine collection to further assess your kidney function.  -ANEMIA: Your red blood cell count is lower than normal, likely due to your kidney disease. We will continue your iron supplementation, start B12 supplementation, and conduct further tests to assess your anemia.  -HYPERTENSION: Your blood pressure fluctuates throughout the day, with higher readings in the morning. We will continue to monitor your blood pressure and your sleep apnea consultation is scheduled for October 1st.  -HYPERLIPIDEMIA: Despite medication, your triglyceride levels remain high. We will try to obtain Vascepa for triglyceride management. If unavailable, we will consider dietary changes or over-the-counter fish oil supplementation.  -FOOT LESION: You have a dark purple spot on your foot. As a diabetic, we will monitor this lesion and report any changes or signs of infection.  -SECONDARY HYPERPARATHYROIDISM: Your parathyroid hormone (PTH) level is slightly high, likely due to your kidney disease. We will continue your Vitamin D3 supplementation to support activated vitamin D levels.  INSTRUCTIONS:  Continue to monitor your blood pressure and blood sugar levels regularly. Maintain your hydration efforts, especially during illness, to support your kidney function. Continue your iron and B12 supplementation, and your Vitamin D3 supplementation. Your sleep apnea consultation is  scheduled for October 1st. We will continue to monitor your foot lesion for any changes or signs of infection. We will also continue to monitor your PTH and calcium levels in relation to your kidney function.

## 2023-07-09 NOTE — Assessment & Plan Note (Signed)
We discussed the importance of vitamin D and protecting calcium and bone stores and chronic kidney disease is seems to be improving but very low vitamin D levels on recent check

## 2023-07-10 ENCOUNTER — Encounter (INDEPENDENT_AMBULATORY_CARE_PROVIDER_SITE_OTHER): Payer: Self-pay

## 2023-07-10 LAB — B12 AND FOLATE PANEL
Folate: >24.2 ng/mL (ref >5.9)
Vitamin B-12: 52 pg/mL — ABNORMAL LOW (ref 211–911)

## 2023-07-13 LAB — PATHOLOGIST SMEAR REVIEW

## 2023-07-13 LAB — METHYLMALONIC ACID, SERUM: Methylmalonic Acid, Quant: 1228 nmol/L — ABNORMAL HIGH (ref 69–390)

## 2023-07-14 ENCOUNTER — Encounter: Payer: Self-pay | Admitting: Internal Medicine

## 2023-07-14 DIAGNOSIS — D519 Vitamin B12 deficiency anemia, unspecified: Secondary | ICD-10-CM | POA: Insufficient documentation

## 2023-07-14 NOTE — Progress Notes (Signed)
Urgent Follow-up: B12 Deficiency and Further Testing  Key Points:  Severe B12 deficiency confirmed: B12 52 pg/mL, MMA 1,228 nmol/L Patient questioning root cause of B12 deficiency Kidney/bladder ultrasound results pending Blood smear shows macrocytic anemia  Action Items:  Schedule urgent follow-up appointment within 1 week Arrange immediate start of B12 injections Order additional tests:  Intrinsic factor antibodies Serum gastrin levels   Check status of kidney/bladder ultrasound results Prepare for possible referral for upper endoscopy  Patient Instructions:  Start B12 injections as soon as possible (coordinate with nursing staff) Continue all other medications as prescribed Maintain current diet Report any new symptoms, particularly neurological changes or fatigue  Note: Emphasize to the patient that while we're investigating the root cause, it's crucial to start B12 treatment immediately to prevent further complications. Reassure that many causes of B12 deficiency are treatable once identified.

## 2023-07-19 ENCOUNTER — Other Ambulatory Visit: Payer: Self-pay | Admitting: Cardiology

## 2023-07-19 DIAGNOSIS — I10 Essential (primary) hypertension: Secondary | ICD-10-CM

## 2023-07-21 ENCOUNTER — Encounter: Payer: Self-pay | Admitting: Endocrinology

## 2023-07-22 ENCOUNTER — Ambulatory Visit (INDEPENDENT_AMBULATORY_CARE_PROVIDER_SITE_OTHER): Payer: 59 | Admitting: Internal Medicine

## 2023-07-22 ENCOUNTER — Encounter: Payer: Self-pay | Admitting: Internal Medicine

## 2023-07-22 VITALS — BP 132/68 | HR 52 | Temp 97.7°F | Ht 75.0 in

## 2023-07-22 DIAGNOSIS — Z1211 Encounter for screening for malignant neoplasm of colon: Secondary | ICD-10-CM

## 2023-07-22 DIAGNOSIS — N182 Chronic kidney disease, stage 2 (mild): Secondary | ICD-10-CM

## 2023-07-22 DIAGNOSIS — D539 Nutritional anemia, unspecified: Secondary | ICD-10-CM

## 2023-07-22 DIAGNOSIS — Z23 Encounter for immunization: Secondary | ICD-10-CM | POA: Diagnosis not present

## 2023-07-22 DIAGNOSIS — D518 Other vitamin B12 deficiency anemias: Secondary | ICD-10-CM

## 2023-07-22 DIAGNOSIS — R7989 Other specified abnormal findings of blood chemistry: Secondary | ICD-10-CM

## 2023-07-22 MED ORDER — B-12 1000-400 MCG SL SUBL
1.0000 | SUBLINGUAL_TABLET | Freq: Every day | SUBLINGUAL | 3 refills | Status: DC
Start: 2023-07-22 — End: 2023-07-27

## 2023-07-22 MED ORDER — CYANOCOBALAMIN 1000 MCG/ML IJ SOLN
1000.0000 ug | Freq: Once | INTRAMUSCULAR | Status: AC
Start: 2023-07-22 — End: 2023-07-22
  Administered 2023-07-22: 1000 ug via INTRAMUSCULAR

## 2023-07-22 NOTE — Assessment & Plan Note (Signed)
His kidney function has recently improved, possibly related to rehydration. We will continue increased fluid intake and repeat kidney function tests in 30 days.

## 2023-07-22 NOTE — Progress Notes (Unsigned)
[facility]: [phone]   -- Medical Office Visit --  Patient:  Frederick Robinson      Age: 62 y.o.       Sex:  male  Date:   07/22/2023 Patient Care Team: Lula Olszewski, MD as PCP - General (Internal Medicine) Dorisann Frames, MD as Referring Physician (Endocrinology) Malva Cogan, MD as Referring Physician (Dermatology) Today's Healthcare Provider: Lula Olszewski, MD     Assessment & Plan Other vitamin B12 deficiency anemia We spent most of visit reviewing this issue and management plan for it. Dietary reason could not be identified so this is assumed malabsorptive.  Will get gastrointestinal evaluation, intrinsic factor and gastrin, and Cologuard to evaluate gastrointestinal function related with B12. Cause of severe deficiency not easily identified. Alcohol use is only 2-3 cocktails 2-3x weekly  Today gave injection, arranged sl tablet. Follow up 1 month with repeat B12 to see if sublingual maintain   Confirmed by elevated methylmalonic acid, his condition likely contributes to anemia, fatigue, nerve tenderness, headaches, and memory loss, with an unclear etiology possibly related to diet or alcohol consumption. We administered a B12 injection today for an immediate boost and will start sublingual B12 supplementation. Labs are ordered in 30 days to check B12 and folate levels, intrinsic factor antibodies, and gastrin levels, with a GI referral considered depending on lab results. Macrocytic anemia Extensive workup negative for bone marrow process, just severe B12 deficiency so that is likely the sole cause, expect resolution with B12 correction Stage 2 chronic kidney disease His kidney function has recently improved, possibly related to rehydration. We will continue increased fluid intake and repeat kidney function tests in 30 days. Need for shingles vaccine Given his history of shingles three years ago and unclear vaccination status, we administered the  shingles vaccine today for additional protection. Screening for malignant neoplasm of colon With no recent colonoscopy on record, we ordered a Cologuard test. Hypercalcemia The elevated calcium levels, possibly related to Tums use while bone marrow tests normal, prompt a reduction in Tums use. We will repeat blood tests if calcium level remains elevated. Low serum parathyroid hormone (PTH)   Follow-up An appointment is scheduled 3 days after labs to discuss results and future plans.      Diagnoses and all orders for this visit: Need for shingles vaccine -     Zoster Recombinant (Shingrix ) Encounter for screening colonoscopy -     Cologuard Other vitamin B12 deficiency anemia -     Cobalamin Combinations (B-12) 1000-400 MCG SUBL; Place 1 tablet under the tongue daily. -     B12 and Folate Panel -     Basic Metabolic Panel (BMET) -     Intrinsic Factor Antibodies -     Gastrin -     Ambulatory referral to Gastroenterology -     cyanocobalamin (VITAMIN B12) injection 1,000 mcg -     B12 and Folate Panel; Future -     Basic Metabolic Panel (BMET); Future -     Intrinsic Factor Antibodies; Future -     Gastrin; Future  Recommended follow-up:1-2 months with previsit labs, to decide about B12 management going forward Future Appointments  Date Time Provider Department Center  08/17/2023 10:15 AM [facility]  08/18/2023 11:00 AM Dohmeier, Porfirio Mylar, MD [facility] None  08/20/2023  1:40 PM Lula Olszewski, MD [facility]  10/08/2023  1:30 PM Tessa Lerner, DO [facility] None  12/10/2023  2:00 PM Glenetta Hew  G, MD [facility]            Subjective   62 y.o. male who has Hypothyroidism; Diabetes mellitus, type 2 (HCC); Hyperlipidemia associated with type 2 diabetes mellitus (HCC); Resistant hypertension; Allergic rhinitis; Former smoker; Leg injury; Family history of prostate cancer; Apathetic; CKD (chronic kidney disease); Peroneal tendon rupture, left, sequela; Pain of joint  of left ankle and foot; Acquired cavovarus deformity of left foot; History of melanoma; Tear of medial meniscus of knee; Tendon rupture, post-op; Snoring; Memory loss; Macrocytic anemia; Vitamin D deficiency; Low serum parathyroid hormone (PTH); Foot lesion; and B12 deficiency anemia on their problem list. His reasons/main concerns/chief complaints for today's office visit are Review lab results   ------------------------------------------------------------------------------------------------------------------------ AI-Extracted: Discussed the use of AI scribe software for clinical note transcription with the patient, who gave verbal consent to proceed.  History of Present Illness   The patient, with a history of rheumatoid arthritis and recent diagnosis of severe vitamin B12 deficiency, presents for follow-up. He reports a significant decrease in dietary intake since starting Ozempic a year ago, which he attributes to a decrease in appetite. He also reports a history of acid reflux symptoms while on doxycycline, which he managed with Tums. However, these symptoms resolved after discontinuation of the medication.  The patient has been experiencing cognitive issues and muscle aches, which he believes may be related to his B12 deficiency. He also reports a history of shingles three years ago, which left some scarring but was not associated with significant pain.  The patient has been on Sertraline, which he obtains online, for mood management. He reports some improvement with this medication, but feels it may not be as effective currently due to his B12 deficiency and associated symptoms.  The patient also has a history of kidney issues, which improved with increased fluid intake. He reports drinking more fluids and has noticed an improvement in his kidney function. He also reports a decrease in alcohol consumption to about two to three cocktails three days a week.  The patient has been on [place], administering injections to himself. He expresses willingness to administer B12 injections at home if necessary. He also expresses a desire to undergo all necessary tests and procedures while his current insurance coverage is in effect.      He has a past medical history of Arthritis (01/30/2023), Diabetes mellitus without complication (HCC), Hyperlipidemia, Hypertension, Melanoma (HCC), and Thyroid disease.  Problem list overviews that were updated at today's visit: Problem  B12 Deficiency Anemia   Suspicious alcohol related.  Vitamin B12 deficiency (ICD-10: E53.8) Severe deficiency confirmed on [Date]: B12 52 pg/mL (L), MMA 1,228 nmol/L (H). Initiating B12 injections. Investigating root cause including possible malabsorption. Ordered intrinsic factor antibodies and serum gastrin. Consider upper endoscopy. Recheck B12 and MMA levels in 4-6 weeks after treatment initiation. Macrocytic anemia (ICD-10: D53.1) Blood smear on [Date] confirms macrocytic anemia, consistent with B12 deficiency. Expect improvement with B12 replacement therapy. Monitor hemoglobin, MCV, and overall CBC with B12 level rechecks.  Pending diagnostic workup (ICD-10: Z01.89) Investigating cause of B12 deficiency. Consider pernicious anemia, medication effects, and gastrointestinal conditions. Awaiting results of intrinsic factor antibodies and serum gastrin. May need upper endoscopy based on results.  I recommend we start B12 injections immediately to address the deficiency. At your appointment, we'll order additional tests to investigate the cause of your B12 deficiency, including:  Intrinsic factor antibodies (to check for pernicious anemia) Serum gastrin levels Possibly an upper endoscopy to examine  your stomach lining    Current Outpatient Medications on File Prior to Visit  Medication Sig   amLODipine (NORVASC) 10 MG tablet TAKE 1 TABLET (10 MG TOTAL) BY MOUTH DAILY AT 10 PM.   aspirin 81 MG tablet Take 81  mg by mouth daily.   B-D UF III MINI PEN NEEDLES 31G X 5 MM MISC USE AS DIRECTED   carvedilol (COREG) 12.5 MG tablet TAKE 1 TABLET (12.5MG  TOTAL) BY MOUTH TWICE A DAY WITH MEALS   chlorthalidone (HYGROTON) 25 MG tablet TAKE 1 TABLET BY MOUTH EVERY DAY IN THE MORNING   Continuous Glucose Sensor (DEXCOM G6 SENSOR) MISC USE AS DIRECTED. NEED TO REPLACE EVERY 10 DAYS.   Continuous Glucose Transmitter (DEXCOM G6 TRANSMITTER) MISC USE AS DIRECTED   diclofenac Sodium (VOLTAREN) 1 % GEL USE AS DIRECTED   Evolocumab (REPATHA SURECLICK) 140 MG/ML SOAJ INJECT 140 MG INTO THE SKIN EVERY 14 (FOURTEEN) DAYS.   fenofibrate micronized (LOFIBRA) 200 MG capsule daily before breakfast.   icosapent Ethyl (VASCEPA) 1 g capsule Take 2 capsules (2 g total) by mouth 2 (two) times daily.   isosorbide-hydrALAZINE (BIDIL) 20-37.5 MG tablet TAKE 1 TABLET BY MOUTH THREE TIMES A DAY   levothyroxine (SYNTHROID) 112 MCG tablet Take 224 mcg by mouth daily.   levothyroxine (SYNTHROID) 175 MCG tablet Take 350 mcg by mouth daily.   loratadine (CLARITIN) 10 MG tablet Take 10 mg by mouth daily.   losartan (COZAAR) 100 MG tablet Take 1 tablet (100 mg total) by mouth daily at 10 pm.   metFORMIN (GLUCOPHAGE-XR) 500 MG 24 hr tablet Take 2,000 mg by mouth 2 (two) times daily.   rosuvastatin (CRESTOR) 40 MG tablet Take 40 mg by mouth daily.   Semaglutide, 1 MG/DOSE, (OZEMPIC, 1 MG/DOSE,) 4 MG/3ML SOPN Inject into the skin.   TRESIBA FLEXTOUCH 200 UNIT/ML FlexTouch Pen Inject 50 Units into the skin in the morning and at bedtime.   No current facility-administered medications on file prior to visit.  There are no discontinued medications.   Objective   Physical Exam  BP 132/68 (BP Location: Left Arm, Patient Position: Sitting)   Pulse (!) 52   Temp 97.7 F (36.5 C) (Temporal)   Ht 6\' 3"  (1.905 m)   SpO2 99%   BMI 30.00 kg/m  Wt Readings from Last 10 Encounters:  06/22/23 240 lb (108.9 kg)  06/11/23 236 lb 12.4 oz (107.4 kg)   05/08/23 235 lb (106.6 kg)  04/07/23 243 lb 6.4 oz (110.4 kg)  10/20/22 256 lb 12.8 oz (116.5 kg)  08/26/22 250 lb (113.4 kg)  04/01/22 246 lb (111.6 kg)  03/03/22 240 lb 12.8 oz (109.2 kg)  01/21/22 245 lb 6.4 oz (111.3 kg)  02/15/18 244 lb (110.7 kg)   Vital signs reviewed.  Nursing notes reviewed. Weight trend reviewed. Abnormalities and Problem-Specific physical exam findings:  highly intelligent understands all his complex labwork.  General Appearance:  No acute distress appreciable.   Well-groomed, healthy-appearing male.  Well proportioned with no abnormal fat distribution.  Good muscle tone. Pulmonary:  Normal work of breathing at rest, no respiratory distress apparent. SpO2: 99 %  Musculoskeletal: All extremities are intact.  Neurological:  Awake, alert, oriented, and engaged.  No obvious focal neurological deficits or cognitive impairments.  Sensorium seems unclouded.   Speech is clear and coherent with logical content. Psychiatric:  Appropriate mood, pleasant and cooperative demeanor, thoughtful and engaged during the exam  Results   LABS Calcium: elevated Methylmalonic acid: elevated B12:  deficient S-Pep: negative U-Pep: negative BUN: improved Creatinine: improved        No results found for any visits on 07/22/23.  Scanned Document on 07/13/2023  Component Date Value   HM Hepatitis Screen 07/07/2023 Negative-Validated   Office Visit on 07/09/2023  Component Date Value   Path Review 07/09/2023     Vitamin B-12 07/09/2023 52 (L)    Folate 07/09/2023 >24.2    Methylmalonic Acid, Quant 07/09/2023 1,228 (H)   Lab on 07/06/2023  Component Date Value   VITD 07/06/2023 14.89 (L)    Color, Urine 07/06/2023 YELLOW    APPearance 07/06/2023 CLEAR    Specific Gravity, Urine 07/06/2023 1.015    pH 07/06/2023 7.0    Total Protein, Urine 07/06/2023 NEGATIVE    Urine Glucose 07/06/2023 NEGATIVE    Ketones, ur 07/06/2023 NEGATIVE    Bilirubin Urine 07/06/2023 NEGATIVE     Hgb urine dipstick 07/06/2023 NEGATIVE    Urobilinogen, UA 07/06/2023 1.0    Leukocytes,Ua 07/06/2023 NEGATIVE    Nitrite 07/06/2023 NEGATIVE    WBC, UA 07/06/2023 0-2/hpf    RBC / HPF 07/06/2023 none seen    PTH 07/06/2023 15 (L)    PSA 07/06/2023 0.54    Creatinine, Urine 07/06/2023 CANCELED    Phosphorus 07/06/2023 3.5    Microalb, Ur 07/06/2023 <0.7    Creatinine,U 07/06/2023 80.6    Microalb Creat Ratio 07/06/2023 0.9    Iron 07/06/2023 75    TIBC 07/06/2023 377    %SAT 07/06/2023 20    Ferritin 07/06/2023 44    HCV Quant Baseline 07/06/2023 HCV Not Detected    HCV log10 07/06/2023 CANCELED    Test Information 07/06/2023 Comment    HCV Genotype 07/06/2023 CANCELED    WBC 07/06/2023 4.9    RBC 07/06/2023 3.68 (L)    Hemoglobin 07/06/2023 11.4 (L)    HCT 07/06/2023 34.7 (L)    MCV 07/06/2023 94.1    MCHC 07/06/2023 33.0    RDW 07/06/2023 13.6    Platelets 07/06/2023 209.0    Neutrophils Relative % 07/06/2023 57.3    Lymphocytes Relative 07/06/2023 28.4    Monocytes Relative 07/06/2023 6.8    Eosinophils Relative 07/06/2023 6.6 (H)    Basophils Relative 07/06/2023 0.9    Neutro Abs 07/06/2023 2.8    Lymphs Abs 07/06/2023 1.4    Monocytes Absolute 07/06/2023 0.3    Eosinophils Absolute 07/06/2023 0.3    Basophils Absolute 07/06/2023 0.0    Sodium 07/06/2023 140    Potassium 07/06/2023 4.5    Chloride 07/06/2023 109    CO2 07/06/2023 22    Glucose, Bld 07/06/2023 145 (H)    BUN 07/06/2023 25 (H)    Creatinine, Ser 07/06/2023 1.22    GFR 07/06/2023 63.18    Calcium 07/06/2023 9.4   Admission on 06/11/2023, Discharged on 06/11/2023  Component Date Value   Glucose-Capillary 06/11/2023 110 (H)    Glucose-Capillary 06/11/2023 101 (H)   Orders Only on 05/26/2023  Component Date Value   Glucose 05/28/2023 77    BUN 05/28/2023 28 (H)    Creatinine, Ser 05/28/2023 1.35 (H)    eGFR 05/28/2023 59 (L)    BUN/Creatinine Ratio 05/28/2023 21    Sodium 05/28/2023 144     Potassium 05/28/2023 4.8    Chloride 05/28/2023 105    CO2 05/28/2023 24    Calcium 05/28/2023 10.5 (H)    Total Protein 05/28/2023 6.9    Albumin 05/28/2023 4.6    Globulin, Total 05/28/2023 2.3  Bilirubin Total 05/28/2023 0.2    Alkaline Phosphatase 05/28/2023 38 (L)    AST 05/28/2023 25    ALT 05/28/2023 20    LDL Direct 05/28/2023 68    Cholesterol, Total 05/28/2023 161    Triglycerides 05/28/2023 210 (H)    HDL 05/28/2023 48    VLDL Cholesterol Cal 05/28/2023 35    LDL Chol Calc (NIH) 05/28/2023 78    LDL CALC COMMENT: 05/28/2023 Comment    LDL/HDL Ratio 05/28/2023 1.6   Admission on 05/08/2023, Discharged on 05/08/2023  Component Date Value   WBC 05/08/2023 4.7    RBC 05/08/2023 3.89 (L)    Hemoglobin 05/08/2023 12.1 (L)    HCT 05/08/2023 35.7 (L)    MCV 05/08/2023 91.8    MCH 05/08/2023 31.1    MCHC 05/08/2023 33.9    RDW 05/08/2023 13.1    Platelets 05/08/2023 218    nRBC 05/08/2023 0.0    Neutrophils Relative % 05/08/2023 50    Neutro Abs 05/08/2023 2.4    Lymphocytes Relative 05/08/2023 34    Lymphs Abs 05/08/2023 1.6    Monocytes Relative 05/08/2023 8    Monocytes Absolute 05/08/2023 0.4    Eosinophils Relative 05/08/2023 6    Eosinophils Absolute 05/08/2023 0.3    Basophils Relative 05/08/2023 1    Basophils Absolute 05/08/2023 0.1    Immature Granulocytes 05/08/2023 1    Abs Immature Granulocytes 05/08/2023 0.03    Sodium 05/08/2023 140    Potassium 05/08/2023 4.5    Chloride 05/08/2023 108    CO2 05/08/2023 23    Glucose, Bld 05/08/2023 175 (H)    BUN 05/08/2023 30 (H)    Creatinine, Ser 05/08/2023 2.02 (H)    Calcium 05/08/2023 9.8    GFR, Estimated 05/08/2023 36 (L)    Anion gap 05/08/2023 9    Sed Rate 05/08/2023 8   Orders Only on 10/16/2022  Component Date Value   Cholesterol, Total 10/16/2022 136    Triglycerides 10/16/2022 195 (H)    HDL 10/16/2022 48    VLDL Cholesterol Cal 10/16/2022 32    LDL Chol Calc (NIH) 10/16/2022 56     Chol/HDL Ratio 10/16/2022 2.8    Glucose 10/16/2022 62 (L)    BUN 10/16/2022 20    Creatinine, Ser 10/16/2022 1.29 (H)    eGFR 10/16/2022 63    BUN/Creatinine Ratio 10/16/2022 16    Sodium 10/16/2022 146 (H)    Potassium 10/16/2022 4.6    Chloride 10/16/2022 109 (H)    CO2 10/16/2022 21    Calcium 10/16/2022 10.0    Total Protein 10/16/2022 7.1    Albumin 10/16/2022 4.8    Globulin, Total 10/16/2022 2.3    Albumin/Globulin Ratio 10/16/2022 2.1    Bilirubin Total 10/16/2022 <0.2    Alkaline Phosphatase 10/16/2022 42 (L)    AST 10/16/2022 32    ALT 10/16/2022 25    LDL Direct 10/16/2022 49    specimen status report 10/16/2022 Comment   Admission on 08/26/2022, Discharged on 08/26/2022  Component Date Value   Sodium 08/26/2022 138    Potassium 08/26/2022 4.1    Chloride 08/26/2022 106    CO2 08/26/2022 18 (L)    Glucose, Bld 08/26/2022 177 (H)    BUN 08/26/2022 26 (H)    Creatinine, Ser 08/26/2022 1.52 (H)    Calcium 08/26/2022 9.7    GFR, Estimated 08/26/2022 51 (L)    Anion gap 08/26/2022 14    WBC 08/26/2022 6.6    RBC 08/26/2022 4.04 (L)  Hemoglobin 08/26/2022 12.8 (L)    HCT 08/26/2022 37.4 (L)    MCV 08/26/2022 92.6    MCH 08/26/2022 31.7    MCHC 08/26/2022 34.2    RDW 08/26/2022 12.8    Platelets 08/26/2022 247    nRBC 08/26/2022 0.0    Neutrophils Relative % 08/26/2022 42    Neutro Abs 08/26/2022 2.8    Lymphocytes Relative 08/26/2022 44    Lymphs Abs 08/26/2022 2.9    Monocytes Relative 08/26/2022 8    Monocytes Absolute 08/26/2022 0.5    Eosinophils Relative 08/26/2022 5    Eosinophils Absolute 08/26/2022 0.3    Basophils Relative 08/26/2022 1    Basophils Absolute 08/26/2022 0.1    Immature Granulocytes 08/26/2022 0    Abs Immature Granulocytes 08/26/2022 0.02    Troponin I (High Sensiti* 08/26/2022 12    B Natriuretic Peptide 08/26/2022 32.3    Troponin I (High Sensiti* 08/26/2022 14    No image results found.   No results found.  No results  found.     Additional Info: This encounter employed real-time, collaborative documentation. The patient actively reviewed and updated their medical record on a shared screen, ensuring transparency and facilitating joint problem-solving for the problem list, overview, and plan. This approach promotes accurate, informed care. The treatment plan was discussed and reviewed in detail, including medication safety, potential side effects, and all patient questions. We confirmed understanding and comfort with the plan. Follow-up instructions were established, including contacting the office for any concerns, returning if symptoms worsen, persist, or new symptoms develop, and precautions for potential emergency department visits.

## 2023-07-22 NOTE — Assessment & Plan Note (Signed)
Extensive workup negative for bone marrow process, just severe B12 deficiency so that is likely the sole cause, expect resolution with B12 correction

## 2023-07-22 NOTE — Assessment & Plan Note (Addendum)
We spent most of visit reviewing this issue and management plan for it. Dietary reason could not be identified so this is assumed malabsorptive.  Will get gastrointestinal evaluation, intrinsic factor and gastrin, and Cologuard to evaluate gastrointestinal function related with B12. Cause of severe deficiency not easily identified. Alcohol use is only 2-3 cocktails 2-3x weekly  Today gave injection, arranged sl tablet. Follow up 1 month with repeat B12 to see if sublingual maintain   Confirmed by elevated methylmalonic acid, his condition likely contributes to anemia, fatigue, nerve tenderness, headaches, and memory loss, with an unclear etiology possibly related to diet or alcohol consumption. We administered a B12 injection today for an immediate boost and will start sublingual B12 supplementation. Labs are ordered in 30 days to check B12 and folate levels, intrinsic factor antibodies, and gastrin levels, with a GI referral considered depending on lab results.

## 2023-07-23 ENCOUNTER — Encounter: Payer: Self-pay | Admitting: Internal Medicine

## 2023-07-23 NOTE — Patient Instructions (Signed)
VISIT SUMMARY:  During your visit, we discussed your recent diagnosis of severe vitamin B12 deficiency, elevated calcium levels, history of shingles, need for colon cancer screening, improved kidney function, and rheumatoid arthritis. We also discussed your current medications and your willingness to administer B12 injections at home if necessary.  YOUR PLAN:  -SEVERE VITAMIN B12 DEFICIENCY: Your severe vitamin B12 deficiency, which can cause anemia, fatigue, nerve tenderness, headaches, and memory loss, was confirmed by elevated methylmalonic acid. We administered a B12 injection today and will start you on sublingual B12 supplementation. We will check your B12 and folate levels, intrinsic factor antibodies, and gastrin levels in 30 days.  -ELEVATED CALCIUM LEVELS: Your elevated calcium levels, possibly related to Tums use, prompted [place] to recommend a reduction in Tums use. We will repeat blood tests if your calcium level remains elevated.  I suspect this is what caused low parathyroid hormone levels prior, we will repeat the parathyroid hormone and reduce Tums and use Pepcid complete instead  -SHINGLES VACCINATION: Given your history of shingles three years ago, we administered the shingles vaccine today for additional protection.  -COLON CANCER SCREENING: We ordered a Cologuard test for colon cancer screening as there is no recent colonoscopy on record.  -KIDNEY FUNCTION: Your kidney function has recently improved, possibly related to rehydration. We will continue increased fluid intake and repeat kidney function tests in 30 days.  -RHEUMATOID ARTHRITIS: Your mild symptoms of rheumatoid arthritis, possibly exacerbated by B12 deficiency, will be monitored after B12 supplementation.  INSTRUCTIONS:  An appointment is scheduled 3 days after labs to discuss results and future plans. Please continue to take your medications as prescribed and administer B12 injections at home if necessary. Reduce  your use of Tums and continue to increase your fluid intake.

## 2023-07-24 NOTE — Progress Notes (Signed)
Reviewed at appointment, negative serum protein electrophoresis

## 2023-07-27 ENCOUNTER — Other Ambulatory Visit: Payer: Self-pay

## 2023-07-27 ENCOUNTER — Encounter: Payer: Self-pay | Admitting: Internal Medicine

## 2023-07-27 ENCOUNTER — Other Ambulatory Visit: Payer: Self-pay | Admitting: Internal Medicine

## 2023-07-27 DIAGNOSIS — D518 Other vitamin B12 deficiency anemias: Secondary | ICD-10-CM

## 2023-07-27 DIAGNOSIS — K760 Fatty (change of) liver, not elsewhere classified: Secondary | ICD-10-CM | POA: Insufficient documentation

## 2023-07-27 DIAGNOSIS — N400 Enlarged prostate without lower urinary tract symptoms: Secondary | ICD-10-CM | POA: Insufficient documentation

## 2023-07-27 DIAGNOSIS — E538 Deficiency of other specified B group vitamins: Secondary | ICD-10-CM

## 2023-07-27 HISTORY — DX: Deficiency of other specified B group vitamins: E53.8

## 2023-07-27 MED ORDER — B-12 1000-400 MCG SL SUBL: 1.0000 | SUBLINGUAL_TABLET | Freq: Every day | SUBLINGUAL | 3 refills | Status: DC

## 2023-07-27 NOTE — Progress Notes (Signed)
This message can be disregarded and closed if the patient has already reviewed their MyChart message about the kidney ultrasound results.  Key Points for Patient Follow-Up Call:  Kidney ultrasound shows normal kidneys, no masses or blockages. Slight changes in liver appearance and prostate size noted, require further evaluation.  Actions Required: Schedule visit with Dr. Jon Billings in 2-6 weeks if not already done.  Common Question Response: "The kidney ultrasound results are generally good, but we need to do some follow-up tests to check your liver and prostate.

## 2023-07-29 ENCOUNTER — Other Ambulatory Visit (INDEPENDENT_AMBULATORY_CARE_PROVIDER_SITE_OTHER): Payer: 59

## 2023-07-29 DIAGNOSIS — E785 Hyperlipidemia, unspecified: Secondary | ICD-10-CM

## 2023-07-29 DIAGNOSIS — I1A0 Resistant hypertension: Secondary | ICD-10-CM

## 2023-07-29 DIAGNOSIS — E1169 Type 2 diabetes mellitus with other specified complication: Secondary | ICD-10-CM | POA: Diagnosis not present

## 2023-07-29 DIAGNOSIS — N189 Chronic kidney disease, unspecified: Secondary | ICD-10-CM

## 2023-07-29 NOTE — Progress Notes (Signed)
If patient has already reviewed their MyChart message about recent test results, this task can be disregarded and closed.  If not, please cal or mail the patient to:  Inform that diabetes control has improved (A1c 6.2%). Mention kidney function test (eGFR) improvement but note protein in urine needing further evaluation. Ask patient to schedule video visit within 2-3 months to discuss results and plan. Remind to bring updated medication list to next appointment. Emphasize importance of continuing current diabetes management with Dr. Talmage Nap  If patient has questions beyond these points, advise to message Dr. Jon Billings through MyChart or call the office. After call or mail., document in chart your action

## 2023-07-30 NOTE — Telephone Encounter (Signed)
This prescription is not really needed. Patient has purchased a OTC b12 sublingual supplement to take. Also, none of the pharmacies on file have this medication.

## 2023-07-30 NOTE — Telephone Encounter (Signed)
Patient informed me that he bought a sublingual b12 supplement that is OTC (1000 units). Neither of his pharmacies on file have this in stock.

## 2023-08-04 ENCOUNTER — Other Ambulatory Visit: Payer: Self-pay

## 2023-08-04 NOTE — Progress Notes (Signed)
Called patient to inform him about his results. Patient has only been taking 2 weeks so when he has been taking it for 6 weeks he will go check his lipid, cmp and LDL

## 2023-08-17 ENCOUNTER — Other Ambulatory Visit (INDEPENDENT_AMBULATORY_CARE_PROVIDER_SITE_OTHER): Payer: 59

## 2023-08-17 DIAGNOSIS — N189 Chronic kidney disease, unspecified: Secondary | ICD-10-CM

## 2023-08-17 DIAGNOSIS — E1169 Type 2 diabetes mellitus with other specified complication: Secondary | ICD-10-CM

## 2023-08-17 DIAGNOSIS — R7989 Other specified abnormal findings of blood chemistry: Secondary | ICD-10-CM

## 2023-08-17 DIAGNOSIS — D518 Other vitamin B12 deficiency anemias: Secondary | ICD-10-CM

## 2023-08-17 DIAGNOSIS — I1A0 Resistant hypertension: Secondary | ICD-10-CM

## 2023-08-17 NOTE — Addendum Note (Signed)
Addended by: Donzetta Starch on: 08/17/2023 10:54 AM   Modules accepted: Orders

## 2023-08-18 ENCOUNTER — Encounter: Payer: Self-pay | Admitting: Neurology

## 2023-08-18 ENCOUNTER — Ambulatory Visit (INDEPENDENT_AMBULATORY_CARE_PROVIDER_SITE_OTHER): Payer: 59 | Admitting: Neurology

## 2023-08-18 VITALS — BP 113/61 | HR 80 | Ht 75.0 in | Wt 238.0 lb

## 2023-08-18 DIAGNOSIS — K219 Gastro-esophageal reflux disease without esophagitis: Secondary | ICD-10-CM

## 2023-08-18 DIAGNOSIS — R0683 Snoring: Secondary | ICD-10-CM

## 2023-08-18 DIAGNOSIS — E1149 Type 2 diabetes mellitus with other diabetic neurological complication: Secondary | ICD-10-CM | POA: Diagnosis not present

## 2023-08-18 DIAGNOSIS — I159 Secondary hypertension, unspecified: Secondary | ICD-10-CM | POA: Insufficient documentation

## 2023-08-18 DIAGNOSIS — E1169 Type 2 diabetes mellitus with other specified complication: Secondary | ICD-10-CM

## 2023-08-18 DIAGNOSIS — Z794 Long term (current) use of insulin: Secondary | ICD-10-CM

## 2023-08-18 DIAGNOSIS — E034 Atrophy of thyroid (acquired): Secondary | ICD-10-CM

## 2023-08-18 DIAGNOSIS — E785 Hyperlipidemia, unspecified: Secondary | ICD-10-CM

## 2023-08-18 LAB — CMP14+EGFR
ALT: 25 [IU]/L (ref 0–44)
AST: 31 [IU]/L (ref 0–40)
Albumin: 4.5 g/dL (ref 3.9–4.9)
Alkaline Phosphatase: 56 [IU]/L (ref 44–121)
BUN/Creatinine Ratio: 10 (ref 10–24)
BUN: 13 mg/dL (ref 8–27)
Bilirubin Total: 0.3 mg/dL (ref 0.0–1.2)
CO2: 22 mmol/L (ref 20–29)
Calcium: 9.6 mg/dL (ref 8.6–10.2)
Chloride: 109 mmol/L — ABNORMAL HIGH (ref 96–106)
Creatinine, Ser: 1.24 mg/dL (ref 0.76–1.27)
Globulin, Total: 2.7 g/dL (ref 1.5–4.5)
Glucose: 78 mg/dL (ref 70–99)
Potassium: 4.6 mmol/L (ref 3.5–5.2)
Sodium: 145 mmol/L — ABNORMAL HIGH (ref 134–144)
Total Protein: 7.2 g/dL (ref 6.0–8.5)
eGFR: 65 mL/min/{1.73_m2} (ref >59)

## 2023-08-18 LAB — LIPID PANEL WITH LDL/HDL RATIO
Cholesterol, Total: 164 mg/dL (ref 100–199)
HDL: 50 mg/dL (ref >39)
LDL Chol Calc (NIH): 80 mg/dL (ref 0–99)
LDL/HDL Ratio: 1.6 {ratio} (ref 0.0–3.6)
Triglycerides: 201 mg/dL — ABNORMAL HIGH (ref 0–149)
VLDL Cholesterol Cal: 34 mg/dL (ref 5–40)

## 2023-08-18 LAB — LIPID PANEL: EGFR: 65

## 2023-08-18 LAB — LDL CHOLESTEROL, DIRECT: LDL Direct: 73 mg/dL (ref 0–99)

## 2023-08-18 LAB — B12 AND FOLATE PANEL
Folate: 14.8 ng/mL (ref >3.0)
Vitamin B-12: 316 pg/mL (ref 232–1245)

## 2023-08-18 NOTE — Patient Instructions (Signed)
Sleep Apnea Sleep apnea affects breathing during sleep. It causes breathing to stop for 10 seconds or more, or to become shallow. People with sleep apnea usually snore loudly. It can also increase the risk of: Heart attack. Stroke. Being very overweight (obese). Diabetes. Heart failure. Irregular heartbeat. High blood pressure. The goal of treatment is to help you breathe normally again. What are the causes?  The most common cause of this condition is a collapsed or blocked airway. There are three kinds of sleep apnea: Obstructive sleep apnea. This is caused by a blocked or collapsed airway. Central sleep apnea. This happens when the brain does not send the right signals to the muscles that control breathing. Mixed sleep apnea. This is a combination of obstructive and central sleep apnea. What increases the risk? Being overweight. Smoking. Having a small airway. Being older. Being male. Drinking alcohol. Taking medicines to calm yourself (sedatives or tranquilizers). Having family members with the condition. Having a tongue or tonsils that are larger than normal. What are the signs or symptoms? Trouble staying asleep. Loud snoring. Headaches in the morning. Waking up gasping. Dry mouth or sore throat in the morning. Being sleepy or tired during the day. If you are sleepy or tired during the day, you may also: Not be able to focus your mind (concentrate). Forget things. Get angry a lot and have mood swings. Feel sad (depressed). Have changes in your personality. Have less interest in sex, if you are male. Be unable to have an erection, if you are male. How is this treated?  Sleeping on your side. Using a medicine to get rid of mucus in your nose (decongestant). Avoiding the use of alcohol, medicines to help you relax, or certain pain medicines (narcotics). Losing weight, if needed. Changing your diet. Quitting smoking. Using a machine to open your airway while you  sleep, such as: An oral appliance. This is a mouthpiece that shifts your lower jaw forward. A CPAP device. This device blows air through a mask when you breathe out (exhale). An EPAP device. This has valves that you put in each nostril. A BIPAP device. This device blows air through a mask when you breathe in (inhale) and breathe out. Having surgery if other treatments do not work. Follow these instructions at home: Lifestyle Make changes that your doctor recommends. Eat a healthy diet. Lose weight if needed. Avoid alcohol, medicines to help you relax, and some pain medicines. Do not smoke or use any products that contain nicotine or tobacco. If you need help quitting, ask your doctor. General instructions Take over-the-counter and prescription medicines only as told by your doctor. If you were given a machine to use while you sleep, use it only as told by your doctor. If you are having surgery, make sure to tell your doctor you have sleep apnea. You may need to bring your device with you. Keep all follow-up visits. Contact a doctor if: The machine that you were given to use during sleep bothers you or does not seem to be working. You do not get better. You get worse. Get help right away if: Your chest hurts. You have trouble breathing in enough air. You have an uncomfortable feeling in your back, arms, or stomach. You have trouble talking. One side of your body feels weak. A part of your face is hanging down. These symptoms may be an emergency. Get help right away. Call your local emergency services (911 in the [place] Do not wait to see if the  symptoms will go away. Do not drive yourself to the hospital. Summary This condition affects breathing during sleep. The most common cause is a collapsed or blocked airway. The goal of treatment is to help you breathe normally while you sleep. This information is not intended to replace advice given to you by your health care provider. Make  sure you discuss any questions you have with your health care provider. Document Revised: 06/12/2021 Document Reviewed: 10/12/2020 Elsevier Patient Education  2024 [employer] Screening for Sleep Apnea  Sleep apnea is a condition in which breathing pauses or becomes shallow during sleep. Sleep apnea screening is a test to determine if you are at risk for sleep apnea. The test includes a series of questions. It will only takes a few minutes. Your health care provider may ask you to have this test in preparation for surgery or as part of a physical exam. What are the symptoms of sleep apnea? Common symptoms of sleep apnea include: Snoring. Waking up often at night. Daytime sleepiness. Pauses in breathing. Choking or gasping during sleep. Irritability. Forgetfulness. Trouble thinking clearly. Depression. Personality changes. Most people with sleep apnea do not know that they have it. What are the advantages of sleep apnea screening? Getting screened for sleep apnea can help: Ensure your safety. It is important for your health care providers to know whether or not you have sleep apnea, especially if you are having surgery or have other long-term (chronic) health conditions. Improve your health and allow you to get a better night's rest. Restful sleep can help you: Have more energy. Lose weight. Improve high blood pressure. Improve diabetes management. Prevent stroke. Prevent car accidents. What happens during the screening? Screening usually includes being asked a list of questions about your sleep quality. Some questions you may be asked include: Do you snore? Is your sleep restless? Do you have daytime sleepiness? Has a partner or spouse told you that you stop breathing during sleep? Have you had trouble concentrating or memory loss? What is your age? What is your neck circumference? To measure your neck, keep your back straight and gently wrap the tape measure around your  neck. Put the tape measure at the middle of your neck, between your chin and collarbone. What is your sex assigned at birth? Do you have or are you being treated for high blood pressure? If your screening test is positive, you are at risk for the condition. Further testing may be needed to confirm a diagnosis of sleep apnea. Where to find more information You can find screening tools online or at your health care clinic. For more information about sleep apnea screening and healthy sleep, visit these websites: Centers for Disease Control and Prevention: [employer] American Sleep Apnea Association: www.sleepapnea.org Contact a health care provider if: You think that you may have sleep apnea. Summary Sleep apnea screening can help determine if you are at risk for sleep apnea. It is important for your health care providers to know whether or not you have sleep apnea, especially if you are having surgery or have other chronic health conditions. You may be asked to take a screening test for sleep apnea in preparation for surgery or as part of a physical exam. This information is not intended to replace advice given to you by your health care provider. Make sure you discuss any questions you have with your health care provider. Document Revised: 10/12/2020 Document Reviewed: 10/12/2020 Elsevier Patient Education  2024 [employer].

## 2023-08-18 NOTE — Progress Notes (Signed)
SLEEP MEDICINE CLINIC    Provider:  Melvyn Novas, MD  Primary Care Physician:  Lula Olszewski, MD [address] [place] [address]     Referring Provider: Lula Olszewski, Md [address] [place],  [place] [address]          Chief Complaint according to patient   Patient presents with:     New Patient (Initial Visit)           HISTORY OF PRESENT ILLNESS:  Frederick Robinson is a 62 y.o. male patient who is seen upon a cardiologist referral on 08/18/2023 to evaluate for sleep apnea,  he has hypothyroidism, DM , for many decades and recently his BP were high in AM. He has also headaches at night.   He is now on 3 antihypertensive meds and still struggles with high BP readings. Nephrologist started [place] and endocrinologist started ozempic .   Chief concern according to patient :  BP reading in AM higher.    I have the pleasure of seeing Frederick Robinson 08/18/23 a right-handed male with a possible sleep disorder, we are looking for a causes of HTN.     Sleep relevant medical history: DM, Thyroid disease, HTN, B 12 deficiency, anemia.  Nocturia 1 time-2 times, Tonsillectomy at age 31,  thyroid disease.     Family medical /sleep history: Thyroid maternal family, cancer had kidney cancer, father liver cancer.  Brother  ( MD , a year younger than the patient ) passed form bladder cancer. G Father on CPAP with OSA,  mother had CAD, 4 vessel- bypass. Strong paternal history of Parkinson's disease, bipolar disorder.   Social history:  He is busy as a father of a 38 year -old. 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   He used to travel a lot.    Tobacco use; quit at 24.   ETOH use ; moderate weekend 10 drinks- snores more after wards.GERD, snoring.  Caffeine intake in form of Coffee( 2-3 cups in AM ) Soda( /) Tea ( /) or energy drinks Exercise in form of walking.   Hobbies :golf.     Sleep habits are as follows: The patient's dinner time is between 6-7 PM.  The patient goes to bed at 10 PM and continues to sleep for 7-8 hours, wakes for one bathroom break. The preferred sleep position is laterally, with the support of 1-2 pillows.  Dreams are reportedly frequent.   The patient wakes up spontaneously at 7.50 with an alarm set for 8 AM . 8 AM is the usual rise time. He reports not feeling refreshed or restored in AM, with symptoms such as dry mouth, recently morning headaches, and residual fatigue. Naps are taken infrequently and are less refreshing than nocturnal sleep.    Review of Systems: Out of a complete 14 system review, the patient complains of only the following symptoms, and all other reviewed systems are negative.:  Fatigue, sleepiness , snoring,  HTN, HA  with dehydration.    How likely are you to doze in the following situations: 0 = not likely, 1 = slight chance, 2 = moderate chance, 3 = high chance   Sitting and Reading? Watching Television? Sitting inactive in a public place (theater or meeting)? As a passenger in a car for an hour without a break? Lying down in the afternoon when circumstances permit? Sitting and talking  to someone? Sitting quietly after lunch without alcohol? In a car, while stopped for a few minutes in traffic?   Total = 3/ 24 points   FSS endorsed at 33/ 63 points.   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    Highest education level: Bachelor's degree (e.g., BA, AB, BS)  Occupational History   Not on file  Tobacco Use   Smoking status: Former    Current packs/day: 0.00    Average packs/day: 2.0 packs/day for 25.0 years (50.0 ttl pk-yrs)    Types: Cigarettes, Cigars    Start date: 07/12/1984    Quit date: 07/12/2009    Years since quitting: 14.1   Smokeless tobacco: Never  Vaping Use    Vaping status: Never Used  Substance and Sexual Activity   Alcohol use: Yes    Alcohol/week: 16.0 standard drinks of alcohol    Types: 6 Glasses of wine, 10 Standard drinks or equivalent per week    Comment: weekends   Drug use: No   Sexual activity: Yes    Birth control/protection: None  Other Topics Concern   Not on file  Social History Narrative   Not on file   Social Determinants of Health   Financial Resource Strain: Low Risk  (07/18/2023)   Overall Financial Resource Strain (CARDIA)    Difficulty of Paying Living Expenses: Not very hard  Food Insecurity: No Food Insecurity (07/18/2023)   Hunger Vital Sign    Worried About Running Out of Food in the Last Year: Never true    Ran Out of Food in the Last Year: Never true  Transportation Needs: No Transportation Needs (07/18/2023)   PRAPARE - Administrator, Civil Service (Medical): No    Lack of Transportation (Non-Medical): No  Physical Activity: Insufficiently Active (07/18/2023)   Exercise Vital Sign    Days of Exercise per Week: 3 days    Minutes of Exercise per Session: 30 min  Stress: No Stress Concern Present (07/18/2023)   [employer] of Occupational Health - Occupational Stress Questionnaire    Feeling of Stress : Only a little  Social Connections: Moderately Integrated (07/18/2023)   Social Connection and Isolation Panel [NHANES]    Frequency of Communication with Friends and Family: Never    Frequency of Social Gatherings with Friends and Family: Never    Attends Religious Services: More than 4 times per year    Active Member of Clubs or Organizations: Yes    Attends Engineer, structural: More than 4 times per year    Marital Status: Married    Family History  Problem Relation Age of Onset   Cancer Mother    Kidney disease Mother    Heart disease Father    Heart attack Father    Cancer Father        liver cancer   Diabetes Father    Obesity Father    Cancer Brother 68     Past Medical History:  Diagnosis Date   Arthritis 01/30/2023   B12 deficiency 07/27/2023   Diabetes mellitus with neuropathy, complication (HCC) dx 2000    Hyperlipidemia    Hypertension in AM     Melanoma (HCC)    Thyroid disease     Past Surgical History:  Procedure Laterality Date   COLONOSCOPY N/A 11/07/2014   Procedure: COLONOSCOPY;  Surgeon: Dalia Heading, MD;  Location: [facility];  Service: Gastroenterology;  Laterality: N/A;  830   FRACTURE SURGERY  06/11/2023   KNEE SURGERY Right    REPAIR OF PERONEUS BREVIS TENDON Left 06/11/2023   Procedure: REPAIR OF PERONEUS BREVIS TENOLYSIS;  Surgeon: Toni Arthurs, MD;  Location: [facility];  Service: Orthopedics;  Laterality: Left;   TENDON REPAIR Left 06/11/2023   Procedure: LEFT PERONEUS LONGUS REPAIR;  Surgeon: Toni Arthurs, MD;  Location: [facility];  Service: Orthopedics;  Laterality: Left;     Current Outpatient Medications on File Prior to Visit  Medication Sig Dispense Refill   amLODipine (NORVASC) 10 MG tablet TAKE 1 TABLET (10 MG TOTAL) BY MOUTH DAILY AT 10 PM. 90 tablet 0   aspirin 81 MG tablet Take 81 mg by mouth daily.     B-D UF III MINI PEN NEEDLES 31G X 5 MM MISC USE AS DIRECTED 100 each 5   carvedilol (COREG) 12.5 MG tablet TAKE 1 TABLET (12.5MG  TOTAL) BY MOUTH TWICE A DAY WITH MEALS 180 tablet 1   Cobalamin Combinations (B-12) 1000-400 MCG SUBL Place 1 tablet under the tongue daily. 90 tablet 3   Continuous Glucose Sensor (DEXCOM G6 SENSOR) MISC USE AS DIRECTED. NEED TO REPLACE EVERY 10 DAYS. 9 each 3   Continuous Glucose Transmitter (DEXCOM G6 TRANSMITTER) MISC USE AS DIRECTED 1 each 0   dapagliflozin propanediol (FARXIGA) 10 MG TABS tablet Take by mouth daily.     diclofenac Sodium (VOLTAREN) 1 % GEL USE AS DIRECTED 50 g 2   Evolocumab (REPATHA SURECLICK) 140 MG/ML SOAJ INJECT 140 MG INTO THE SKIN EVERY 14 (FOURTEEN) DAYS. 6 mL 1   fenofibrate micronized (LOFIBRA) 200 MG  capsule daily before breakfast.     icosapent Ethyl (VASCEPA) 1 g capsule Take 2 capsules (2 g total) by mouth 2 (two) times daily. 180 capsule 32   isosorbide-hydrALAZINE (BIDIL) 20-37.5 MG tablet TAKE 1 TABLET BY MOUTH THREE TIMES A DAY 270 tablet 1   levothyroxine (SYNTHROID) 112 MCG tablet Take 224 mcg by mouth daily.     loratadine (CLARITIN) 10 MG tablet Take 10 mg by mouth daily.     losartan (COZAAR) 100 MG tablet Take 1 tablet (100 mg total) by mouth daily at 10 pm. 90 tablet 0   rosuvastatin (CRESTOR) 40 MG tablet Take 40 mg by mouth daily.     TRESIBA FLEXTOUCH 200 UNIT/ML FlexTouch Pen Inject 50 Units into the skin in the morning and at bedtime.     No current facility-administered medications on file prior to visit.    Allergies  Allergen Reactions   Pravastatin Sodium     REACTION: HIVES     DIAGNOSTIC DATA (LABS, IMAGING, TESTING) - I reviewed patient records, labs, notes, testing and imaging myself where available.  Lab Results  Component Value Date   WBC 4.9 07/06/2023   HGB 11.4 (L) 07/06/2023   HCT 34.7 (L) 07/06/2023   MCV 94.1 07/06/2023   PLT 209.0 07/06/2023      Component Value Date/Time   NA 145 (H) 08/17/2023 1156   K 4.6 08/17/2023 1156   CL 109 (H) 08/17/2023 1156   CO2 22 08/17/2023 1156   GLUCOSE 78 08/17/2023 1156   GLUCOSE 145 (H) 07/06/2023 1011   BUN 13 08/17/2023 1156   CREATININE 1.24 08/17/2023 1156   CALCIUM 9.6 08/17/2023 1156   PROT 7.2 08/17/2023 1156   ALBUMIN 4.5 08/17/2023 1156  AST 31 08/17/2023 1156   ALT 25 08/17/2023 1156   ALKPHOS 56 08/17/2023 1156   BILITOT 0.3 08/17/2023 1156   GFRNONAA 79 05/29/2023 0852   GFRAA 72 02/15/2018 1118   Lab Results  Component Value Date   CHOL 164 08/17/2023   HDL 50 08/17/2023   LDLCALC 80 08/17/2023   LDLDIRECT 73 08/17/2023   TRIG 201 (H) 08/17/2023   CHOLHDL 2.8 10/16/2022   Lab Results  Component Value Date   HGBA1C 6.6 02/15/2018   Lab Results  Component Value Date    VITAMINB12 316 08/17/2023   Lab Results  Component Value Date   TSH 1.690 02/15/2018    PHYSICAL EXAM:  Today's Vitals   08/18/23 1107  BP: 113/61  Pulse: 80  Weight: 238 lb (108 kg)  Height: 6\' 3"  (1.905 m)   Body mass index is 29.75 kg/m.   Wt Readings from Last 3 Encounters:  08/18/23 238 lb (108 kg)  06/22/23 240 lb (108.9 kg)  06/11/23 236 lb 12.4 oz (107.4 kg)     Ht Readings from Last 3 Encounters:  08/18/23 6\' 3"  (1.905 m)  07/22/23 6\' 3"  (1.905 m)  07/09/23 6\' 3"  (1.905 m)      General: The patient is awake, alert and appears not in acute distress. The patient is well groomed. Head: Normocephalic, atraumatic. Neck is supple. Mallampati 2,  neck circumference:18.5  inches. Nasal airflow congested =not fully  patent.  Retrognathia Lavone Nian is seen  seen.  Dental status:  some teeth removed, used to wear braces.  Cardiovascular:  Regular rate and cardiac rhythm by pulse,  without distended neck veins. Respiratory: Lungs are clear to auscultation.  Skin:  Without evidence of ankle edema,  had foot surgery-  Trunk: The patient's posture is erect.   NEUROLOGIC EXAM: The patient is awake and alert, oriented to place and time.   Memory subjective described as intact.  Attention span & concentration ability appears normal.  Speech is fluent,  without dysarthria, dysphonia or aphasia.  Mood and affect are appropriate.   Cranial nerves: no loss of smell or taste reported  Pupils are equal and briskly reactive to light. Funduscopic exam deferred..  Extraocular movements in vertical and horizontal planes were intact and without nystagmus. No Diplopia. Visual fields by finger perimetry are intact. Hearing was intact to soft voice and finger rubbing.    Facial sensation intact to fine touch.  Facial motor strength is symmetric and tongue and uvula move midline.  Neck ROM : rotation, tilt and flexion extension were normal for age and shoulder shrug was symmetrical.     Motor exam:  Symmetric bulk, tone and ROM.   Normal tone without cog -wheeling, symmetric grip strength .   Sensory:  Fine touch, and vibration were tested  and  normal.  Proprioception tested in the upper extremities was normal.   Coordination: Rapid alternating movements in the fingers/hands were of normal speed.    Gait and station: Patient could rise unassisted from a seated position, walked without assistive device.  Toe and heel walk were deferred.  Deep tendon reflexes: in the  upper and lower extremities are symmetric and intact.     ASSESSMENT AND PLAN 27 - year -old male patient here with:  new onset high morning BP.     1) rule out  possible contributing factor of OSA , HST ordered. Risk factors are  BMI, larger neck, m snoring witnessed. Morning HA, and nasal congestion.   2)  has co-morbidities of DM, thyroid disease,   3) no other sleep related concerns, good solid sleep.   HST ordered.    I plan to follow up either personally or through our NP within 3-4 months.   I would like to thank  Lula Olszewski, Md [address] [place],  [place] [address] for allowing me to meet with and to take care of this pleasant patient.   CC: I will share my notes with PCP and cardiologist Dr. Odis Hollingshead.  After spending a total time of  45  minutes face to face and additional time for physical and neurologic examination, review of laboratory studies,  personal review of imaging studies, reports and results of other testing and review of referral information / records as far as provided in visit,   Electronically signed by: Melvyn Novas, MD 08/18/2023 11:32 AM  [facility] Board certified by The [employer] of Sleep Medicine and Diplomate of the [employer] of Sleep Medicine. Board certified In Neurology through the ABPN, Fellow of the [employer] of Neurology.

## 2023-08-19 ENCOUNTER — Encounter: Payer: Self-pay | Admitting: Internal Medicine

## 2023-08-19 DIAGNOSIS — D510 Vitamin B12 deficiency anemia due to intrinsic factor deficiency: Secondary | ICD-10-CM | POA: Insufficient documentation

## 2023-08-19 NOTE — Progress Notes (Signed)
Reviewed labs from 08/17/2023. B12 levels normalized (316 pg/mL) after treatment. Kidney function stable (eGFR 65). Mild electrolyte imbalances noted. Lipids show persistent hypertriglyceridemia. Positive Intrinsic Factor antibody confirms pernicious anemia. Continue current treatments. Adjust medications as needed. Detailed explanation and plan in Patient Message.

## 2023-08-19 NOTE — Progress Notes (Signed)
Kidney function unchanged from 2016, and totally recovered from the worsening creatine in June.  Now GFR is at 65, stage 2 chronic kidney disease.  05/13/07 12:09 Creatinine: 1.0  09/16/07 10:45 Creatinine: 1.2  11/24/07 09:08 Creatinine: 1.4  08/14/08 09:08 Creatinine: 1.0  12/13/08 09:19 Creatinine: 1.1  10/29/09 10:29 Creatinine: 1.0  08/28/15 08:25 Creatinine: 1.18  08/31/15 17:08 Creatinine: 1.22  02/15/18 11:18 Creatinine: 1.27  03/20/22 09:29 Creatinine: 0.98  08/26/22 18:26 Creatinine: 1.52 (H)  10/16/22 11:24 Creatinine: 1.29 (H)  05/08/23 13:43 Creatinine: 2.02 (H)  05/28/23 10:30 Creatinine: 1.35 (H)  07/06/23 10:11 Creatinine: 1.22  08/17/23 11:56 Creatinine: 1.24   (H): Data is abnormally high

## 2023-08-20 ENCOUNTER — Encounter: Payer: Self-pay | Admitting: Internal Medicine

## 2023-08-20 ENCOUNTER — Ambulatory Visit: Payer: 59 | Admitting: Internal Medicine

## 2023-08-20 VITALS — BP 154/70 | HR 65 | Temp 97.7°F | Ht 75.0 in | Wt 242.2 lb

## 2023-08-20 DIAGNOSIS — S30860S Insect bite (nonvenomous) of lower back and pelvis, sequela: Secondary | ICD-10-CM | POA: Diagnosis not present

## 2023-08-20 DIAGNOSIS — W57XXXA Bitten or stung by nonvenomous insect and other nonvenomous arthropods, initial encounter: Secondary | ICD-10-CM

## 2023-08-20 DIAGNOSIS — N182 Chronic kidney disease, stage 2 (mild): Secondary | ICD-10-CM | POA: Diagnosis not present

## 2023-08-20 DIAGNOSIS — S30860A Insect bite (nonvenomous) of lower back and pelvis, initial encounter: Secondary | ICD-10-CM | POA: Diagnosis not present

## 2023-08-20 DIAGNOSIS — K760 Fatty (change of) liver, not elsewhere classified: Secondary | ICD-10-CM | POA: Diagnosis not present

## 2023-08-20 DIAGNOSIS — D510 Vitamin B12 deficiency anemia due to intrinsic factor deficiency: Secondary | ICD-10-CM | POA: Diagnosis not present

## 2023-08-20 DIAGNOSIS — W57XXXS Bitten or stung by nonvenomous insect and other nonvenomous arthropods, sequela: Secondary | ICD-10-CM

## 2023-08-20 DIAGNOSIS — I1A0 Resistant hypertension: Secondary | ICD-10-CM

## 2023-08-20 MED ORDER — KERENDIA 10 MG PO TABS
10.0000 mg | ORAL_TABLET | Freq: Every day | ORAL | 0 refills | Status: DC
Start: 2023-08-20 — End: 2023-10-13

## 2023-08-20 MED ORDER — B-12 COMPLIANCE INJECTION 1000 MCG/ML IJ KIT: 1.0000 mg | PACK | INTRAMUSCULAR | 11 refills | Status: DC

## 2023-08-20 NOTE — Telephone Encounter (Signed)
Patient was seen for an OV today, lab results were discussed.

## 2023-08-20 NOTE — Assessment & Plan Note (Signed)
He experiences elevated morning readings that normalize later in the day and is on [place] and [place]. We will monitor his blood pressure closely and make no changes to current medications at this time.

## 2023-08-20 NOTE — Assessment & Plan Note (Signed)
His B12 deficiency, likely triggered by a tick bite and secondary to intrinsic factor antibodies, has improved with sublingual supplementation. We will continue sublingual B12 supplementation and check B12 levels in 3 months.  He was considering putting off gastrointestinal evaluation and I agreed but later reviewed importance of getting an esophagoduodenoscopy at onset so sent message about this. Gastrointestinal referral is pending

## 2023-08-20 NOTE — Patient Instructions (Signed)
VISIT SUMMARY:  During your visit, we discussed your concerns about fluctuating blood pressure, kidney function, liver health, and the management of pernicious anemia. You are actively engaged in your care, adhering to prescribed medications, and open to adjustments in your treatment plan.  YOUR PLAN:  -PERNICIOUS ANEMIA: Your B12 deficiency, which is likely due to your body's inability to absorb B12, has improved with the use of under-the-tongue B12 supplements. We will continue this treatment and check your B12 levels in 3 months.  -CHRONIC KIDNEY DISEASE (STAGE 2): Your kidney function is stable, and we will continue your current medication, Marcelline Deist. We are also considering adding a new medication, Chauncey Mann, pending approval from your kidney specialist.  -FATTY LIVER: You have some fat in your liver, which was detected on an ultrasound. We will continue your fish oil supplements and low carb diet. We may also consider a test called an elastogram, which measures the stiffness of your liver, pending insurance approval.  -HYPERTENSION: Your blood pressure is higher in the morning but normalizes later in the day. We will continue to monitor your blood pressure closely and make no changes to your current medications at this time.  -GENERAL HEALTH MAINTENANCE: We will complete a Cologuard test for colon cancer screening. This is a non-invasive test that checks for signs of colon cancer in your stool.  INSTRUCTIONS:  Please schedule a follow-up appointment in 4-8 weeks. We will also need to do some lab tests before your next appointment. Continue taking your medications as prescribed and maintain your low carb diet.

## 2023-08-20 NOTE — Progress Notes (Signed)
[facility]: [phone]   -- Medical Office Visit --  Patient:  Frederick Robinson      Age: 62 y.o.       Sex:  male  Date:   08/20/2023 Patient Care Team: Lula Olszewski, MD as PCP - General (Internal Medicine) Dorisann Frames, MD as Referring Physician (Endocrinology) Malva Cogan, MD as Referring Physician (Dermatology) Today's Healthcare Provider: Lula Olszewski, MD    Assessment & Plan Tick bite of lower back, sequela Based on discussion today, it seems his health has changed in wake of tick bite - that might explain the pernicious anemia  Fatty liver Detected on ultrasound, he is already on fish oil supplementation and following a low carb diet. We will continue fish oil supplementation, maintain a low carb diet, and consider an elastogram pending insurance approval. Pernicious anemia His B12 deficiency, likely triggered by a tick bite and secondary to intrinsic factor antibodies, has improved with sublingual supplementation. We will continue sublingual B12 supplementation and check B12 levels in 3 months.  He was considering putting off gastrointestinal evaluation and I agreed but later reviewed importance of getting an esophagoduodenoscopy at onset so sent message about this. Gastrointestinal referral is pending Chronic kidney disease, stage 2 (mild) Chronic Kidney Disease (Stage 2) His condition is stable with a GFR of 65 and no current proteinuria. We will continue Farxiga as prescribed by the nephrologist and consider adding [facility] pending nephrologist approval. Resistant hypertension He experiences elevated morning readings that normalize later in the day and is on [place] and [place]. We will monitor his blood pressure closely and make no changes to current medications at this time.  General Health Maintenance We will complete Cologuard for colon cancer screening and schedule a follow-up appointment in 4-8 weeks with labs prior to the  appointment.      Diagnoses and all orders for this visit: Tick bite of lower back, sequela -     Rocky mtn spotted fvr abs pnl(IgG+IgM) -     Finerenone (KERENDIA) 10 MG TABS; Take 1 tablet (10 mg total) by mouth daily. -     US ABDOMEN COMPLETE W/ELASTOGRAPHY; Future -     Enhanced Liver Fibrosis (ELF) -     AntiMicrosomal Ab-Liver / Kidney -     Gamma GT -     Hemoglobin A1c -     Acute Hep Panel & Hep B Surface Ab -     Alpha-1-antitrypsin -     ANA -     Ferritin -     Mitochondrial antibodies -     Anti-smooth muscle antibody, IgG -     CBC with Differential/Platelet -     Cyanocobalamin (B-12 COMPLIANCE INJECTION) 1000 MCG/ML KIT; Inject 1 mg as directed once a week. Fatty liver -     Finerenone (KERENDIA) 10 MG TABS; Take 1 tablet (10 mg total) by mouth daily. -     US ABDOMEN COMPLETE W/ELASTOGRAPHY; Future -     Enhanced Liver Fibrosis (ELF) -     AntiMicrosomal Ab-Liver / Kidney -     Gamma GT -     Hemoglobin A1c -     Acute Hep Panel & Hep B Surface Ab -     Alpha-1-antitrypsin -     ANA -     Ferritin -     Mitochondrial antibodies -     Anti-smooth muscle antibody, IgG -     CBC with Differential/Platelet -  Cyanocobalamin (B-12 COMPLIANCE INJECTION) 1000 MCG/ML KIT; Inject 1 mg as directed once a week. Pernicious anemia -     Thyroid peroxidase antibody; Future -     Thyroglobulin antibody; Future -     B12 and Folate Panel; Future -     Methylmalonic Acid; Future -     TSH + free T4; Future -     H. pylori antigen, stool; Future -     Gastrin; Future -     Ferritin; Future  Recommended follow-up: 2 months  Future Appointments  Date Time Provider Department Center  10/06/2023  2:00 PM Jearl Klinefelter [facility]  10/13/2023  9:30 AM [facility]  10/21/2023 10:40 AM Lula Olszewski, MD [facility]  10/26/2023  1:00 PM Tessa Lerner, DO [facility]  12/10/2023  2:00 PM Lula Olszewski, MD [facility]             Subjective   62 y.o. male who has Hypothyroidism; Diabetes mellitus, type 2 (HCC); Hyperlipidemia associated with type 2 diabetes mellitus (HCC); Resistant hypertension; Allergic rhinitis; Former smoker; Leg injury; Family history of prostate cancer; Apathetic; CKD (chronic kidney disease); Peroneal tendon rupture, left, sequela; Pain of joint of left ankle and foot; Acquired cavovarus deformity of left foot; History of melanoma; Tear of medial meniscus of knee; Tendon rupture, post-op; Snoring; Memory loss; Macrocytic anemia; Vitamin D deficiency; Low serum parathyroid hormone (PTH); Foot lesion; B12 deficiency anemia; Hepatic steatosis; BPH (benign prostatic hyperplasia); Secondary hypertension; DM (diabetes mellitus), type 2 with neurological complications (HCC); GERD without esophagitis; and Pernicious anemia on their problem list. His reasons/main concerns/chief complaints for today's office visit are 1 month follow-up   ------------------------------------------------------------------------------------------------------------------------ AI-Extracted: Discussed the use of AI scribe software for clinical note transcription with the patient, who gave verbal consent to proceed.  History of Present Illness   The patient, a 62 year old with a history of type 2 diabetes, pernicious anemia, and recent tick bite, presents with concerns about fluctuating blood pressure, kidney function, and liver health. The patient reports persistent morning hypertension, with readings around 160/70, which normalizes by evening. However, recent in-office readings have been within normal limits, leading to confusion about the accuracy of home measurements.  The patient has been managing pernicious anemia with sublingual B12 supplements, noting an improvement in B12 levels. However, there is ongoing discussion about the potential need for B12 injections. The patient also reports taking iron supplements due to  suspected iron deficiency.  Regarding kidney health, the patient has been prescribed Farxiga by a nephrologist. The patient's kidney function has shown improvement over the past few months, moving from near stage 4 chronic kidney disease (CKD) to stage 2 CKD. The patient expresses concern about the potential impact of a recent tick bite on kidney function.  The patient also reports concerns about liver health, following an ultrasound that revealed fatty liver. The patient has started taking prescription fish oil supplements as part of the management plan. The patient's father had type 2 diabetes and liver issues, which adds to the patient's anxiety about liver health.  The patient also mentions a recent ankle surgery and the subsequent increased physical strain from caring for a toddler. The patient reports experiencing back pain, which he attributes to the increased lifting. The patient denies any swelling in the legs or other symptoms of edema.  In summary, the patient presents with concerns about fluctuating blood pressure, kidney function, liver health, and the management of pernicious anemia. The patient  is actively engaged in his care, adhering to prescribed medications, and is open to adjustments in his treatment plan.      He has a past medical history of Arthritis (01/30/2023), B12 deficiency (07/27/2023), Diabetes mellitus without complication (HCC), Hyperlipidemia, Hypertension, Melanoma (HCC), and Thyroid disease.  Problem list overviews that were updated at today's visit: No problems updated. Current Outpatient Medications on File Prior to Visit  Medication Sig   amLODipine (NORVASC) 10 MG tablet TAKE 1 TABLET (10 MG TOTAL) BY MOUTH DAILY AT 10 PM.   aspirin 81 MG tablet Take 81 mg by mouth daily.   B-D UF III MINI PEN NEEDLES 31G X 5 MM MISC USE AS DIRECTED   carvedilol (COREG) 12.5 MG tablet TAKE 1 TABLET (12.5MG  TOTAL) BY MOUTH TWICE A DAY WITH MEALS   Cobalamin Combinations  (B-12) 1000-400 MCG SUBL Place 1 tablet under the tongue daily.   Continuous Glucose Sensor (DEXCOM G6 SENSOR) MISC USE AS DIRECTED. NEED TO REPLACE EVERY 10 DAYS.   Continuous Glucose Transmitter (DEXCOM G6 TRANSMITTER) MISC USE AS DIRECTED   dapagliflozin propanediol (FARXIGA) 10 MG TABS tablet Take by mouth daily.   diclofenac Sodium (VOLTAREN) 1 % GEL USE AS DIRECTED   Evolocumab (REPATHA SURECLICK) 140 MG/ML SOAJ INJECT 140 MG INTO THE SKIN EVERY 14 (FOURTEEN) DAYS.   fenofibrate micronized (LOFIBRA) 200 MG capsule daily before breakfast.   icosapent Ethyl (VASCEPA) 1 g capsule Take 2 capsules (2 g total) by mouth 2 (two) times daily.   isosorbide-hydrALAZINE (BIDIL) 20-37.5 MG tablet TAKE 1 TABLET BY MOUTH THREE TIMES A DAY   levothyroxine (SYNTHROID) 112 MCG tablet Take 224 mcg by mouth daily.   loratadine (CLARITIN) 10 MG tablet Take 10 mg by mouth daily.   losartan (COZAAR) 100 MG tablet Take 1 tablet (100 mg total) by mouth daily at 10 pm.   rosuvastatin (CRESTOR) 40 MG tablet Take 40 mg by mouth daily.   TRESIBA FLEXTOUCH 200 UNIT/ML FlexTouch Pen Inject 50 Units into the skin in the morning and at bedtime.   No current facility-administered medications on file prior to visit.  There are no discontinued medications.   Objective   Physical Exam  BP (!) 154/70 (BP Location: Left Arm, Patient Position: Sitting)   Pulse 65   Temp 97.7 F (36.5 C) (Temporal)   Ht 6\' 3"  (1.905 m)   Wt 242 lb 3.2 oz (109.9 kg)   SpO2 98%   BMI 30.27 kg/m  Wt Readings from Last 10 Encounters:  08/20/23 242 lb 3.2 oz (109.9 kg)  08/18/23 238 lb (108 kg)  06/22/23 240 lb (108.9 kg)  06/11/23 236 lb 12.4 oz (107.4 kg)  05/08/23 235 lb (106.6 kg)  04/07/23 243 lb 6.4 oz (110.4 kg)  10/20/22 256 lb 12.8 oz (116.5 kg)  08/26/22 250 lb (113.4 kg)  04/01/22 246 lb (111.6 kg)  03/03/22 240 lb 12.8 oz (109.2 kg)   Vital signs reviewed.  Nursing notes reviewed. Weight trend reviewed. Abnormalities  and Problem-Specific physical exam findings:  mild truncal adiposity , some low back pain right lumbar  General Appearance:  No acute distress appreciable.   Well-groomed, healthy-appearing male.  Well proportioned with no abnormal fat distribution.  Good muscle tone. Pulmonary:  Normal work of breathing at rest, no respiratory distress apparent. SpO2: 98 %  Musculoskeletal: All extremities are intact.  Neurological:  Awake, alert, oriented, and engaged.  No obvious focal neurological deficits or cognitive impairments.  Sensorium seems unclouded.   Speech  is clear and coherent with logical content. Psychiatric:  Appropriate mood, pleasant and cooperative demeanor, thoughtful and engaged during the exam  Results   LABS B12: 400 (08/17/2023) Sodium: 145 (08/19/2023) GFR: 65 (08/19/2023)  RADIOLOGY Ultrasound renal: Normal kidney size, normal echogenicity (06/30/2023)  PATHOLOGY Peripheral smear: Borderline microcytic and hyperchromic suggesting iron deficiency, platelet components, no malignant cells (08/09/2023)        No results found for any visits on 08/20/23.  Lab on 08/17/2023  Component Date Value   PTH 08/17/2023 22    Calcium 08/17/2023 9.6    Intrinsic Factor 08/17/2023 Positive (A)    LDL Direct 08/17/2023 73    Glucose 08/17/2023 78    BUN 08/17/2023 13    Creatinine, Ser 08/17/2023 1.24    eGFR 08/17/2023 65    BUN/Creatinine Ratio 08/17/2023 10    Sodium 08/17/2023 145 (H)    Potassium 08/17/2023 4.6    Chloride 08/17/2023 109 (H)    CO2 08/17/2023 22    Calcium 08/17/2023 9.6    Total Protein 08/17/2023 7.2    Albumin 08/17/2023 4.5    Globulin, Total 08/17/2023 2.7    Bilirubin Total 08/17/2023 0.3    Alkaline Phosphatase 08/17/2023 56    AST 08/17/2023 31    ALT 08/17/2023 25    Cholesterol, Total 08/17/2023 164    Triglycerides 08/17/2023 201 (H)    HDL 08/17/2023 50    VLDL Cholesterol Cal 08/17/2023 34    LDL Chol Calc (NIH) 08/17/2023 80    LDL  CALC COMMENT: 08/17/2023 Comment    LDL/HDL Ratio 08/17/2023 1.6    Vitamin B-12 08/17/2023 316    Folate 08/17/2023 14.8   Orders Only on 07/29/2023  Component Date Value   EGFR 08/18/2023 65.0   Scanned Document on 07/27/2023  Component Date Value   A1c 05/29/2023 6.2    EGFR (Non-African Amer.) 05/29/2023 79    Creatinine, POC 05/29/2023 160.9    Albumin, Urine POC 05/29/2023 90.8    Albumin/Creatinine Ratio* 05/29/2023 56H   Scanned Document on 07/13/2023  Component Date Value   [facility] Hepatitis Screen 07/07/2023 Negative-Validated   Office Visit on 07/09/2023  Component Date Value   Path Review 07/09/2023     Vitamin B-12 07/09/2023 52 (L)    Folate 07/09/2023 >24.2    Methylmalonic Acid, Quant 07/09/2023 1,228 (H)   Lab on 07/06/2023  Component Date Value   VITD 07/06/2023 14.89 (L)    Color, Urine 07/06/2023 YELLOW    APPearance 07/06/2023 CLEAR    Specific Gravity, Urine 07/06/2023 1.015    pH 07/06/2023 7.0    Total Protein, Urine 07/06/2023 NEGATIVE    Urine Glucose 07/06/2023 NEGATIVE    Ketones, ur 07/06/2023 NEGATIVE    Bilirubin Urine 07/06/2023 NEGATIVE    Hgb urine dipstick 07/06/2023 NEGATIVE    Urobilinogen, UA 07/06/2023 1.0    Leukocytes,Ua 07/06/2023 NEGATIVE    Nitrite 07/06/2023 NEGATIVE    WBC, UA 07/06/2023 0-2/hpf    RBC / HPF 07/06/2023 none seen    PTH 07/06/2023 15 (L)    PSA 07/06/2023 0.54    Creatinine, Urine 07/06/2023 CANCELED    Phosphorus 07/06/2023 3.5    Microalb, Ur 07/06/2023 <0.7    Creatinine,U 07/06/2023 80.6    Microalb Creat Ratio 07/06/2023 0.9    Iron 07/06/2023 75    TIBC 07/06/2023 377    %SAT 07/06/2023 20    Ferritin 07/06/2023 44    HCV Quant Baseline 07/06/2023 HCV Not Detected  HCV log10 07/06/2023 CANCELED    Test Information 07/06/2023 Comment    HCV Genotype 07/06/2023 CANCELED    WBC 07/06/2023 4.9    RBC 07/06/2023 3.68 (L)    Hemoglobin 07/06/2023 11.4 (L)    HCT 07/06/2023 34.7 (L)    MCV 07/06/2023  94.1    MCHC 07/06/2023 33.0    RDW 07/06/2023 13.6    Platelets 07/06/2023 209.0    Neutrophils Relative % 07/06/2023 57.3    Lymphocytes Relative 07/06/2023 28.4    Monocytes Relative 07/06/2023 6.8    Eosinophils Relative 07/06/2023 6.6 (H)    Basophils Relative 07/06/2023 0.9    Neutro Abs 07/06/2023 2.8    Lymphs Abs 07/06/2023 1.4    Monocytes Absolute 07/06/2023 0.3    Eosinophils Absolute 07/06/2023 0.3    Basophils Absolute 07/06/2023 0.0    Sodium 07/06/2023 140    Potassium 07/06/2023 4.5    Chloride 07/06/2023 109    CO2 07/06/2023 22    Glucose, Bld 07/06/2023 145 (H)    BUN 07/06/2023 25 (H)    Creatinine, Ser 07/06/2023 1.22    GFR 07/06/2023 63.18    Calcium 07/06/2023 9.4   Admission on 06/11/2023, Discharged on 06/11/2023  Component Date Value   Glucose-Capillary 06/11/2023 110 (H)    Glucose-Capillary 06/11/2023 101 (H)   Orders Only on 05/26/2023  Component Date Value   Glucose 05/28/2023 77    BUN 05/28/2023 28 (H)    Creatinine, Ser 05/28/2023 1.35 (H)    eGFR 05/28/2023 59 (L)    BUN/Creatinine Ratio 05/28/2023 21    Sodium 05/28/2023 144    Potassium 05/28/2023 4.8    Chloride 05/28/2023 105    CO2 05/28/2023 24    Calcium 05/28/2023 10.5 (H)    Total Protein 05/28/2023 6.9    Albumin 05/28/2023 4.6    Globulin, Total 05/28/2023 2.3    Bilirubin Total 05/28/2023 0.2    Alkaline Phosphatase 05/28/2023 38 (L)    AST 05/28/2023 25    ALT 05/28/2023 20    LDL Direct 05/28/2023 68    Cholesterol, Total 05/28/2023 161    Triglycerides 05/28/2023 210 (H)    HDL 05/28/2023 48    VLDL Cholesterol Cal 05/28/2023 35    LDL Chol Calc (NIH) 05/28/2023 78    LDL CALC COMMENT: 05/28/2023 Comment    LDL/HDL Ratio 05/28/2023 1.6   Admission on 05/08/2023, Discharged on 05/08/2023  Component Date Value   WBC 05/08/2023 4.7    RBC 05/08/2023 3.89 (L)    Hemoglobin 05/08/2023 12.1 (L)    HCT 05/08/2023 35.7 (L)    MCV 05/08/2023 91.8    MCH 05/08/2023  31.1    MCHC 05/08/2023 33.9    RDW 05/08/2023 13.1    Platelets 05/08/2023 218    nRBC 05/08/2023 0.0    Neutrophils Relative % 05/08/2023 50    Neutro Abs 05/08/2023 2.4    Lymphocytes Relative 05/08/2023 34    Lymphs Abs 05/08/2023 1.6    Monocytes Relative 05/08/2023 8    Monocytes Absolute 05/08/2023 0.4    Eosinophils Relative 05/08/2023 6    Eosinophils Absolute 05/08/2023 0.3    Basophils Relative 05/08/2023 1    Basophils Absolute 05/08/2023 0.1    Immature Granulocytes 05/08/2023 1    Abs Immature Granulocytes 05/08/2023 0.03    Sodium 05/08/2023 140    Potassium 05/08/2023 4.5    Chloride 05/08/2023 108    CO2 05/08/2023 23    Glucose, Bld 05/08/2023 175 (H)  BUN 05/08/2023 30 (H)    Creatinine, Ser 05/08/2023 2.02 (H)    Calcium 05/08/2023 9.8    GFR, Estimated 05/08/2023 36 (L)    Anion gap 05/08/2023 9    Sed Rate 05/08/2023 8   Orders Only on 10/16/2022  Component Date Value   Cholesterol, Total 10/16/2022 136    Triglycerides 10/16/2022 195 (H)    HDL 10/16/2022 48    VLDL Cholesterol Cal 10/16/2022 32    LDL Chol Calc (NIH) 10/16/2022 56    Chol/HDL Ratio 10/16/2022 2.8    Glucose 10/16/2022 62 (L)    BUN 10/16/2022 20    Creatinine, Ser 10/16/2022 1.29 (H)    eGFR 10/16/2022 63    BUN/Creatinine Ratio 10/16/2022 16    Sodium 10/16/2022 146 (H)    Potassium 10/16/2022 4.6    Chloride 10/16/2022 109 (H)    CO2 10/16/2022 21    Calcium 10/16/2022 10.0    Total Protein 10/16/2022 7.1    Albumin 10/16/2022 4.8    Globulin, Total 10/16/2022 2.3    Albumin/Globulin Ratio 10/16/2022 2.1    Bilirubin Total 10/16/2022 <0.2    Alkaline Phosphatase 10/16/2022 42 (L)    AST 10/16/2022 32    ALT 10/16/2022 25    LDL Direct 10/16/2022 49    specimen status report 10/16/2022 Comment   There may be more visits with results that are not included.   No image results found.   US RENAL  Result Date: 07/24/2023 CLINICAL DATA:  Chronic kidney disease EXAM:  RENAL / URINARY TRACT ULTRASOUND COMPLETE COMPARISON:  None Available. Images made available for interpretation to me at 07/24/2023 at 4:42 p.m. FINDINGS: Right Kidney: Renal measurements: 12.4 x 6.2 x 6.3 cm = volume: 254 mL. Echogenicity within normal limits. No mass or hydronephrosis visualized. Left Kidney: Renal measurements: 12.4 x 7 x 6.9 cm = volume: 309 mL. Echogenicity within normal limits. No mass or hydronephrosis visualized. Bladder: Appears normal for degree of bladder distention. Other: Liver appears slightly echogenic.  Prostate is slightly enlarged. IMPRESSION: 1. Negative for hydronephrosis. Kidneys appear within normal limits. 2. Slightly echogenic liver suggesting hepatic steatosis and or hepatocellular disease. 3. Slightly enlarged prostate. Electronically Signed   By: Jasmine Pang M.D.   On: 07/24/2023 16:44    No results found.     Additional Info: This encounter employed real-time, collaborative documentation. The patient actively reviewed and updated their medical record on a shared screen, ensuring transparency and facilitating joint problem-solving for the problem list, overview, and plan. This approach promotes accurate, informed care. The treatment plan was discussed and reviewed in detail, including medication safety, potential side effects, and all patient questions. We confirmed understanding and comfort with the plan. Follow-up instructions were established, including contacting the office for any concerns, returning if symptoms worsen, persist, or new symptoms develop, and precautions for potential emergency department visits.

## 2023-08-22 LAB — PTH, INTACT AND CALCIUM
Calcium: 9.6 mg/dL (ref 8.6–10.3)
PTH: 22 pg/mL (ref 16–77)

## 2023-08-22 LAB — INTRINSIC FACTOR ANTIBODIES: Intrinsic Factor: POSITIVE — AB

## 2023-08-22 LAB — GASTRIN: Gastrin: 25 pg/mL (ref <=100)

## 2023-08-24 NOTE — Progress Notes (Signed)
Pth and calcium have returned to normal which is good news.

## 2023-09-02 ENCOUNTER — Ambulatory Visit: Payer: 59 | Admitting: Neurology

## 2023-09-02 DIAGNOSIS — I159 Secondary hypertension, unspecified: Secondary | ICD-10-CM

## 2023-09-02 DIAGNOSIS — R0683 Snoring: Secondary | ICD-10-CM

## 2023-09-02 DIAGNOSIS — E1149 Type 2 diabetes mellitus with other diabetic neurological complication: Secondary | ICD-10-CM

## 2023-09-02 DIAGNOSIS — K219 Gastro-esophageal reflux disease without esophagitis: Secondary | ICD-10-CM

## 2023-09-04 ENCOUNTER — Ambulatory Visit
Admission: RE | Admit: 2023-09-04 | Discharge: 2023-09-04 | Disposition: A | Discharge status: Home / Self Care | Payer: 59 | Source: Ambulatory Visit | Attending: Internal Medicine | Admitting: Internal Medicine

## 2023-09-04 DIAGNOSIS — K760 Fatty (change of) liver, not elsewhere classified: Secondary | ICD-10-CM

## 2023-09-04 DIAGNOSIS — W57XXXS Bitten or stung by nonvenomous insect and other nonvenomous arthropods, sequela: Secondary | ICD-10-CM

## 2023-09-07 IMAGING — CT CT CARDIAC CORONARY ARTERY CALCIUM SCORE
3 series · 14 of 20 positions shown, 16 images · non-contrast
Comparison: None.

CLINICAL DATA: 60-year-old Caucasian male with history of
hyperlipidemia. Coronary atherosclerosis screening.

EXAM:
CT CARDIAC CORONARY ARTERY CALCIUM SCORE
TECHNIQUE: Non-contrast imaging through the heart was performed using
prospective ECG gating. Image post processing was performed on an
independent workstation, allowing for quantitative analysis of the
heart and coronary arteries. Note that this exam targets the heart
and the chest was not imaged in its entirety.

[Series 2: calcium scoring 2.00 qr36 bestdiast 71% hrt calciu · axial · 0.45mm/px · z∈[+1588,+1684]mm · 4 of 80 slices shown]
[im 16/80  vessel]
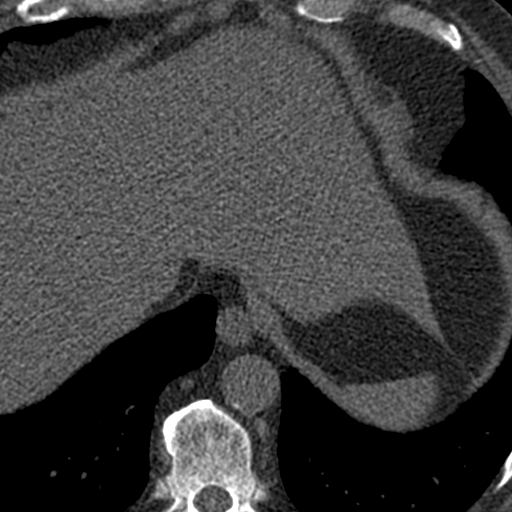
[im 32/80  vessel]
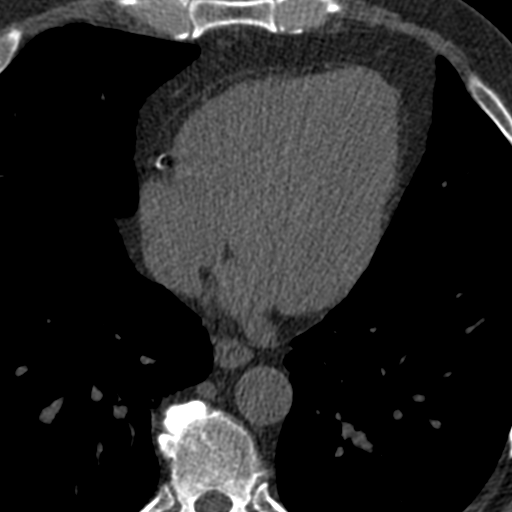
[im 48/80  vessel]
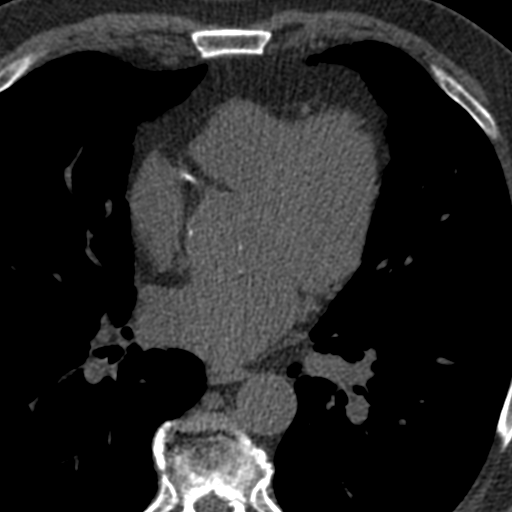
[im 64/80  vessel]
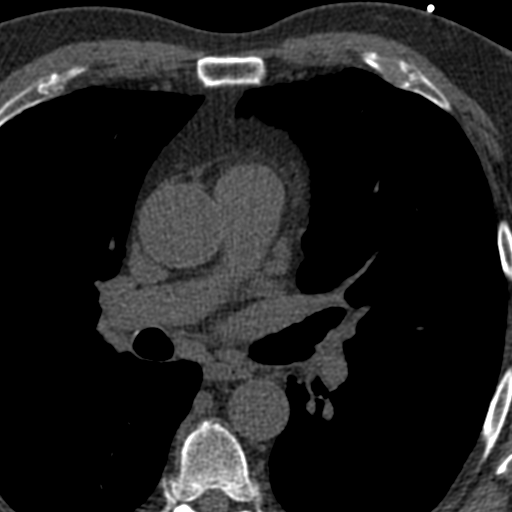

[Series 3: calcium scoring 2.00 br40 bestdiast 71% axial · axial · 0.70mm/px · z∈[+1584,+1688]mm · 5 of 80 slices shown, 7 images]
[im 14/80  vessel]
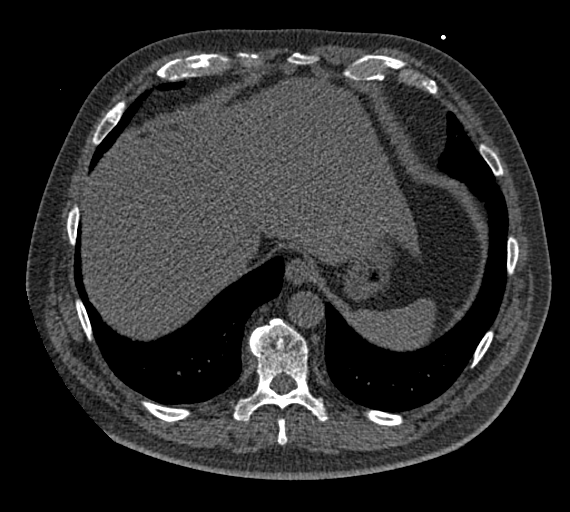
[im 14/80  lung]
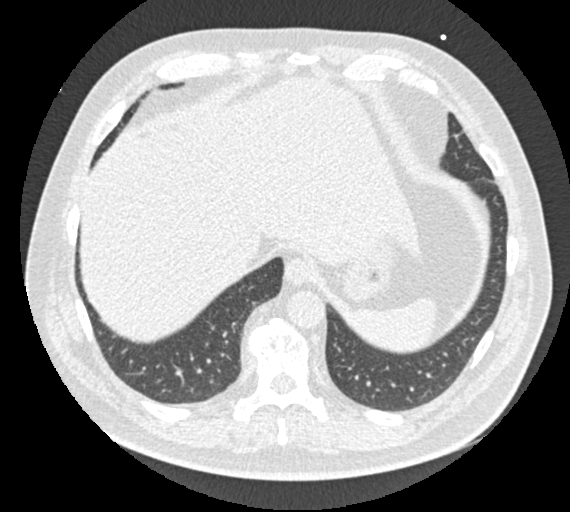
[im 27/80  vessel]
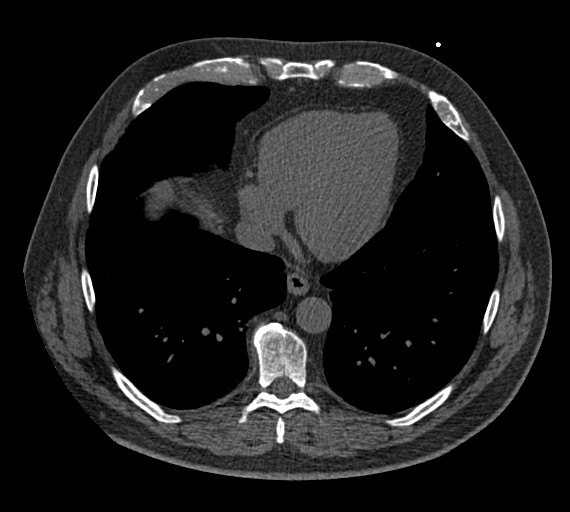
[im 40/80  vessel]
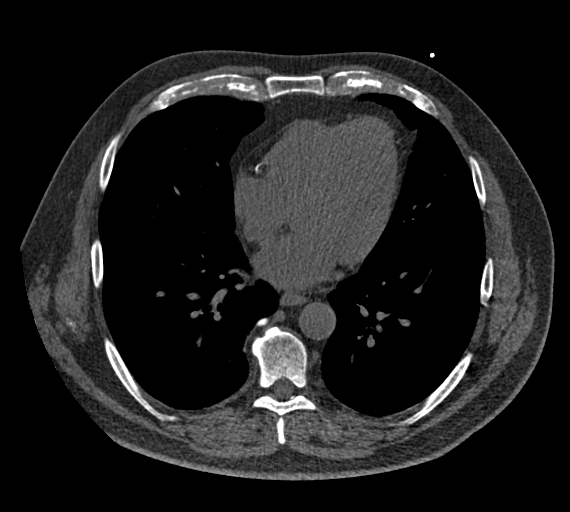
[im 53/80  vessel]
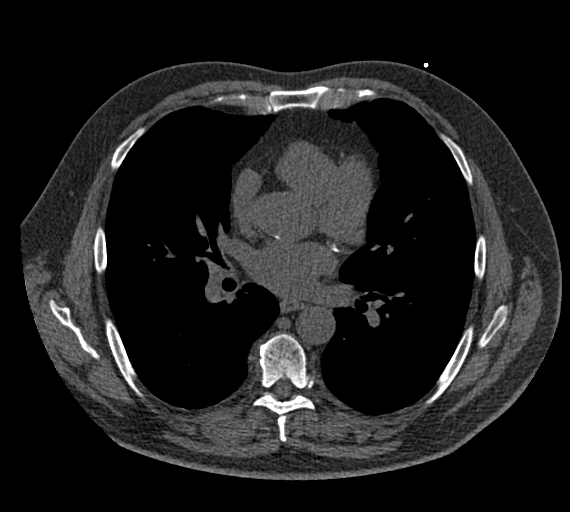
[im 66/80  vessel]
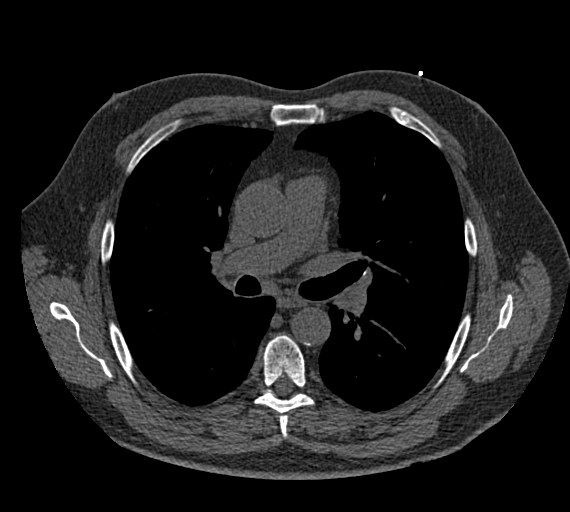
[im 66/80  lung]
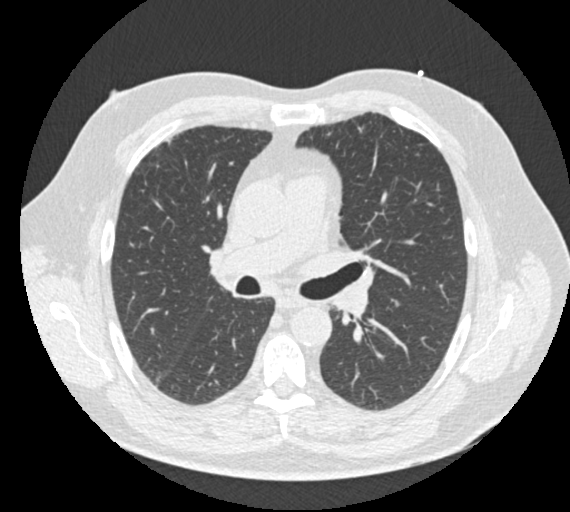

[Series 9: calcium scoring 2.00 br60 bestdiast 71% lungs · axial · 0.70mm/px · z∈[+1584,+1688]mm · 5 of 80 slices shown]
[im 14/80  vessel]
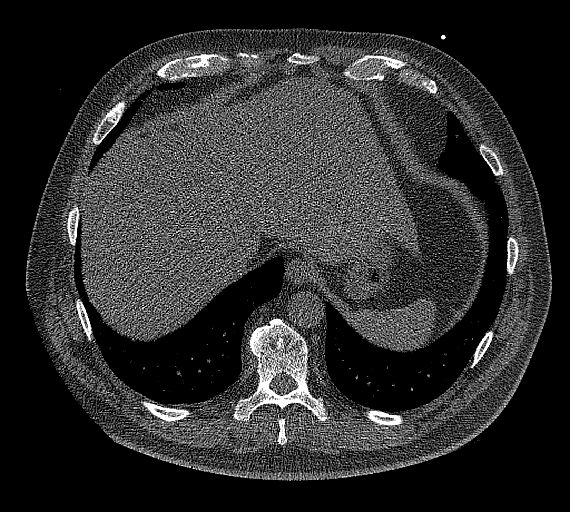
[im 27/80  vessel]
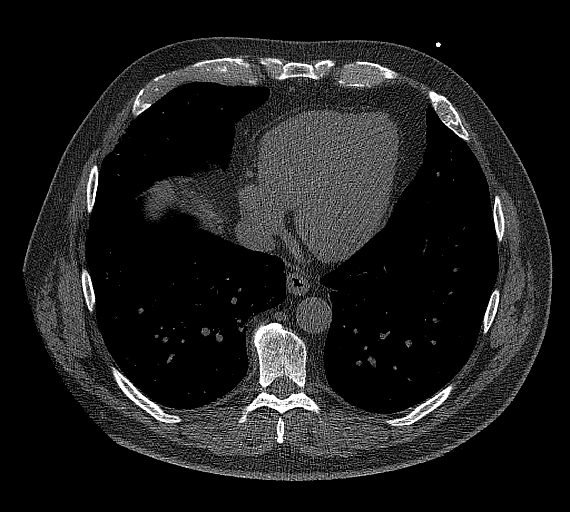
[im 40/80  vessel]
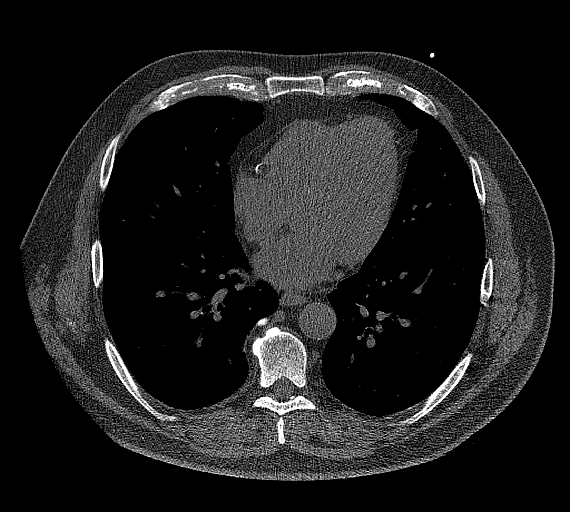
[im 53/80  vessel]
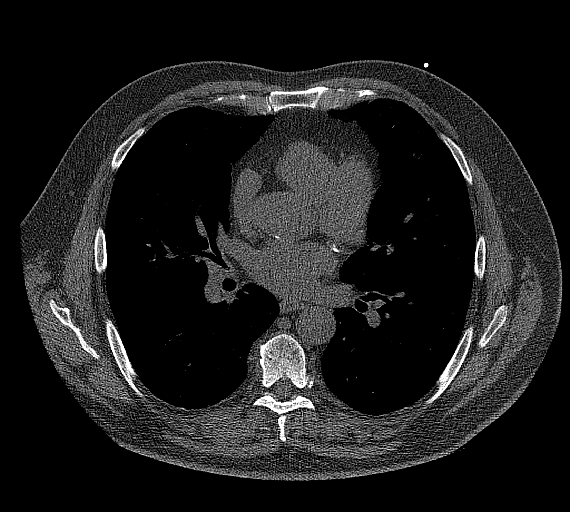
[im 66/80  vessel]
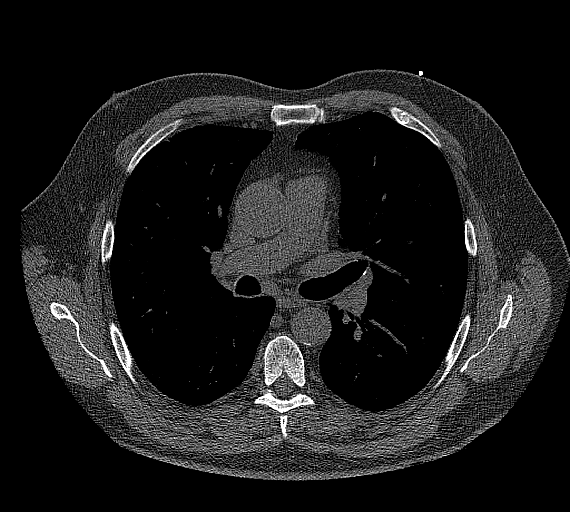

[14 of 20 positions shown; findings below may reference images not displayed]

FINDINGS: CORONARY CALCIUM SCORES:

Left Main:

LAD: 239

LCx:

RCA: 292

Total Agatston Score: 628

[HOSPITAL] percentile: 92nd

AORTA MEASUREMENTS:

Ascending Aorta: 38 mm

Descending Aorta: 28 mm

OTHER FINDINGS:

Cardiovascular: The heart is normal in size.

Mediastinum/Nodes: Visualized esophagus and trachea within normal
limits. No mediastinal lymphadenopathy.

Lungs/Pleura: The lungs are clear bilaterally.

Upper Abdomen: The visualized upper abdomen is within normal limits.

Musculoskeletal: No acute osseous abnormality.
IMPRESSION: Three-vessel coronary atherosclerotic calcifications. The observed
calcium score of 628 is at the 92nd percentile for subjects of the
same age, gender, and race/ethnicity who are free of clinical
cardiovascular disease and treated diabetes.

## 2023-09-09 ENCOUNTER — Ambulatory Visit
Admission: RE | Admit: 2023-09-09 | Discharge: 2023-09-09 | Disposition: A | Discharge status: Home / Self Care | Payer: 59 | Source: Ambulatory Visit | Attending: Family Medicine | Admitting: Family Medicine

## 2023-09-09 ENCOUNTER — Ambulatory Visit: Payer: 59

## 2023-09-09 ENCOUNTER — Telehealth: Payer: Self-pay | Admitting: Family Medicine

## 2023-09-09 VITALS — BP 164/78 | HR 71 | Temp 97.7°F | Resp 16

## 2023-09-09 DIAGNOSIS — R051 Acute cough: Secondary | ICD-10-CM | POA: Diagnosis not present

## 2023-09-09 DIAGNOSIS — J069 Acute upper respiratory infection, unspecified: Secondary | ICD-10-CM

## 2023-09-09 MED ORDER — PROMETHAZINE-DM 6.25-15 MG/5ML PO SYRP: 5.0000 mL | ORAL_SOLUTION | Freq: Four times a day (QID) | ORAL | 0 refills | Status: DC | PRN

## 2023-09-09 MED ORDER — PREDNISONE 20 MG PO TABS: 40.0000 mg | ORAL_TABLET | Freq: Every day | ORAL | 0 refills | Status: DC

## 2023-09-09 MED ORDER — FLUTICASONE PROPIONATE HFA 110 MCG/ACT IN AERO: 2.0000 | INHALATION_SPRAY | Freq: Two times a day (BID) | RESPIRATORY_TRACT | 0 refills | Status: DC

## 2023-09-09 NOTE — Telephone Encounter (Signed)
Patient's chest x-ray came back without acute cardiopulmonary abnormality.  Patient was called, verified identity with date of birth prior to giving results.  He states his blood sugars have been under excellent control since he got his Dexcom and is agreeable to several days on prednisone for treatment of bronchitis.  Will also send in a Flovent inhaler and Phenergan DM.  Discussed supportive over-the-counter medications, home care and return precautions.

## 2023-09-09 NOTE — ED Provider Notes (Signed)
[facility]    CSN: [id number] Arrival date & time: 09/09/23  1014      History   Chief Complaint Chief Complaint  Patient presents with   Cough    I have cough lasting 4-5 days. Light to medium phlegm. Swabbing ruled out Covid, Flu & RSV. I now suspect bronchitis. Can you perform chest X-ray at your facility? - Entered by patient    HPI Frederick Robinson. is a 62 y.o. male.   Patient presenting today with 2-week history of runny nose, sore throat, sinus congestion and ear pressure and now 4 to 5 days of progressively worsening hacking cough.  States he is coughing up yellow-green phlegm.  Denies chest pain, shortness of breath, fever, chills, body aches, abdominal pain, nausea vomiting or diarrhea.  Family members all sick with similar symptoms.  States son with same symptoms was tested for COVID flu and RSV at the pediatrician earlier this week all negative.  So far trying over-the-counter remedies with no relief.  Denies any history of chronic pulmonary disease.    Past Medical History:  Diagnosis Date   Arthritis 01/30/2023   B12 deficiency 07/27/2023   Diabetes mellitus without complication (HCC)    Hyperlipidemia    Hypertension    Melanoma (HCC)    Thyroid disease     Patient Active Problem List   Diagnosis Date Noted   Pernicious anemia 08/19/2023   Secondary hypertension 08/18/2023   DM (diabetes mellitus), type 2 with neurological complications (HCC) 08/18/2023   GERD without esophagitis 08/18/2023   Hepatic steatosis 07/27/2023   BPH (benign prostatic hyperplasia) 07/27/2023   B12 deficiency anemia 07/14/2023   Macrocytic anemia 07/09/2023   Vitamin D deficiency 07/09/2023   Low serum parathyroid hormone (PTH) 07/09/2023   Foot lesion 07/09/2023   Leg injury 06/22/2023   Family history of prostate cancer 06/22/2023   Apathetic 06/22/2023   CKD (chronic kidney disease) 06/22/2023   Snoring 06/22/2023   Memory loss 06/22/2023   Tendon  rupture, post-op 06/12/2023   Tear of medial meniscus of knee 05/03/2023   Peroneal tendon rupture, left, sequela 04/30/2023   Pain of joint of left ankle and foot 04/30/2023   Acquired cavovarus deformity of left foot 04/30/2023   History of melanoma 01/30/2023   Former smoker 07/12/2013   Resistant hypertension 09/12/2008   Allergic rhinitis 09/23/2007   Diabetes mellitus, type 2 (HCC) 05/13/2007   Hypothyroidism 05/10/2007   Hyperlipidemia associated with type 2 diabetes mellitus (HCC) 05/10/2007    Past Surgical History:  Procedure Laterality Date   COLONOSCOPY N/A 11/07/2014   Procedure: COLONOSCOPY;  Surgeon: Dalia Heading, MD;  [facility];  Service: Gastroenterology;  Laterality: N/A;  830   FRACTURE SURGERY  06/11/2023   KNEE SURGERY Right    REPAIR OF PERONEUS BREVIS TENDON Left 06/11/2023   Procedure: REPAIR OF PERONEUS BREVIS TENOLYSIS;  Surgeon: Toni Arthurs, MD;  Location: [facility];  Service: Orthopedics;  Laterality: Left;   TENDON REPAIR Left 06/11/2023   Procedure: LEFT PERONEUS LONGUS REPAIR;  Surgeon: Toni Arthurs, MD;  Location: [facility];  Service: Orthopedics;  Laterality: Left;       Home Medications    Prior to Admission medications   Medication Sig Start Date End Date Taking? Authorizing Provider  amLODipine (NORVASC) 10 MG tablet TAKE 1 TABLET (10 MG TOTAL) BY MOUTH DAILY AT 10 PM. 07/21/23 10/19/23  Tolia, Sunit, DO  aspirin 81 MG tablet Take  81 mg by mouth daily.    [provider]  B-D UF III MINI PEN NEEDLES 31G X 5 MM MISC USE AS DIRECTED 06/24/23   Lula Olszewski, MD  carvedilol (COREG) 12.5 MG tablet TAKE 1 TABLET (12.5MG  TOTAL) BY MOUTH TWICE A DAY WITH MEALS 05/18/23   Tolia, Sunit, DO  Cobalamin Combinations (B-12) 1000-400 MCG SUBL Place 1 tablet under the tongue daily. 07/27/23   Lula Olszewski, MD  Continuous Glucose Sensor (DEXCOM G6 SENSOR) MISC USE AS DIRECTED. NEED TO REPLACE EVERY 10  DAYS. 06/24/23   Lula Olszewski, MD  Continuous Glucose Transmitter (DEXCOM G6 TRANSMITTER) MISC USE AS DIRECTED 06/24/23   Lula Olszewski, MD  Cyanocobalamin (B-12 COMPLIANCE INJECTION) 1000 MCG/ML KIT Inject 1 mg as directed once a week. 08/20/23   Lula Olszewski, MD  dapagliflozin propanediol (FARXIGA) 10 MG TABS tablet Take by mouth daily.    [provider]  diclofenac Sodium (VOLTAREN) 1 % GEL USE AS DIRECTED 06/24/23   Lula Olszewski, MD  Evolocumab (REPATHA SURECLICK) 140 MG/ML SOAJ INJECT 140 MG INTO THE SKIN EVERY 14 (FOURTEEN) DAYS. 04/23/23   Yates Decamp, MD  fenofibrate micronized (LOFIBRA) 200 MG capsule daily before breakfast.    [provider]  Finerenone (KERENDIA) 10 MG TABS Take 1 tablet (10 mg total) by mouth daily. 08/20/23   Lula Olszewski, MD  fluticasone (FLOVENT HFA) 110 MCG/ACT inhaler Inhale 2 puffs into the lungs 2 (two) times daily. 09/09/23   Particia Nearing, PA-C  icosapent Ethyl (VASCEPA) 1 g capsule Take 2 capsules (2 g total) by mouth 2 (two) times daily. 07/09/23   Lula Olszewski, MD  isosorbide-hydrALAZINE (BIDIL) 20-37.5 MG tablet TAKE 1 TABLET BY MOUTH THREE TIMES A DAY 12/11/22   Tolia, Sunit, DO  levothyroxine (SYNTHROID) 112 MCG tablet Take 224 mcg by mouth daily.    [provider]  loratadine (CLARITIN) 10 MG tablet Take 10 mg by mouth daily.    [provider]  losartan (COZAAR) 100 MG tablet Take 1 tablet (100 mg total) by mouth daily at 10 pm. 06/23/23 09/21/23  Odis Hollingshead, Sunit, DO  predniSONE (DELTASONE) 20 MG tablet Take 2 tablets (40 mg total) by mouth daily with breakfast. 09/09/23   Particia Nearing, PA-C  promethazine-dextromethorphan (PROMETHAZINE-DM) 6.25-15 MG/5ML syrup Take 5 mLs by mouth 4 (four) times daily as needed. 09/09/23   Particia Nearing, PA-C  rosuvastatin (CRESTOR) 40 MG tablet Take 40 mg by mouth daily.    [provider]  TRESIBA FLEXTOUCH 200 UNIT/ML FlexTouch Pen Inject  50 Units into the skin in the morning and at bedtime. 12/02/21   [provider]    Family History Family History  Problem Relation Age of Onset   Cancer Mother    Kidney disease Mother    Heart disease Father    Heart attack Father    Cancer Father        liver cancer   Diabetes Father    Obesity Father    Cancer Brother 45    Social History Social History   Tobacco Use   Smoking status: Former    Current packs/day: 0.00    Average packs/day: 2.0 packs/day for 25.0 years (50.0 ttl pk-yrs)    Types: Cigarettes, Cigars    Start date: 07/12/1984    Quit date: 07/12/2009    Years since quitting: 14.1   Smokeless tobacco: Never  Vaping Use   Vaping status: Never  Used  Substance Use Topics   Alcohol use: Yes    Alcohol/week: 16.0 standard drinks of alcohol    Types: 6 Glasses of wine, 10 Standard drinks or equivalent per week    Comment: weekends   Drug use: No     Allergies   Pravastatin sodium   Review of Systems Review of Systems Per HPI  Physical Exam Triage Vital Signs ED Triage Vitals  Encounter Vitals Group     BP 09/09/23 1105 (!) 164/78     Systolic BP Percentile --      Diastolic BP Percentile --      Pulse Rate 09/09/23 1105 71     Resp 09/09/23 1105 16     Temp 09/09/23 1105 97.7 F (36.5 C)     Temp Source 09/09/23 1105 Oral     SpO2 09/09/23 1105 96 %     Weight --      Height --      Head Circumference --      Peak Flow --      Pain Score 09/09/23 1106 0     Pain Loc --      Pain Education --      Exclude from Growth Chart --    No data found.  Updated Vital Signs BP (!) 164/78 (BP Location: Right Arm)   Pulse 71   Temp 97.7 F (36.5 C) (Oral)   Resp 16   SpO2 96%   Visual Acuity Right Eye Distance:   Left Eye Distance:   Bilateral Distance:    Right Eye Near:   Left Eye Near:    Bilateral Near:     Physical Exam Vitals and nursing note reviewed.  Constitutional:      Appearance: He is well-developed.  HENT:      Head: Atraumatic.     Right Ear: External ear normal.     Left Ear: External ear normal.     Nose: Rhinorrhea present.     Mouth/Throat:     Pharynx: Posterior oropharyngeal erythema present. No oropharyngeal exudate.  Eyes:     Conjunctiva/sclera: Conjunctivae normal.     Pupils: Pupils are equal, round, and reactive to light.  Cardiovascular:     Rate and Rhythm: Normal rate and regular rhythm.  Pulmonary:     Effort: Pulmonary effort is normal. No respiratory distress.     Breath sounds: No wheezing or rales.  Musculoskeletal:        General: Normal range of motion.     Cervical back: Normal range of motion and neck supple.  Lymphadenopathy:     Cervical: No cervical adenopathy.  Skin:    General: Skin is warm and dry.  Neurological:     Mental Status: He is alert and oriented to person, place, and time.  Psychiatric:        Behavior: Behavior normal.      UC Treatments / Results  Labs (all labs ordered are listed, but only abnormal results are displayed) Labs Reviewed - No data to display  EKG   Radiology DG Chest 2 View  Result Date: 09/09/2023 CLINICAL DATA:  Cough, wheezing, and chest tightness for the past 2 weeks. EXAM: CHEST - 2 VIEW COMPARISON:  Chest x-ray dated August 21, 2022. FINDINGS: The heart size and mediastinal contours are within normal limits. Normal pulmonary vascularity. No focal consolidation, pleural effusion, or pneumothorax. No acute osseous abnormality. IMPRESSION: No active cardiopulmonary disease. Electronically Signed   By: Vickki Hearing.D.  On: 09/09/2023 14:34    Procedures Procedures (including critical care time)  Medications Ordered in UC Medications - No data to display  Initial Impression / Assessment and Plan / UC Course  I have reviewed the triage vital signs and the nursing notes.  Pertinent labs & imaging results that were available during my care of the patient were reviewed by me and considered in my medical  decision making (see chart for details).     Mildly hypertensive in triage, otherwise vital signs within normal limits.  He is overall well-appearing and in no acute distress.  Suspect viral etiology, chest x-ray negative for acute cardiopulmonary abnormality.  Will treat for bronchitis with short course of prednisone being mindful of blood sugar readings which she states are under very good control currently.  Flovent inhaler, Phenergan DM, supportive over-the-counter medications and home care additionally.  Return for worsening symptoms.  Final Clinical Impressions(s) / UC Diagnoses   Final diagnoses:  Acute cough  Upper respiratory tract infection, unspecified type     Discharge Instructions      We will call you once the radiologist read your chest x-ray and come up with a plan including prescription medications at that time.    ED Prescriptions   None    PDMP not reviewed this encounter.   Particia Nearing, [place] 09/09/23 [phone]

## 2023-09-09 NOTE — Discharge Instructions (Signed)
We will call you once the radiologist read your chest x-ray and come up with a plan including prescription medications at that time.

## 2023-09-09 NOTE — ED Triage Notes (Signed)
Pt c/o cough, chest congestion lasting x 5 days. Coughing up yellowish green phlegm. Son Was Swabbed to rule out Covid, Flu & RSV due to same sx's. Pt now suspects bronchitis. Pt would like a chest x-ray. Started with nasal congestion,sore throat, cough, swollen lymph nodes, ear tingling.

## 2023-09-14 ENCOUNTER — Other Ambulatory Visit: Payer: Self-pay

## 2023-09-14 MED ORDER — PROMETHAZINE-DM 6.25-15 MG/5ML PO SYRP: 5.0000 mL | ORAL_SOLUTION | Freq: Four times a day (QID) | ORAL | 0 refills | Status: DC | PRN

## 2023-09-18 ENCOUNTER — Other Ambulatory Visit: Payer: Self-pay | Admitting: Family Medicine

## 2023-09-18 ENCOUNTER — Encounter: Payer: Self-pay | Admitting: Internal Medicine

## 2023-09-20 ENCOUNTER — Encounter: Payer: Self-pay | Admitting: Internal Medicine

## 2023-09-20 NOTE — Progress Notes (Signed)
Reviewed abdomen ultrasound (09/18/2023) showing fatty liver infiltration and mild splenic enlargement. Hepatic elastography 1.9 kPa indicates normal liver stiffness without significant fibrosis. Continue current management plan. Detailed explanation in Patient Message.

## 2023-09-21 ENCOUNTER — Telehealth: Payer: Self-pay

## 2023-09-21 NOTE — Telephone Encounter (Signed)
Patient was mailed disposable home sleep study device on 09/02/23. As of 09/21/23 device has not been used and no contact has been made from the patient. The sleep lab has attempted to contact the patient to use their sleep study device but has not heard back from the patient. Device has now been disconnected in [employer] portal. Patient may be charged $300 fee for not using device within time frame given per instructions given with home sleep study.

## 2023-09-23 NOTE — Telephone Encounter (Signed)
Patient's review of lab results/notes confirmed.

## 2023-09-23 NOTE — Telephone Encounter (Signed)
Results were sent to patient by Dr. Jon Billings.

## 2023-09-25 ENCOUNTER — Encounter: Payer: Self-pay | Admitting: Physician Assistant

## 2023-09-25 ENCOUNTER — Ambulatory Visit (INDEPENDENT_AMBULATORY_CARE_PROVIDER_SITE_OTHER): Payer: 59 | Admitting: Physician Assistant

## 2023-09-25 VITALS — BP 120/66 | HR 57 | Temp 97.5°F | Ht 75.0 in | Wt 235.4 lb

## 2023-09-25 DIAGNOSIS — R079 Chest pain, unspecified: Secondary | ICD-10-CM | POA: Diagnosis not present

## 2023-09-25 DIAGNOSIS — J069 Acute upper respiratory infection, unspecified: Secondary | ICD-10-CM | POA: Diagnosis not present

## 2023-09-25 MED ORDER — AZITHROMYCIN 250 MG PO TABS
ORAL_TABLET | ORAL | 0 refills | Status: AC
Start: 2023-09-25 — End: 2023-09-30

## 2023-09-25 NOTE — Patient Instructions (Signed)
It was great to see you!  EKG does not show any acute issues today  Please start oral azithromycin  I will also send in inhaler  If any worsening symptom(s), please go to the ER.  Take care,  Jarold Motto PA-C

## 2023-09-25 NOTE — Progress Notes (Signed)
Frederick Robinson. is a 62 y.o. male here for a follow-up of a pre-existing problem.  History of Present Illness:   Chief Complaint  Patient presents with   URI    Pt c/o persistent symptoms cough this morning with chest discomfort, prior to prednisone was expectorating green sputum. Denies fever or chills. Oxygen level was 94 this morning.   HPI   URI: On 10/23 presented to [facility] for acute cough. Was mildly hypertensive, otherwise vitals WNL, and appeared well and in no acute distress. Xray: no active cardiopulmonary disease Prescribed Prednisone to treat - total course of 6 tablets taking 40 mg at breakfast daily.  He presents today for persistence of URI despite completing treatment. States he was feeling improved for about 5 days after completing Prednisone, but symptoms returned this morning.  Notes expectoration resolved with Prednisone tx, but this morning he was coughing with chest discomfort (bilateral but R>L) felt like he needed to clear his lungs.  Denies shortness of breath at this time Reports sick contact with his 9 year old grandson and wife.  He requests an EKG to rule out cardio contributions  Denies hx of asthma or COPD, sore throat, or sinus pressure.    Past Medical History:  Diagnosis Date   Arthritis 01/30/2023   B12 deficiency 07/27/2023   Diabetes mellitus without complication (HCC)    Hyperlipidemia    Hypertension    Melanoma (HCC)    Thyroid disease     Social History   Tobacco Use   Smoking status: Former    Current packs/day: 0.00    Average packs/day: 2.0 packs/day for 25.0 years (50.0 ttl pk-yrs)    Types: Cigarettes, Cigars    Start date: 07/12/1984    Quit date: 07/12/2009    Years since quitting: 14.2   Smokeless tobacco: Never  Vaping Use   Vaping status: Never Used  Substance Use Topics   Alcohol use: Yes    Alcohol/week: 16.0 standard drinks of alcohol    Types: 6 Glasses of wine, 10 Standard drinks or equivalent per  week    Comment: weekends   Drug use: No   Past Surgical History:  Procedure Laterality Date   COLONOSCOPY N/A 11/07/2014   Procedure: COLONOSCOPY;  Surgeon: Dalia Heading, MD;  [facility];  Service: Gastroenterology;  Laterality: N/A;  830   FRACTURE SURGERY  06/11/2023   KNEE SURGERY Right    REPAIR OF PERONEUS BREVIS TENDON Left 06/11/2023   Procedure: REPAIR OF PERONEUS BREVIS TENOLYSIS;  Surgeon: Toni Arthurs, MD;  Location: [facility];  Service: Orthopedics;  Laterality: Left;   TENDON REPAIR Left 06/11/2023   Procedure: LEFT PERONEUS LONGUS REPAIR;  Surgeon: Toni Arthurs, MD;  Location: [facility];  Service: Orthopedics;  Laterality: Left;   Family History  Problem Relation Age of Onset   Cancer Mother    Kidney disease Mother    Heart disease Father    Heart attack Father    Cancer Father        liver cancer   Diabetes Father    Obesity Father    Cancer Brother 19   Allergies  Allergen Reactions   Pravastatin Sodium     REACTION: HIVES   Current Medications:   Current Outpatient Medications:    amLODipine (NORVASC) 10 MG tablet, TAKE 1 TABLET (10 MG TOTAL) BY MOUTH DAILY AT 10 PM., Disp: 90 tablet, Rfl: 0   aspirin 81 MG tablet, Take  81 mg by mouth daily., Disp: , Rfl:    azithromycin (ZITHROMAX) 250 MG tablet, Take 2 tablets on day 1, then 1 tablet daily on days 2 through 5, Disp: 6 tablet, Rfl: 0   B-D UF III MINI PEN NEEDLES 31G X 5 MM MISC, USE AS DIRECTED, Disp: 100 each, Rfl: 5   carvedilol (COREG) 12.5 MG tablet, TAKE 1 TABLET (12.5MG  TOTAL) BY MOUTH TWICE A DAY WITH MEALS, Disp: 180 tablet, Rfl: 1   Cobalamin Combinations (B-12) 1000-400 MCG SUBL, Place 1 tablet under the tongue daily., Disp: 90 tablet, Rfl: 3   Continuous Glucose Sensor (DEXCOM G6 SENSOR) MISC, USE AS DIRECTED. NEED TO REPLACE EVERY 10 DAYS., Disp: 9 each, Rfl: 3   Continuous Glucose Transmitter (DEXCOM G6 TRANSMITTER) MISC, USE AS DIRECTED, Disp:  1 each, Rfl: 0   Cyanocobalamin (B-12 COMPLIANCE INJECTION) 1000 MCG/ML KIT, Inject 1 mg as directed once a week., Disp: 4 kit, Rfl: 11   dapagliflozin propanediol (FARXIGA) 10 MG TABS tablet, Take by mouth daily., Disp: , Rfl:    diclofenac Sodium (VOLTAREN) 1 % GEL, USE AS DIRECTED, Disp: 50 g, Rfl: 2   Evolocumab (REPATHA SURECLICK) 140 MG/ML SOAJ, INJECT 140 MG INTO THE SKIN EVERY 14 (FOURTEEN) DAYS., Disp: 6 mL, Rfl: 1   fenofibrate micronized (LOFIBRA) 200 MG capsule, daily before breakfast., Disp: , Rfl:    icosapent Ethyl (VASCEPA) 1 g capsule, Take 2 capsules (2 g total) by mouth 2 (two) times daily., Disp: 180 capsule, Rfl: 32   isosorbide-hydrALAZINE (BIDIL) 20-37.5 MG tablet, TAKE 1 TABLET BY MOUTH THREE TIMES A DAY, Disp: 270 tablet, Rfl: 1   levothyroxine (SYNTHROID) 112 MCG tablet, Take 224 mcg by mouth daily., Disp: , Rfl:    loratadine (CLARITIN) 10 MG tablet, Take 10 mg by mouth daily., Disp: , Rfl:    rosuvastatin (CRESTOR) 40 MG tablet, Take 40 mg by mouth daily., Disp: , Rfl:    TRESIBA FLEXTOUCH 200 UNIT/ML FlexTouch Pen, Inject 50 Units into the skin in the morning and at bedtime., Disp: , Rfl:    Finerenone (KERENDIA) 10 MG TABS, Take 1 tablet (10 mg total) by mouth daily. (Patient not taking: Reported on 09/25/2023), Disp: 28 tablet, Rfl: 0   losartan (COZAAR) 100 MG tablet, Take 1 tablet (100 mg total) by mouth daily at 10 pm., Disp: 90 tablet, Rfl: 0   promethazine-dextromethorphan (PROMETHAZINE-DM) 6.25-15 MG/5ML syrup, Take 5 mLs by mouth 4 (four) times daily as needed. (Patient not taking: Reported on 09/25/2023), Disp: 100 mL, Rfl: 0  Review of Systems:   ROS See pertinent positives and negatives as per the HPI.  Vitals:   Vitals:   09/25/23 1131  BP: 120/66  Pulse: (!) 57  Temp: (!) 97.5 F (36.4 C)  TempSrc: Temporal  SpO2: 99%  Weight: 235 lb 6.1 oz (106.8 kg)  Height: 6\' 3"  (1.905 m)     Body mass index is 29.42 kg/m.  Physical Exam:   Physical  Exam Vitals and nursing note reviewed.  Constitutional:      General: He is not in acute distress.    Appearance: He is well-developed. He is not ill-appearing or toxic-appearing.  HENT:     Head: Normocephalic and atraumatic.     Right Ear: Tympanic membrane, ear canal and external ear normal. Tympanic membrane is not erythematous, retracted or bulging.     Left Ear: Tympanic membrane, ear canal and external ear normal. Tympanic membrane is not erythematous, retracted or bulging.  Nose: Nose normal.     Right Sinus: No maxillary sinus tenderness or frontal sinus tenderness.     Left Sinus: No maxillary sinus tenderness or frontal sinus tenderness.     Mouth/Throat:     Pharynx: Uvula midline. No posterior oropharyngeal erythema.  Eyes:     General: Lids are normal.     Conjunctiva/sclera: Conjunctivae normal.  Neck:     Trachea: Trachea normal.  Cardiovascular:     Rate and Rhythm: Normal rate and regular rhythm.     Pulses: Normal pulses.     Heart sounds: Normal heart sounds, S1 normal and S2 normal.  Pulmonary:     Effort: Pulmonary effort is normal.     Breath sounds: Normal breath sounds. No decreased breath sounds, wheezing, rhonchi or rales.  Lymphadenopathy:     Cervical: No cervical adenopathy.  Skin:    General: Skin is warm and dry.  Neurological:     Mental Status: He is alert.     GCS: GCS eye subscore is 4. GCS verbal subscore is 5. GCS motor subscore is 6.  Psychiatric:        Speech: Speech normal.        Behavior: Behavior normal. Behavior is cooperative.     Assessment and Plan:   Chest pain, unspecified type; Upper respiratory tract infection, unspecified type EKG tracing is personally reviewed.  EKG notes bradycardia with AV 1st degree block.  No acute changes.   No red flags on exam.  Will initiate azithromycin per orders. Discussed taking medications as prescribed. Reviewed return precautions including worsening fever, SOB, worsening cough or  other concerns. Push fluids and rest. I recommend that patient follow-up if symptoms worsen or persist despite treatment x 7-10 days, sooner if needed.  I,Emily Lagle,acting as a Neurosurgeon for [employer], PA.,have documented all relevant documentation on the behalf of Jarold Motto, PA,as directed by  Jarold Motto, PA while in the presence of Jarold Motto, [place].  I, Jarold Motto, [place], have reviewed all documentation for this visit. The documentation on 09/25/23 for the exam, diagnosis, procedures, and orders are all accurate and complete.  Jarold Motto, PA-C

## 2023-10-06 ENCOUNTER — Ambulatory Visit (INDEPENDENT_AMBULATORY_CARE_PROVIDER_SITE_OTHER): Payer: 59 | Admitting: Gastroenterology

## 2023-10-06 ENCOUNTER — Encounter: Payer: Self-pay | Admitting: Gastroenterology

## 2023-10-06 ENCOUNTER — Other Ambulatory Visit (INDEPENDENT_AMBULATORY_CARE_PROVIDER_SITE_OTHER): Payer: 59

## 2023-10-06 VITALS — BP 130/64 | HR 75 | Ht 75.0 in | Wt 243.4 lb

## 2023-10-06 DIAGNOSIS — D519 Vitamin B12 deficiency anemia, unspecified: Secondary | ICD-10-CM

## 2023-10-06 DIAGNOSIS — R634 Abnormal weight loss: Secondary | ICD-10-CM

## 2023-10-06 LAB — IBC + FERRITIN
Ferritin: 16.2 ng/mL — ABNORMAL LOW (ref 22.0–322.0)
Iron: 115 ug/dL (ref 42–165)
Saturation Ratios: 23.8 % (ref 20.0–50.0)
TIBC: 483 ug/dL — ABNORMAL HIGH (ref 250.0–450.0)
Transferrin: 345 mg/dL (ref 212.0–360.0)

## 2023-10-06 LAB — CBC WITH DIFFERENTIAL/PLATELET
Basophils Absolute: 0.1 10*3/uL (ref 0.0–0.1)
Basophils Relative: 1.1 % (ref 0.0–3.0)
Eosinophils Absolute: 0.3 10*3/uL (ref 0.0–0.7)
Eosinophils Relative: 4.3 % (ref 0.0–5.0)
HCT: 39.1 % (ref 39.0–52.0)
Hemoglobin: 13 g/dL (ref 13.0–17.0)
Lymphocytes Relative: 40.6 % (ref 12.0–46.0)
Lymphs Abs: 2.4 10*3/uL (ref 0.7–4.0)
MCHC: 33.3 g/dL (ref 30.0–36.0)
MCV: 90.5 fL (ref 78.0–100.0)
Monocytes Absolute: 0.5 10*3/uL (ref 0.1–1.0)
Monocytes Relative: 8.2 % (ref 3.0–12.0)
Neutro Abs: 2.7 10*3/uL (ref 1.4–7.7)
Neutrophils Relative %: 45.8 % (ref 43.0–77.0)
Platelets: 235 10*3/uL (ref 150.0–400.0)
RBC: 4.31 Mil/uL (ref 4.22–5.81)
RDW: 13.4 % (ref 11.5–15.5)
WBC: 5.9 10*3/uL (ref 4.0–10.5)

## 2023-10-06 LAB — COMPREHENSIVE METABOLIC PANEL
ALT: 17 U/L (ref 0–53)
AST: 22 U/L (ref 0–37)
Albumin: 4.5 g/dL (ref 3.5–5.2)
Alkaline Phosphatase: 48 U/L (ref 39–117)
BUN: 20 mg/dL (ref 6–23)
CO2: 25 meq/L (ref 19–32)
Calcium: 9.8 mg/dL (ref 8.4–10.5)
Chloride: 107 meq/L (ref 96–112)
Creatinine, Ser: 1.3 mg/dL (ref 0.40–1.50)
GFR: 58.44 mL/min — ABNORMAL LOW (ref >60.00)
Glucose, Bld: 110 mg/dL — ABNORMAL HIGH (ref 70–99)
Potassium: 4.6 meq/L (ref 3.5–5.1)
Sodium: 140 meq/L (ref 135–145)
Total Bilirubin: 0.4 mg/dL (ref 0.2–1.2)
Total Protein: 7.3 g/dL (ref 6.0–8.3)

## 2023-10-06 LAB — VITAMIN B12: Vitamin B-12: 194 pg/mL — ABNORMAL LOW (ref 211–911)

## 2023-10-06 NOTE — Patient Instructions (Addendum)
You have been scheduled for an endoscopy and colonoscopy. Please follow the written instructions given to you at your visit today.  Please pick up your prep supplies at the pharmacy within the next 1-3 days.  If you use inhalers (even only as needed), please bring them with you on the day of your procedure.  DO NOT TAKE 7 DAYS PRIOR TO TEST- Trulicity (dulaglutide) Ozempic, Wegovy (semaglutide) Mounjaro (tirzepatide) Bydureon Bcise (exanatide extended release)  DO NOT TAKE 1 DAY PRIOR TO YOUR TEST Rybelsus (semaglutide) Adlyxin (lixisenatide) Victoza (liraglutide) Byetta (exanatide) ___________________________________________________________________________   Please go to the lab in the basement of our building to have lab work done as you leave today. Hit "B" for basement when you get on the elevator.  When the doors open the lab is on your left.  We will call you with the results. Thank you.   Thank you for trusting me with your gastrointestinal care!   Boone Master, PA      If your blood pressure at your visit was 140/90 or greater, please contact your primary care physician to follow up on this. ______________________________________________________  If you are age 24 or older, your body mass index should be between 23-30. Your Body mass index is 30.42 kg/m. If this is out of the aforementioned range listed, please consider follow up with your Primary Care Provider.  If you are age 24 or younger, your body mass index should be between 19-25. Your Body mass index is 30.42 kg/m. If this is out of the aformentioned range listed, please consider follow up with your Primary Care Provider.  ________________________________________________________  The [facility] providers would like to encourage you to use [facility] to communicate with providers for non-urgent requests or questions.  Due to long hold times on the telephone, sending your provider a message by [facility] may be a  faster and more efficient way to get a response.  Please allow 48 business hours for a response.  Please remember that this is for non-urgent requests.  _______________________________________________________  Due to recent changes in healthcare laws, you may see the results of your imaging and laboratory studies on MyChart before your provider has had a chance to review them.  We understand that in some cases there may be results that are confusing or concerning to you. Not all laboratory results come back in the same time frame and the provider may be waiting for multiple results in order to interpret others.  Please give [place] 48 hours in order for your provider to thoroughly review all the results before contacting the office for clarification of your results.

## 2023-10-06 NOTE — Progress Notes (Signed)
Chief Complaint: Anemia Primary GI MD: Gentry Fitz  HPI: 62 year old male with past medical history including diabetes, melanoma, hyperlipidemia, hypertension, thyroid disease, B12 deficiency, presents for evaluation of anemia. Referred here by PCP for evaluation of anemia.  Had CBC August 2024 with hemoglobin 11.4, MCV 94.1.  Hemoglobin was 12.8 last year.  PCP thought this was secondary to pernicious anemia/B12 deficiency and intrinsic antibody was positive.  His B12 levels had improved on sublingual B12.  No repeat CBC has been drawn. Iron 75, Sat 20%, ferritin 44  Patient underwent renal ultrasound September 2024 for chronic kidney disease and was found to have hepatic steatosis.   He then underwent elastography 09/04/2023 with hepatic parenchyma consistent with fatty liver infiltration.  Portal vein is patent.  kPa 1.9.  Previous lab draw 08/17/2023 with AST 31/ALT 25/alk phos 56  History of colonoscopy 10/2014 at [facility] done for screening - Normal colonoscopy - Repeat 10 years  Discussed the use of AI scribe software for clinical note transcription with the patient, who gave verbal consent to proceed.  The patient, with a history of diabetes and hypertension, presents with a concern about an unexplained anemia that was discovered in July. The patient reports that despite extensive investigations by his primary care physician, the cause of the anemia remains unknown. The patient also has a history of B12 deficiency, which has shown improvement with treatment. Denies previous weight loss surgery. The patient's diabetes is well-controlled with a Dexcom device, and his blood pressure is also under control.   The patient also reports a recent weight loss of about 10 pounds over the last few months, which may be due to changes in diet and appetite as he was on ozempic as well at this time. He has been off ozempic for a few months as it caused GI distress.  He has no GI symptoms.  Had minor transient diarrhea a few weeks ago when his family caught the stomach bug which has resolved.  He has a 92 year old son he wants to stay healthy and active for.     PREVIOUS GI WORKUP   History of colonoscopy 10/2014 at [facility] done for screening - Normal colonoscopy - Repeat 10 years  Past Medical History:  Diagnosis Date   Arthritis 01/30/2023   B12 deficiency 07/27/2023   Coronary artery calcification    Diabetes mellitus without complication (HCC)    Hyperlipidemia    Hypertension    Melanoma (HCC)    Thyroid disease     Past Surgical History:  Procedure Laterality Date   COLONOSCOPY N/A 11/07/2014   Procedure: COLONOSCOPY;  Surgeon: Dalia Heading, MD;  Location: [facility];  Service: Gastroenterology;  Laterality: N/A;  830   FRACTURE SURGERY  06/11/2023   KNEE SURGERY Right    REPAIR OF PERONEUS BREVIS TENDON Left 06/11/2023   Procedure: REPAIR OF PERONEUS BREVIS TENOLYSIS;  Surgeon: Toni Arthurs, MD;  Location: [facility];  Service: Orthopedics;  Laterality: Left;   TENDON REPAIR Left 06/11/2023   Procedure: LEFT PERONEUS LONGUS REPAIR;  Surgeon: Toni Arthurs, MD;  Location: [facility];  Service: Orthopedics;  Laterality: Left;    Current Outpatient Medications  Medication Sig Dispense Refill   amLODipine (NORVASC) 10 MG tablet TAKE 1 TABLET (10 MG TOTAL) BY MOUTH DAILY AT 10 PM. 90 tablet 0   aspirin 81 MG tablet Take 81 mg by mouth daily.     B-D UF III MINI PEN NEEDLES  31G X 5 MM MISC USE AS DIRECTED 100 each 5   carvedilol (COREG) 12.5 MG tablet TAKE 1 TABLET (12.5MG  TOTAL) BY MOUTH TWICE A DAY WITH MEALS 180 tablet 1   Cobalamin Combinations (B-12) 1000-400 MCG SUBL Place 1 tablet under the tongue daily. 90 tablet 3   Continuous Glucose Sensor (DEXCOM G6 SENSOR) MISC USE AS DIRECTED. NEED TO REPLACE EVERY 10 DAYS. 9 each 3   Continuous Glucose Transmitter (DEXCOM G6 TRANSMITTER) MISC USE AS DIRECTED 1 each 0    dapagliflozin propanediol (FARXIGA) 10 MG TABS tablet Take by mouth daily.     diclofenac Sodium (VOLTAREN) 1 % GEL USE AS DIRECTED 50 g 2   Evolocumab (REPATHA SURECLICK) 140 MG/ML SOAJ INJECT 140 MG INTO THE SKIN EVERY 14 (FOURTEEN) DAYS. 6 mL 1   fenofibrate micronized (LOFIBRA) 200 MG capsule daily before breakfast.     Finerenone (KERENDIA) 10 MG TABS Take 1 tablet (10 mg total) by mouth daily. 28 tablet 0   icosapent Ethyl (VASCEPA) 1 g capsule Take 2 capsules (2 g total) by mouth 2 (two) times daily. 180 capsule 32   isosorbide-hydrALAZINE (BIDIL) 20-37.5 MG tablet TAKE 1 TABLET BY MOUTH THREE TIMES A DAY 270 tablet 1   levothyroxine (SYNTHROID) 112 MCG tablet Take 224 mcg by mouth daily.     loratadine (CLARITIN) 10 MG tablet Take 10 mg by mouth daily.     rosuvastatin (CRESTOR) 40 MG tablet Take 40 mg by mouth daily.     TRESIBA FLEXTOUCH 200 UNIT/ML FlexTouch Pen Inject 50 Units into the skin in the morning and at bedtime.     losartan (COZAAR) 100 MG tablet Take 1 tablet (100 mg total) by mouth daily at 10 pm. 90 tablet 0   No current facility-administered medications for this visit.    Allergies as of 10/06/2023 - Review Complete 10/06/2023  Allergen Reaction Noted   Pravastatin sodium      Family History  Problem Relation Age of Onset   Cancer Mother    Kidney disease Mother    Heart disease Father    Heart attack Father    Cancer Father        liver cancer   Diabetes Father    Obesity Father    Cancer Brother 71    Social History   Socioeconomic History   Marital status: Married    Spouse name: Marcelino Duster   Number of children: 1   Years of education: Not on file   Highest education level: Bachelor's degree (e.g., BA, AB, BS)  Occupational History   Not on file  Tobacco Use   Smoking status: Former    Current packs/day: 0.00    Average packs/day: 2.0 packs/day for 25.0 years (50.0 ttl pk-yrs)    Types: Cigarettes, Cigars    Start date: 07/12/1984    Quit  date: 07/12/2009    Years since quitting: 14.2   Smokeless tobacco: Never  Vaping Use   Vaping status: Never Used  Substance and Sexual Activity   Alcohol use: Yes    Alcohol/week: 16.0 standard drinks of alcohol    Types: 6 Glasses of wine, 10 Standard drinks or equivalent per week    Comment: weekends   Drug use: No   Sexual activity: Yes    Birth control/protection: None  Other Topics Concern   Not on file  Social History Narrative   Not on file   Social Determinants of Health   Financial Resource Strain: Low Risk  (  07/18/2023)   Overall Financial Resource Strain (CARDIA)    Difficulty of Paying Living Expenses: Not very hard  Food Insecurity: No Food Insecurity (07/18/2023)   Hunger Vital Sign    Worried About Running Out of Food in the Last Year: Never true    Ran Out of Food in the Last Year: Never true  Transportation Needs: No Transportation Needs (07/18/2023)   PRAPARE - Administrator, Civil Service (Medical): No    Lack of Transportation (Non-Medical): No  Physical Activity: Insufficiently Active (07/18/2023)   Exercise Vital Sign    Days of Exercise per Week: 3 days    Minutes of Exercise per Session: 30 min  Stress: No Stress Concern Present (07/18/2023)   [employer] of Occupational Health - Occupational Stress Questionnaire    Feeling of Stress : Only a little  Social Connections: Moderately Integrated (07/18/2023)   Social Connection and Isolation Panel [NHANES]    Frequency of Communication with Friends and Family: Never    Frequency of Social Gatherings with Friends and Family: Never    Attends Religious Services: More than 4 times per year    Active Member of [employer] or Organizations: Yes    Attends Banker Meetings: More than 4 times per year    Marital Status: Married  Catering manager Violence: Unknown (06/26/2023)   Received from [facility]   HITS    Physically Hurt: Not on file    Insult or Talk Down To: Not on file     Threaten Physical Harm: Not on file    Scream or Curse: Not on file    Review of Systems:    Constitutional: No weight loss, fever, chills, weakness or fatigue HEENT: Eyes: No change in vision               Ears, Nose, Throat:  No change in hearing or congestion Skin: No rash or itching Cardiovascular: No chest pain, chest pressure or palpitations   Respiratory: No SOB or cough Gastrointestinal: See HPI and otherwise negative Genitourinary: No dysuria or change in urinary frequency Neurological: No headache, dizziness or syncope Musculoskeletal: No new muscle or joint pain Hematologic: No bleeding or bruising Psychiatric: No history of depression or anxiety    Physical Exam:  Vital signs: BP 130/64   Pulse 75   Ht 6\' 3"  (1.905 m)   Wt 243 lb 6.4 oz (110.4 kg)   BMI 30.42 kg/m   Constitutional: NAD, Well developed, Well nourished, alert and cooperative Head:  Normocephalic and atraumatic. Eyes:   PEERL, EOMI. No icterus. Conjunctiva pink. Respiratory: Respirations even and unlabored. Lungs clear to auscultation bilaterally.   No wheezes, crackles, or rhonchi.  Cardiovascular:  Regular rate and rhythm. No peripheral edema, cyanosis or pallor.  Gastrointestinal:  Soft, nondistended, nontender. No rebound or guarding. Normal bowel sounds. No appreciable masses or hepatomegaly. Rectal:  Not performed.  Msk:  Symmetrical without gross deformities. Without edema, no deformity or joint abnormality.  Neurologic:  Alert and  oriented x4;  grossly normal neurologically.  Skin:   Dry and intact without significant lesions or rashes. Psychiatric: Oriented to person, place and time. Demonstrates good judgement and reason without abnormal affect or behaviors.   RELEVANT LABS AND IMAGING: CBC    Component Value Date/Time   WBC 4.9 07/06/2023 1011   RBC 3.68 (L) 07/06/2023 1011   HGB 11.4 (L) 07/06/2023 1011   HCT 34.7 (L) 07/06/2023 1011   PLT 209.0 07/06/2023 1011  MCV 94.1  07/06/2023 1011   MCH 31.1 05/08/2023 1343   MCHC 33.0 07/06/2023 1011   RDW 13.6 07/06/2023 1011   LYMPHSABS 1.4 07/06/2023 1011   MONOABS 0.3 07/06/2023 1011   EOSABS 0.3 07/06/2023 1011   BASOSABS 0.0 07/06/2023 1011    CMP     Component Value Date/Time   NA 145 (H) 08/17/2023 1156   K 4.6 08/17/2023 1156   CL 109 (H) 08/17/2023 1156   CO2 22 08/17/2023 1156   GLUCOSE 78 08/17/2023 1156   GLUCOSE 145 (H) 07/06/2023 1011   BUN 13 08/17/2023 1156   CREATININE 1.24 08/17/2023 1156   CALCIUM 9.6 08/17/2023 1156   PROT 7.2 08/17/2023 1156   ALBUMIN 4.5 08/17/2023 1156   AST 31 08/17/2023 1156   ALT 25 08/17/2023 1156   ALKPHOS 56 08/17/2023 1156   BILITOT 0.3 08/17/2023 1156   GFRNONAA 79 05/29/2023 0852   GFRAA 72 02/15/2018 1118     Assessment/Plan:      Anemia History of pernicious anemia in July with positive intrinsic antibodies, related to B12 deficiency. B12 levels have improved, but no recent complete blood count to assess current hemoglobin levels. No signs of iron deficiency with iron sat 20%, ferritin 44 and iron 78. No overt bleeding. No GI symptoms. EGD is reasonable for further evaluation. Patient is concerned about cancer. Hesitant about cologuard, wants colonoscopy with EGD. Due for colonoscopy next year. Needs to have these procedures done by next week for insurance purposes. -- repeat CBC, CMP, iron studies, B12 -- EGD/Colonoscopy for further evaluation -- if above are negative, continue workup with PCP -- I thoroughly discussed the procedure with the patient (at bedside) to include nature of the procedure, alternatives, benefits, and risks (including but not limited to bleeding, infection, perforation, anesthesia/cardiac pulmonary complications).  Patient verbalized understanding and gave verbal consent to proceed with procedure.   Fatty liver Diagnosed on [place] and underwent elastography with kPa of 1.9 which is normal and reassuring. liver enzymes  normal. --Continue healthy diet and exercise  Lara Mulch [facility] 10/06/2023, 3:29 PM  Cc: Lula Olszewski, MD

## 2023-10-08 ENCOUNTER — Ambulatory Visit: Payer: Self-pay | Admitting: Cardiology

## 2023-10-10 ENCOUNTER — Encounter: Payer: Self-pay | Admitting: Certified Registered Nurse Anesthetist

## 2023-10-13 ENCOUNTER — Telehealth: Payer: Self-pay | Admitting: Neurology

## 2023-10-13 ENCOUNTER — Other Ambulatory Visit: Payer: Self-pay

## 2023-10-13 ENCOUNTER — Other Ambulatory Visit: Payer: 59

## 2023-10-13 ENCOUNTER — Encounter: Payer: Self-pay | Admitting: Cardiology

## 2023-10-13 ENCOUNTER — Other Ambulatory Visit: Payer: Self-pay | Admitting: Cardiology

## 2023-10-13 ENCOUNTER — Other Ambulatory Visit: Payer: Self-pay | Admitting: Internal Medicine

## 2023-10-13 ENCOUNTER — Encounter: Payer: Self-pay | Admitting: Internal Medicine

## 2023-10-13 DIAGNOSIS — N182 Chronic kidney disease, stage 2 (mild): Secondary | ICD-10-CM

## 2023-10-13 DIAGNOSIS — I10 Essential (primary) hypertension: Secondary | ICD-10-CM

## 2023-10-13 DIAGNOSIS — S30860S Insect bite (nonvenomous) of lower back and pelvis, sequela: Secondary | ICD-10-CM

## 2023-10-13 DIAGNOSIS — R0683 Snoring: Secondary | ICD-10-CM

## 2023-10-13 DIAGNOSIS — R453 Demoralization and apathy: Secondary | ICD-10-CM

## 2023-10-13 DIAGNOSIS — I159 Secondary hypertension, unspecified: Secondary | ICD-10-CM

## 2023-10-13 DIAGNOSIS — K760 Fatty (change of) liver, not elsewhere classified: Secondary | ICD-10-CM

## 2023-10-13 DIAGNOSIS — R634 Abnormal weight loss: Secondary | ICD-10-CM

## 2023-10-13 DIAGNOSIS — G4733 Obstructive sleep apnea (adult) (pediatric): Secondary | ICD-10-CM

## 2023-10-13 DIAGNOSIS — D518 Other vitamin B12 deficiency anemias: Secondary | ICD-10-CM

## 2023-10-13 MED ORDER — SERTRALINE HCL 50 MG PO TABS
50.0000 mg | ORAL_TABLET | Freq: Every day | ORAL | 3 refills | Status: DC
Start: 2023-10-13 — End: 2024-03-01

## 2023-10-13 MED ORDER — KERENDIA 10 MG PO TABS: 10.0000 mg | ORAL_TABLET | Freq: Every day | ORAL | 3 refills | Status: DC

## 2023-10-13 MED ORDER — ISOSORB DINITRATE-HYDRALAZINE 20-37.5 MG PO TABS: 1.0000 | ORAL_TABLET | Freq: Three times a day (TID) | ORAL | 3 refills | Status: DC

## 2023-10-13 MED ORDER — AMLODIPINE BESYLATE 10 MG PO TABS: 10.0000 mg | ORAL_TABLET | Freq: Every day | ORAL | 3 refills | Status: DC

## 2023-10-13 MED ORDER — CARVEDILOL 12.5 MG PO TABS: 12.5000 mg | ORAL_TABLET | Freq: Two times a day (BID) | ORAL | 3 refills | Status: DC

## 2023-10-13 NOTE — Progress Notes (Signed)
Refills for Amlodipine, Carvedilol, and Isosorbide-Hydralazine (Bidil) sent to pharmacy. Repatha refill request forwarded to pharmacist. Pt is aware.

## 2023-10-13 NOTE — Addendum Note (Signed)
Addended by: Donzetta Starch on: 10/13/2023 10:26 AM   Modules accepted: Orders

## 2023-10-13 NOTE — Progress Notes (Signed)
Sent 90 day supply with 3 rf kerendia and sertraline per patient request

## 2023-10-13 NOTE — Telephone Encounter (Signed)
Rx has been sent

## 2023-10-13 NOTE — Telephone Encounter (Signed)
Pt is asking for a call regarding results to home sleep study

## 2023-10-14 ENCOUNTER — Ambulatory Visit: Payer: 59 | Admitting: Internal Medicine

## 2023-10-14 ENCOUNTER — Encounter: Payer: Self-pay | Admitting: Internal Medicine

## 2023-10-14 VITALS — BP 144/75 | HR 62 | Temp 97.8°F | Resp 18 | Ht 75.0 in | Wt 243.0 lb

## 2023-10-14 DIAGNOSIS — K31A19 Gastric intestinal metaplasia without dysplasia, unspecified site: Secondary | ICD-10-CM | POA: Insufficient documentation

## 2023-10-14 DIAGNOSIS — D125 Benign neoplasm of sigmoid colon: Secondary | ICD-10-CM

## 2023-10-14 DIAGNOSIS — D509 Iron deficiency anemia, unspecified: Secondary | ICD-10-CM | POA: Diagnosis not present

## 2023-10-14 DIAGNOSIS — D123 Benign neoplasm of transverse colon: Secondary | ICD-10-CM | POA: Diagnosis not present

## 2023-10-14 DIAGNOSIS — D122 Benign neoplasm of ascending colon: Secondary | ICD-10-CM | POA: Diagnosis not present

## 2023-10-14 DIAGNOSIS — K2940 Chronic atrophic gastritis without bleeding: Secondary | ICD-10-CM

## 2023-10-14 DIAGNOSIS — D124 Benign neoplasm of descending colon: Secondary | ICD-10-CM

## 2023-10-14 DIAGNOSIS — K31A0 Gastric intestinal metaplasia, unspecified: Secondary | ICD-10-CM

## 2023-10-14 DIAGNOSIS — R634 Abnormal weight loss: Secondary | ICD-10-CM | POA: Diagnosis not present

## 2023-10-14 DIAGNOSIS — Z860101 Personal history of adenomatous and serrated colon polyps: Secondary | ICD-10-CM | POA: Insufficient documentation

## 2023-10-14 DIAGNOSIS — K635 Polyp of colon: Secondary | ICD-10-CM | POA: Diagnosis not present

## 2023-10-14 DIAGNOSIS — E538 Deficiency of other specified B group vitamins: Secondary | ICD-10-CM

## 2023-10-14 DIAGNOSIS — K2970 Gastritis, unspecified, without bleeding: Secondary | ICD-10-CM | POA: Diagnosis present

## 2023-10-14 HISTORY — DX: Gastric intestinal metaplasia without dysplasia, unspecified site: K31.A19

## 2023-10-14 HISTORY — DX: Personal history of adenomatous and serrated colon polyps: Z86.0101

## 2023-10-14 MED ORDER — SODIUM CHLORIDE 0.9 % IV SOLN: 500.0000 mL | INTRAVENOUS | Status: DC

## 2023-10-14 NOTE — Op Note (Signed)
[facility] Patient Name: Frederick Robinson Procedure Date: 10/14/2023 8:00 AM MRN: [id number] Endoscopist: Iva Boop , MD, [id number] Age: 62 Referring MD:  Date of Birth: [birth year] Gender: Male Account #: [id number] Procedure:                Colonoscopy Indications:              Iron deficiency anemia, Weight loss Medicines:                Monitored Anesthesia Care Procedure:                Pre-Anesthesia Assessment:                           - Prior to the procedure, a History and Physical                            was performed, and patient medications and                            allergies were reviewed. The patient's tolerance of                            previous anesthesia was also reviewed. The risks                            and benefits of the procedure and the sedation                            options and risks were discussed with the patient.                            All questions were answered, and informed consent                            was obtained. Prior Anticoagulants: The patient has                            taken no anticoagulant or antiplatelet agents. ASA                            Grade Assessment: II - A patient with mild systemic                            disease. After reviewing the risks and benefits,                            the patient was deemed in satisfactory condition to                            undergo the procedure.                           After obtaining informed consent, the colonoscope  was passed under direct vision. Throughout the                            procedure, the patient's blood pressure, pulse, and                            oxygen saturations were monitored continuously. The                            Olympus Scope SN: [id number] was introduced through                            the anus and advanced to the the terminal ileum,                            with identification of the  appendiceal orifice and                            IC valve. The colonoscopy was performed without                            difficulty. The patient tolerated the procedure                            well. The quality of the bowel preparation was                            good. The terminal ileum, ileocecal valve,                            appendiceal orifice, and rectum were photographed.                            The bowel preparation used was Miralax via split                            dose instruction. Scope In: 8:26:49 AM Scope Out: 8:46:54 AM Scope Withdrawal Time: 0 hours 17 minutes 15 seconds  Total Procedure Duration: 0 hours 20 minutes 5 seconds  Findings:                 The perianal and digital rectal examinations were                            normal.                           Six sessile and semi-pedunculated polyps were found                            in the sigmoid colon, descending colon, transverse                            colon and ascending colon. The polyps were 2 to 8  mm in size. These polyps were removed with a cold                            snare. Resection and retrieval were complete.                            Verification of patient identification for the                            specimen was done. Estimated blood loss was minimal.                           The exam was otherwise without abnormality on                            direct and retroflexion views.                           The terminal ileum appeared normal. Complications:            No immediate complications. Estimated Blood Loss:     Estimated blood loss was minimal. Impression:               - Six 2 to 8 mm polyps in the sigmoid colon, in the                            descending colon, in the transverse colon and in                            the ascending colon, removed with a cold snare.                            Resected and retrieved.                            - The examination was otherwise normal on direct                            and retroflexion views.                           - The examined portion of the ileum was normal. Recommendation:           - Patient has a contact number available for                            emergencies. The signs and symptoms of potential                            delayed complications were discussed with the                            patient. Return to normal activities tomorrow.  Written discharge instructions were provided to the                            patient.                           - Resume previous diet.                           - Continue present medications.                           - Repeat colonoscopy is recommended. The                            colonoscopy date will be determined after pathology                            results from today's exam become available for                            review. Iva Boop, MD 10/14/2023 9:04:06 AM This report has been signed electronically.

## 2023-10-14 NOTE — Progress Notes (Signed)
Called to room to assist during endoscopic procedure.  Patient ID and intended procedure confirmed with present staff. Received instructions for my participation in the procedure from the performing physician.  

## 2023-10-14 NOTE — Progress Notes (Signed)
0749 Robinul 0.1 mg IV given due large amount of secretions upon assessment.  MD made aware, vss

## 2023-10-14 NOTE — Patient Instructions (Addendum)
There is gastritis seen - I suspect this is cause of the B12 deficiency. I took biopsies of stomach and duodenum to evaluate. I found and removed 7 small polyps from the colon.  I did not see anything that looks like cancer.  I will get results and let you know recommendations.  I appreciate the opportunity to care for you. Iva Boop, MD, [facility]  Await pathology results.  Handouts on gastritis and polyps provided.  YOU HAD AN ENDOSCOPIC PROCEDURE TODAY AT THE [facility]:   Refer to the procedure report that was given to you for any specific questions about what was found during the examination.  If the procedure report does not answer your questions, please call your gastroenterologist to clarify.  If you requested that your care partner not be given the details of your procedure findings, then the procedure report has been included in a sealed envelope for you to review at your convenience later.  YOU SHOULD EXPECT: Some feelings of bloating in the abdomen. Passage of more gas than usual.  Walking can help get rid of the air that was put into your GI tract during the procedure and reduce the bloating. If you had a lower endoscopy (such as a colonoscopy or flexible sigmoidoscopy) you may notice spotting of blood in your stool or on the toilet paper. If you underwent a bowel prep for your procedure, you may not have a normal bowel movement for a few days.  Please Note:  You might notice some irritation and congestion in your nose or some drainage.  This is from the oxygen used during your procedure.  There is no need for concern and it should clear up in a day or so.  SYMPTOMS TO REPORT IMMEDIATELY:  Following lower endoscopy (colonoscopy or flexible sigmoidoscopy):  Excessive amounts of blood in the stool  Significant tenderness or worsening of abdominal pains  Swelling of the abdomen that is new, acute  Fever of 100F or higher  Following upper endoscopy  (EGD)  Vomiting of blood or coffee ground material  New chest pain or pain under the shoulder blades  Painful or persistently difficult swallowing  New shortness of breath  Fever of 100F or higher  Black, tarry-looking stools  For urgent or emergent issues, a gastroenterologist can be reached at any hour by calling [phone]. Do not use MyChart messaging for urgent concerns.    DIET:  We do recommend a small meal at first, but then you may proceed to your regular diet.  Drink plenty of fluids but you should avoid alcoholic beverages for 24 hours.  ACTIVITY:  You should plan to take it easy for the rest of today and you should NOT DRIVE or use heavy machinery until tomorrow (because of the sedation medicines used during the test).    FOLLOW UP: Our staff will call the number listed on your records the next business day following your procedure.  We will call around 7:15- 8:00 am to check on you and address any questions or concerns that you may have regarding the information given to you following your procedure. If we do not reach you, we will leave a message.     If any biopsies were taken you will be contacted by phone or by letter within the next 1-3 weeks.  Please call us at [phone] if you have not heard about the biopsies in 3 weeks.    SIGNATURES/CONFIDENTIALITY: You and/or your care partner have signed paperwork  which will be entered into your electronic medical record.  These signatures attest to the fact that that the information above on your After Visit Summary has been reviewed and is understood.  Full responsibility of the confidentiality of this discharge information lies with you and/or your care-partner.

## 2023-10-14 NOTE — Op Note (Signed)
[facility] Patient Name: Azure Mirante Procedure Date: 10/14/2023 8:01 AM MRN: [id number] Endoscopist: Iva Boop , MD, [id number] Age: 62 Referring MD:  Date of Birth: 1961/07/23 Gender: Male Account #: [id number] Procedure:                Upper GI endoscopy Indications:              Iron deficiency anemia, Weight loss, B12 deficiency Medicines:                Monitored Anesthesia Care Procedure:                Pre-Anesthesia Assessment:                           - Prior to the procedure, a History and Physical                            was performed, and patient medications and                            allergies were reviewed. The patient's tolerance of                            previous anesthesia was also reviewed. The risks                            and benefits of the procedure and the sedation                            options and risks were discussed with the patient.                            All questions were answered, and informed consent                            was obtained. Prior Anticoagulants: The patient has                            taken no anticoagulant or antiplatelet agents. ASA                            Grade Assessment: II - A patient with mild systemic                            disease. After reviewing the risks and benefits,                            the patient was deemed in satisfactory condition to                            undergo the procedure.                           After obtaining informed consent, the endoscope was  passed under direct vision. Throughout the                            procedure, the patient's blood pressure, pulse, and                            oxygen saturations were monitored continuously. The                            Olympus scope [id number] was introduced through the                            mouth, and advanced to the second part of duodenum.                             The upper GI endoscopy was accomplished without                            difficulty. The patient tolerated the procedure                            well. Scope In: Scope Out: Findings:                 Diffuse moderate inflammation characterized by                            congestion (edema) and erythema and some body                            atrophy was found in the entire examined stomach.                            Biopsies were taken with a cold forceps for                            histology. Verification of patient identification                            for the specimen was done. Estimated blood loss was                            minimal.                           The exam was otherwise without abnormality.                           The cardia and gastric fundus were normal on                            retroflexion.                           Biopsies for histology were taken with a cold  forceps in the entire duodenum for evaluation of                            celiac disease. Verification of patient                            identification for the specimen was done. Estimated                            blood loss was minimal. Complications:            No immediate complications. Estimated Blood Loss:     Estimated blood loss was minimal. Impression:               - Gastritis. Biopsied. Sydney protocol used antrunm                            and body in separate jars                           - The examination was otherwise normal.                           - Biopsies were taken with a cold forceps for                            evaluation of celiac disease. Recommendation:           - See the other procedure note for documentation of                            additional recommendations. colonoscopy next                           - Await pathology results. Iva Boop, MD 10/14/2023 8:59:14 AM This report has been signed  electronically.

## 2023-10-14 NOTE — Progress Notes (Signed)
Pt's states no medical or surgical changes since previsit or office visit. 

## 2023-10-14 NOTE — Progress Notes (Signed)
History and Physical Interval Note:  10/14/2023 8:06 AM  Frederick Robinson.  has presented today for endoscopic procedure(s), with the diagnosis of  Encounter Diagnosis  Name Primary?   Loss of weight Yes  .  The various methods of evaluation and treatment have been discussed with the patient and/or family. After consideration of risks, benefits and other options for treatment, the patient has consented to  the endoscopic procedure(s).   The patient's history has been reviewed, patient examined, no change in status, stable for endoscopic procedure(s).  I have reviewed the patient's chart and labs.  Questions were answered to the patient's satisfaction.     Iva Boop, MD, [facility]

## 2023-10-14 NOTE — Progress Notes (Signed)
Report given to PACU, vss 

## 2023-10-19 ENCOUNTER — Telehealth: Payer: Self-pay

## 2023-10-19 NOTE — Telephone Encounter (Signed)
Left message on follow up call. 

## 2023-10-19 NOTE — Telephone Encounter (Signed)
Rx already responded to 10/13/23

## 2023-10-21 ENCOUNTER — Ambulatory Visit (INDEPENDENT_AMBULATORY_CARE_PROVIDER_SITE_OTHER): Payer: Self-pay | Admitting: Internal Medicine

## 2023-10-21 ENCOUNTER — Encounter: Payer: Self-pay | Admitting: Internal Medicine

## 2023-10-21 VITALS — BP 148/64 | HR 80 | Temp 98.2°F | Ht 75.0 in | Wt 242.0 lb

## 2023-10-21 DIAGNOSIS — K2950 Unspecified chronic gastritis without bleeding: Secondary | ICD-10-CM

## 2023-10-21 DIAGNOSIS — D510 Vitamin B12 deficiency anemia due to intrinsic factor deficiency: Secondary | ICD-10-CM

## 2023-10-21 DIAGNOSIS — K635 Polyp of colon: Secondary | ICD-10-CM

## 2023-10-21 DIAGNOSIS — D519 Vitamin B12 deficiency anemia, unspecified: Secondary | ICD-10-CM

## 2023-10-21 DIAGNOSIS — N1831 Chronic kidney disease, stage 3a: Secondary | ICD-10-CM | POA: Diagnosis not present

## 2023-10-21 DIAGNOSIS — K2970 Gastritis, unspecified, without bleeding: Secondary | ICD-10-CM | POA: Insufficient documentation

## 2023-10-21 DIAGNOSIS — E611 Iron deficiency: Secondary | ICD-10-CM

## 2023-10-21 MED ORDER — NEEDLES & SYRINGES MISC: 1.0000 mL | 0 refills | Status: DC

## 2023-10-21 NOTE — Assessment & Plan Note (Signed)
Colon Polyps (K63.5)  Six polyps removed (2-22mm) Awaiting pathology results Follow-up interval to be determined based on pathology Will require ongoing surveillance

## 2023-10-21 NOTE — Assessment & Plan Note (Signed)
Gastritis with Gastroesophageal Junction Inflammation (K29.70)  EGD shows diffuse moderate inflammation with erythema Severe inflammation at GE junction Minimal nocturnal symptoms Plan:  Ordered gastrin level and H. pylori testing Defer acid suppression pending test results Await biopsy results GI follow-up to be scheduled based on results

## 2023-10-21 NOTE — Patient Instructions (Signed)
After Visit Summary Instructions  Important Tests to Complete: 1. Blood test for gastrin level - Complete today at lab 2. Stool test for H. pylori - Pick up kit from lab today  Medication Instructions: - Start B12 injections every two weeks - Inject into arm or thigh muscle (not belly) - Continue taking oral B12 supplements - Continue iron supplements every other day - Continue all other medications as prescribed  Self-Care Instructions: 1. Monitor for and report:    - Increased heartburn symptoms    - Difficulty swallowing    - Black or tarry stools    - Unexplained weight loss     2. Dietary Recommendations:    - Use cast iron cookware when possible    - Stay well hydrated (minimum 5 eight-ounce glasses daily)    - Monitor blood sugar regularly  3. Kidney Protection:    - Maintain good hydration    - Seek early medical attention for illness with vomiting/diarrhea    - Continue blood sugar monitoring  Follow-up Appointments: 1. Endocrinology - February 2025 2. Primary Care - December 12, 2023 (may be adjusted based on test results)  Contact office if: - Severe stomach pain develops - Unable to keep liquids down - Fever develops - Any concerning symptoms  Your test results and biopsy results will be reviewed when available. We will contact you with results and any needed changes to your care plan.

## 2023-10-21 NOTE — Progress Notes (Signed)
[facility]: [phone]   -- Medical Office Visit --  Patient:  Frederick Robinson.      Age: 62 y.o.       Sex:  male  Date:   10/21/2023 Today's Healthcare Provider: Lula Olszewski, MD  ==========================================================================    Pre-visit plan: ### Next Visit Focus: - Review comprehensive ultrasound findings:   * Fatty liver changes and elastography results   * Evaluate causes of mild splenomegaly (13.9 cm)   * Discuss lifestyle modifications for liver health - Assess pernicious anemia management:   * B12 injection tolerability and effectiveness   * Review latest B12 levels and symptoms   * Discuss long-term management plan - Evaluate kidney and cardiovascular status:   * Review BP trends (especially morning readings)   * Assess electrolyte balance   * Discuss triglyceride management strategies  ### HM Measures: - HbA1c (last: 6.2% on 05/29/2023) - Lipid panel - eGFR - Liver function tests - B12 levels - Complete blood count (monitor anemia)  ### Additional Tests to Consider: - 24-hour ambulatory BP monitoring - Additional workup for splenomegaly if warranted - Repeat elastography in one year  ### Care Gaps: - Pending GI evaluation for:   * Pernicious anemia   * Fatty liver management   * Splenomegaly workup - Need to establish timeline for follow-up imaging - Urology follow-up for BPH if symptoms worsen  ### Follow-up Items: - Review gastroenterology consult notes - Assess need for medication adjustments:   * Antihypertensives   * Lipid-lowering agents   * B12 supplementation - Monitor spleen size trend  ### Up-to-Date Guidelines: 1. NAFLD: Elastography <5 kPa suggests low risk of significant fibrosis 2. Pernicious anemia: Screen for gastric cancer and autoimmune thyroid disease 3. CKD management: Target BP <130/80 mmHg in diabetic CKD 4. Consider additional workup for mild  splenomegaly  ### Personal Reminders: - Verify alcohol reduction progress (previously 16 drinks/week) - Review fall prevention given multiple comorbidities - Brother died of prostate cancer - maintain screening - Reports caring for toddler - discuss ergonomics - Previously had bad experience with [facility]   Assessment & Plan Anemia due to vitamin B12 deficiency, unspecified B12 deficiency type Pernicious Anemia with B12 Deficiency (D51.0)  B12 level 316 with improved hemoglobin from 11.4 to 13.0 Failed trial of sublingual B12 supplementation although I think it helps some Plan: Initiate B12 injections every two weeks Prescription provided for syringes and needles Continue current OTC oral B12 supplementation Monitor response with repeat B12 levels Pernicious anemia  Stage 3a chronic kidney disease (HCC) Chronic Kidney Disease Stage 2 (N18.2)  Current eGFR 60 Improved glycemic control (A1c 6.2) Plan:  Monitor renal function q3 months Maintain good hydration Continue strict glycemic control Early intervention for acute illness Polyp of colon, unspecified part of colon, unspecified type Colon Polyps (K63.5)  Six polyps removed (2-10mm) Awaiting pathology results Follow-up interval to be determined based on pathology Will require ongoing surveillance Chronic gastritis without bleeding, unspecified gastritis type Gastritis with Gastroesophageal Junction Inflammation (K29.70)  EGD shows diffuse moderate inflammation with erythema Severe inflammation at GE junction Minimal nocturnal symptoms Plan:  Ordered gastrin level and H. pylori testing Defer acid suppression pending test results Await biopsy results GI follow-up to be scheduled based on results Iron deficiency Iron Deficiency (E61.1)  Low ferritin with adequate serum iron Plan:  OTC iron supplements every other day Dietary modifications including cast iron cookware Monitor with routine labs  Orders Placed  During this Encounter:   ED Discharge Orders          Ordered    Needles & Syringes MISC  Weekly       Note to Pharmacy: Also please provide blunts to draw up the injection if you can't prefill syringes.   10/21/23 1122    Gastrin        10/21/23 1142    H. pylori antigen, stool        10/21/23 1142          Diagnoses and all orders for this visit: Anemia due to vitamin B12 deficiency, unspecified B12 deficiency type -     Needles & Syringes MISC; Inject 1 mL into the muscle once a week. Pernicious anemia -     Needles & Syringes MISC; Inject 1 mL into the muscle once a week. Stage 3a chronic kidney disease (HCC) Polyp of colon, unspecified part of colon, unspecified type Chronic gastritis without bleeding, unspecified gastritis type -     Gastrin -     H. pylori antigen, stool Iron deficiency  Follow-up Plan:  Scheduled endocrinology appointment February 2025 Maintain January 25th follow-up pending test results Lab monitoring:  Gastrin level H. pylori stool test Q3 month renal function Periodic B12 levels Future Appointments  Date Time Provider Department Center  10/26/2023  1:00 PM Tessa Lerner, DO [id number]  12/11/2023  2:20 PM Lula Olszewski, MD [facility]  Patient Care Team: Lula Olszewski, MD as PCP - General (Internal Medicine) Dorisann Frames, MD as Referring Physician (Endocrinology) Malva Cogan, MD as Referring Physician (Dermatology)    SUBJECTIVE: 62 y.o. male who has Hypothyroidism; Diabetes mellitus, type 2 (HCC); Hyperlipidemia associated with type 2 diabetes mellitus (HCC); Resistant hypertension; Allergic rhinitis; Former smoker; Leg injury; Family history of prostate cancer; Apathetic; CKD (chronic kidney disease); Peroneal tendon rupture, left, sequela; Pain of joint of left ankle and foot; Acquired cavovarus deformity of left foot; History of melanoma; Tear of medial meniscus of knee; Tendon rupture, post-op; Snoring;  Memory loss; Macrocytic anemia; Vitamin D deficiency; Low serum parathyroid hormone (PTH); Foot lesion; B12 deficiency anemia; Hepatic steatosis; BPH (benign prostatic hyperplasia); Secondary hypertension; DM (diabetes mellitus), type 2 with neurological complications (HCC); GERD without esophagitis; Pernicious anemia; Colon polyps; and Gastritis on their problem list.  Main reasons for visit/main concerns/chief complaint: Follow-up    AI-Extracted: Discussed the use of AI scribe software for clinical note transcription with the patient, who gave verbal consent to proceed.  History of Present Illness  The patient is a 62 year old male with multiple chronic conditions who presents for follow-up after recent endoscopic procedures. His chief medical issues include pernicious anemia, B12 deficiency, gastritis, and chronic kidney disease.  Recent Endoscopic Findings: An upper endoscopy revealed diffuse moderate gastric inflammation with swelling and erythema throughout the stomach, most pronounced at the gastroesophageal junction. Colonoscopy showed six polyps ranging from 2-58mm, all removed for pathology. This represents a change from his clean colonoscopy ten years ago.  B12 Deficiency/Pernicious Anemia: Current B12 level is 316, with hemoglobin improved from 11.4 to 13.0. Sublingual B12 supplementation has proven insufficient. The patient has previous experience with self-administered injections from prior insulin management.  Gastrointestinal Symptoms: Reports minimal heartburn symptoms, primarily nocturnal, previously managed with frequent Tums use. Denies significant reflux or regurgitation. No dysphagia or odynophagia reported.  Kidney Function: eGFR fluctuates around 60, indicating Stage 2-3 CKD. Blood sugar control has improved with A1c decreasing from 8.1 to 6.2. Earlier  this year, experienced COVID-19 followed by a severe gastrointestinal illness with vomiting and diarrhea, which  temporarily affected medication compliance and glycemic control.  Current Status: Awaiting pathology results from recent biopsies. Additional workup planned including gastrin level and H. pylori testing to determine the underlying cause of gastritis and guide treatment decisions.   Note that patient  has a past medical history of Arthritis (01/30/2023), B12 deficiency (07/27/2023), Coronary artery calcification, Diabetes mellitus without complication (HCC), Hyperlipidemia, Hypertension, Melanoma (HCC), and Thyroid disease.  Problem list overviews that were updated at today's visit: Problem  Colon Polyps   10/14/23- The perianal and digital rectal examinations were normal. - Six sessile and semi- pedunculated polyps were found in the sigmoid colon, descending colon, transverse colon and ascending colon. The polyps were 2 to 8 mm in size. These polyps were removed with a cold snare. Resection and retrieval were complete. Verification of patient identification for the specimen was done. Estimated blood loss was minimal. - The exam was otherwise without abnormality   Gastritis   - Diffuse moderate inflammation characterized by congestion ( edema) and erythema and some body atrophy was found in the entire examined stomach. Biopsies were taken with a cold forceps for histology. Verification of patient identification for the specimen was done. Estimated blood loss was minimal. - The exam was otherwise without abnormality. - The cardia and gastric fundus were normal on retroflexion.   Ckd (Chronic Kidney Disease)   08/17/2023: eGFR stable at 65 mL/min/1.19m2. Creatinine 1.24 mg/dL (previously 1.61 mg/dL). Mild electrolyte imbalances: Na 145 mmol/L (H), Cl 109 mmol/L (H). Continue current management. Adjust medications as needed. Monitor electrolytes closely.  Chronic Kidney Disease, Stage 2 -3a(ICD-10: N18.2) EGFR improved to 79 mL/min. Albumin/Creatinine Ratio elevated at 56 (H), suggesting persistent  kidney damage. Consider adding SGLT2 inhibitor for renoprotection, discussing previous side effects. Adjust BP target to <130/80 mmHg. Nephrology referral recommended.  Nephrologist: No care team member to display  Medicines:     ARB/ACEI:  losartan 100    SGLT2 inhibitor:   history of bad experience with [facility], we will discuss with endo and nephrology and then make decision about restart  Imaging:  Renal artery duplex  03/20/2022:  No evidence of renal artery occlusive disease in either renal artery. Mild  increase in bilateral resistivity index and suggests medico-renal disease.   Renal length is within normal limits for both kidneys.  Normal abdominal aorta flow velocities noted.   Labwork: Lab Results  Component Value Date   BUN 20 10/06/2023   BUN 13 08/17/2023   BUN 25 (H) 07/06/2023   BUN 28 (H) 05/28/2023   BUN 30 (H) 05/08/2023    Creatinine, Ser (mg/dL)  Date Value  09/60/4540 1.30  08/17/2023 1.24  07/06/2023 1.22  05/28/2023 1.35 (H)  05/08/2023 2.02 (H)  10/16/2022 1.29 (H)  08/26/2022 1.52 (H)  03/20/2022 0.98  02/15/2018 1.27  08/31/2015 1.22  08/28/2015 1.18  10/29/2009 1.0  12/13/2008 1.1  08/14/2008 1.0  11/24/2007 1.4  09/16/2007 1.2  05/13/2007 1.0   GFR (mL/min)  Date Value  10/06/2023 58.44 (L)  07/06/2023 63.18   eGFR (mL/min/1.73)  Date Value  08/17/2023 65  05/28/2023 59 (L)  10/16/2022 63  03/20/2022 87   EGFR (no units)  Date Value  08/18/2023 65.0    Lab Results  Component Value Date   VD25OH 14.89 (L) 07/06/2023   NA 140 10/06/2023   K 4.6 10/06/2023   CL 107 10/06/2023   CO2 25 10/06/2023  CALCIUM 9.8 10/06/2023   PROT 7.3 10/06/2023   ALBUMIN 4.5 10/06/2023   HGB 13.0 10/06/2023   HCT 39.1 10/06/2023   PTH 22 08/17/2023   PSA 0.54 07/06/2023   ESRSEDRATE 8 05/08/2023   HGBA1C 6.6 02/15/2018   Lab Results  Component Value Date   COLORURINE YELLOW 07/06/2023   APPEARANCEUR CLEAR 07/06/2023   LABSPEC  1.015 07/06/2023   PHURINE 7.0 07/06/2023   GLUCOSEU NEGATIVE 07/06/2023   HGBUR NEGATIVE 07/06/2023   BILIRUBINUR NEGATIVE 07/06/2023   KETONESUR NEGATIVE 07/06/2023   UROBILINOGEN 1.0 07/06/2023   NITRITE NEGATIVE 07/06/2023   LEUKOCYTESUR NEGATIVE 07/06/2023   MICROALBUR <0.7 07/06/2023   Lab Results  Component Value Date   HGBA1C 6.6 02/15/2018    Symptoms to Watch For: changes in urination, swelling, fatigue, chest pain, or shortness of breath  Medicines to Avoid:  diuretics (fluid pills), NSAIDs, proton pump inhibitor (PPI) stomach acid reducer, contrasted imaging studies, aminoglycosides, acyclovir, phosphate-based bowel prep, baclofen  Lifestyle and Nutrition:  Limit sodium, potassium, phosphorus and protein intake, consider nutrition consult, smoking avoidance, exercise regularly, manage weight, and reduce stress, control blood sugar, avoid dehydration (may need early emergency room visits for nausea vomiting diarrhea)  Vaccinations: flu/COVID/pneumonia/hepatitis B vaccinations are more important in kidney disease       Med reconciliation: Current Outpatient Medications on File Prior to Visit  Medication Sig   amLODipine (NORVASC) 10 MG tablet Take 1 tablet (10 mg total) by mouth daily at 10 pm.   aspirin 81 MG tablet Take 81 mg by mouth daily.   B-D UF III MINI PEN NEEDLES 31G X 5 MM MISC USE AS DIRECTED   carvedilol (COREG) 12.5 MG tablet Take 1 tablet (12.5 mg total) by mouth 2 (two) times daily with a meal.   Cobalamin Combinations (B-12) 1000-400 MCG SUBL Place 1 tablet under the tongue daily.   Continuous Glucose Sensor (DEXCOM G6 SENSOR) MISC USE AS DIRECTED. NEED TO REPLACE EVERY 10 DAYS.   Continuous Glucose Transmitter (DEXCOM G6 TRANSMITTER) MISC USE AS DIRECTED   dapagliflozin propanediol (FARXIGA) 10 MG TABS tablet Take by mouth daily.   diclofenac Sodium (VOLTAREN) 1 % GEL USE AS DIRECTED   Evolocumab (REPATHA SURECLICK) 140 MG/ML SOAJ INJECT 140 MG INTO  THE SKIN EVERY 14 (FOURTEEN) DAYS.   fenofibrate micronized (LOFIBRA) 200 MG capsule daily before breakfast.   Finerenone (KERENDIA) 10 MG TABS Take 1 tablet (10 mg total) by mouth daily.   icosapent Ethyl (VASCEPA) 1 g capsule Take 2 capsules (2 g total) by mouth 2 (two) times daily.   isosorbide-hydrALAZINE (BIDIL) 20-37.5 MG tablet Take 1 tablet by mouth 3 (three) times daily.   levothyroxine (SYNTHROID) 112 MCG tablet Take 224 mcg by mouth daily.   loratadine (CLARITIN) 10 MG tablet Take 10 mg by mouth daily.   rosuvastatin (CRESTOR) 40 MG tablet Take 40 mg by mouth daily.   sertraline (ZOLOFT) 50 MG tablet Take 1 tablet (50 mg total) by mouth daily.   TRESIBA FLEXTOUCH 200 UNIT/ML FlexTouch Pen Inject 50 Units into the skin in the morning and at bedtime.   losartan (COZAAR) 100 MG tablet Take 1 tablet (100 mg total) by mouth daily at 10 pm.   No current facility-administered medications on file prior to visit.  There are no discontinued medications.   Objective   Physical Exam     10/21/2023   11:07 AM 10/21/2023   11:02 AM 10/14/2023    9:09 AM  Vitals with BMI  Height  6\' 3"    Weight  242 lbs   BMI  30.25   Systolic 148 152 161  Diastolic 64 60 75  Pulse  80 62   Wt Readings from Last 10 Encounters:  10/21/23 242 lb (109.8 kg)  10/14/23 243 lb (110.2 kg)  10/06/23 243 lb 6.4 oz (110.4 kg)  09/25/23 235 lb 6.1 oz (106.8 kg)  08/20/23 242 lb 3.2 oz (109.9 kg)  08/18/23 238 lb (108 kg)  06/22/23 240 lb (108.9 kg)  06/11/23 236 lb 12.4 oz (107.4 kg)  05/08/23 235 lb (106.6 kg)  04/07/23 243 lb 6.4 oz (110.4 kg)   Vital signs reviewed.  Nursing notes reviewed. Weight trend reviewed. Abnormalities and Problem-Specific physical exam findings:  truncal adiposity   General Appearance:  No acute distress appreciable.   Well-groomed, healthy-appearing male.  Well proportioned with no abnormal fat distribution.  Good muscle tone. Pulmonary:  Normal work of breathing at rest, no  respiratory distress apparent. SpO2: 97 %  Musculoskeletal: All extremities are intact.  Neurological:  Awake, alert, oriented, and engaged.  No obvious focal neurological deficits or cognitive impairments.  Sensorium seems unclouded.   Speech is clear and coherent with logical content. Psychiatric:  Appropriate mood, pleasant and cooperative demeanor, thoughtful and engaged during the exam  Results            No results found for any visits on 10/21/23.  Appointment on 10/06/2023  Component Date Value   Vitamin B-12 10/06/2023 194 (L)    Iron 10/06/2023 115    Transferrin 10/06/2023 345.0    Saturation Ratios 10/06/2023 23.8    Ferritin 10/06/2023 16.2 (L)    TIBC 10/06/2023 483.0 (H)    Sodium 10/06/2023 140    Potassium 10/06/2023 4.6    Chloride 10/06/2023 107    CO2 10/06/2023 25    Glucose, Bld 10/06/2023 110 (H)    BUN 10/06/2023 20    Creatinine, Ser 10/06/2023 1.30    Total Bilirubin 10/06/2023 0.4    Alkaline Phosphatase 10/06/2023 48    AST 10/06/2023 22    ALT 10/06/2023 17    Total Protein 10/06/2023 7.3    Albumin 10/06/2023 4.5    GFR 10/06/2023 58.44 (L)    Calcium 10/06/2023 9.8    WBC 10/06/2023 5.9    RBC 10/06/2023 4.31    Hemoglobin 10/06/2023 13.0    HCT 10/06/2023 39.1    MCV 10/06/2023 90.5    MCHC 10/06/2023 33.3    RDW 10/06/2023 13.4    Platelets 10/06/2023 235.0    Neutrophils Relative % 10/06/2023 45.8    Lymphocytes Relative 10/06/2023 40.6    Monocytes Relative 10/06/2023 8.2    Eosinophils Relative 10/06/2023 4.3    Basophils Relative 10/06/2023 1.1    Neutro Abs 10/06/2023 2.7    Lymphs Abs 10/06/2023 2.4    Monocytes Absolute 10/06/2023 0.5    Eosinophils Absolute 10/06/2023 0.3    Basophils Absolute 10/06/2023 0.1   Lab on 08/17/2023  Component Date Value   PTH 08/17/2023 22    Calcium 08/17/2023 9.6    Gastrin 08/17/2023 25    Intrinsic Factor 08/17/2023 Positive (A)    LDL Direct 08/17/2023 73    Glucose 08/17/2023 78     BUN 08/17/2023 13    Creatinine, Ser 08/17/2023 1.24    eGFR 08/17/2023 65    BUN/Creatinine Ratio 08/17/2023 10    Sodium 08/17/2023 145 (H)    Potassium 08/17/2023 4.6    Chloride 08/17/2023  109 (H)    CO2 08/17/2023 22    Calcium 08/17/2023 9.6    Total Protein 08/17/2023 7.2    Albumin 08/17/2023 4.5    Globulin, Total 08/17/2023 2.7    Bilirubin Total 08/17/2023 0.3    Alkaline Phosphatase 08/17/2023 56    AST 08/17/2023 31    ALT 08/17/2023 25    Cholesterol, Total 08/17/2023 164    Triglycerides 08/17/2023 201 (H)    HDL 08/17/2023 50    VLDL Cholesterol Cal 08/17/2023 34    LDL Chol Calc (NIH) 08/17/2023 80    LDL CALC COMMENT: 08/17/2023 Comment    LDL/HDL Ratio 08/17/2023 1.6    Vitamin B-12 08/17/2023 316    Folate 08/17/2023 14.8   Orders Only on 07/29/2023  Component Date Value   EGFR 08/18/2023 65.0   Scanned Document on 07/27/2023  Component Date Value   A1c 05/29/2023 6.2    EGFR (Non-African Amer.) 05/29/2023 79    Creatinine, POC 05/29/2023 160.9    Albumin, Urine POC 05/29/2023 90.8    Albumin/Creatinine Ratio* 05/29/2023 56H   Scanned Document on 07/13/2023  Component Date Value   HM Hepatitis Screen 07/07/2023 Negative-Validated   Office Visit on 07/09/2023  Component Date Value   Path Review 07/09/2023     Vitamin B-12 07/09/2023 52 (L)    Folate 07/09/2023 >24.2    Methylmalonic Acid, Quant 07/09/2023 1,228 (H)   Lab on 07/06/2023  Component Date Value   VITD 07/06/2023 14.89 (L)    Color, Urine 07/06/2023 YELLOW    APPearance 07/06/2023 CLEAR    Specific Gravity, Urine 07/06/2023 1.015    pH 07/06/2023 7.0    Total Protein, Urine 07/06/2023 NEGATIVE    Urine Glucose 07/06/2023 NEGATIVE    Ketones, ur 07/06/2023 NEGATIVE    Bilirubin Urine 07/06/2023 NEGATIVE    Hgb urine dipstick 07/06/2023 NEGATIVE    Urobilinogen, UA 07/06/2023 1.0    Leukocytes,Ua 07/06/2023 NEGATIVE    Nitrite 07/06/2023 NEGATIVE    WBC, UA 07/06/2023 0-2/hpf     RBC / HPF 07/06/2023 none seen    PTH 07/06/2023 15 (L)    PSA 07/06/2023 0.54    Creatinine, Urine 07/06/2023 CANCELED    Phosphorus 07/06/2023 3.5    Microalb, Ur 07/06/2023 <0.7    Creatinine,U 07/06/2023 80.6    Microalb Creat Ratio 07/06/2023 0.9    Iron 07/06/2023 75    TIBC 07/06/2023 377    %SAT 07/06/2023 20    Ferritin 07/06/2023 44    HCV Quant Baseline 07/06/2023 HCV Not Detected    HCV log10 07/06/2023 CANCELED    Test Information 07/06/2023 Comment    HCV Genotype 07/06/2023 CANCELED    WBC 07/06/2023 4.9    RBC 07/06/2023 3.68 (L)    Hemoglobin 07/06/2023 11.4 (L)    HCT 07/06/2023 34.7 (L)    MCV 07/06/2023 94.1    MCHC 07/06/2023 33.0    RDW 07/06/2023 13.6    Platelets 07/06/2023 209.0    Neutrophils Relative % 07/06/2023 57.3    Lymphocytes Relative 07/06/2023 28.4    Monocytes Relative 07/06/2023 6.8    Eosinophils Relative 07/06/2023 6.6 (H)    Basophils Relative 07/06/2023 0.9    Neutro Abs 07/06/2023 2.8    Lymphs Abs 07/06/2023 1.4    Monocytes Absolute 07/06/2023 0.3    Eosinophils Absolute 07/06/2023 0.3    Basophils Absolute 07/06/2023 0.0    Sodium 07/06/2023 140    Potassium 07/06/2023 4.5    Chloride 07/06/2023 109  CO2 07/06/2023 22    Glucose, Bld 07/06/2023 145 (H)    BUN 07/06/2023 25 (H)    Creatinine, Ser 07/06/2023 1.22    GFR 07/06/2023 63.18    Calcium 07/06/2023 9.4   Admission on 06/11/2023, Discharged on 06/11/2023  Component Date Value   Glucose-Capillary 06/11/2023 110 (H)    Glucose-Capillary 06/11/2023 101 (H)   Orders Only on 05/26/2023  Component Date Value   Glucose 05/28/2023 77    BUN 05/28/2023 28 (H)    Creatinine, Ser 05/28/2023 1.35 (H)    eGFR 05/28/2023 59 (L)    BUN/Creatinine Ratio 05/28/2023 21    Sodium 05/28/2023 144    Potassium 05/28/2023 4.8    Chloride 05/28/2023 105    CO2 05/28/2023 24    Calcium 05/28/2023 10.5 (H)    Total Protein 05/28/2023 6.9    Albumin 05/28/2023 4.6     Globulin, Total 05/28/2023 2.3    Bilirubin Total 05/28/2023 0.2    Alkaline Phosphatase 05/28/2023 38 (L)    AST 05/28/2023 25    ALT 05/28/2023 20    LDL Direct 05/28/2023 68    Cholesterol, Total 05/28/2023 161    Triglycerides 05/28/2023 210 (H)    HDL 05/28/2023 48    VLDL Cholesterol Cal 05/28/2023 35    LDL Chol Calc (NIH) 05/28/2023 78    LDL CALC COMMENT: 05/28/2023 Comment    LDL/HDL Ratio 05/28/2023 1.6   Admission on 05/08/2023, Discharged on 05/08/2023  Component Date Value   WBC 05/08/2023 4.7    RBC 05/08/2023 3.89 (L)    Hemoglobin 05/08/2023 12.1 (L)    HCT 05/08/2023 35.7 (L)    MCV 05/08/2023 91.8    MCH 05/08/2023 31.1    MCHC 05/08/2023 33.9    RDW 05/08/2023 13.1    Platelets 05/08/2023 218    nRBC 05/08/2023 0.0    Neutrophils Relative % 05/08/2023 50    Neutro Abs 05/08/2023 2.4    Lymphocytes Relative 05/08/2023 34    Lymphs Abs 05/08/2023 1.6    Monocytes Relative 05/08/2023 8    Monocytes Absolute 05/08/2023 0.4    Eosinophils Relative 05/08/2023 6    Eosinophils Absolute 05/08/2023 0.3    Basophils Relative 05/08/2023 1    Basophils Absolute 05/08/2023 0.1    Immature Granulocytes 05/08/2023 1    Abs Immature Granulocytes 05/08/2023 0.03    Sodium 05/08/2023 140    Potassium 05/08/2023 4.5    Chloride 05/08/2023 108    CO2 05/08/2023 23    Glucose, Bld 05/08/2023 175 (H)    BUN 05/08/2023 30 (H)    Creatinine, Ser 05/08/2023 2.02 (H)    Calcium 05/08/2023 9.8    GFR, Estimated 05/08/2023 36 (L)    Anion gap 05/08/2023 9    Sed Rate 05/08/2023 8   There may be more visits with results that are not included.   No image results found.   US ABDOMEN COMPLETE W/ELASTOGRAPHY  Result Date: 09/18/2023 CLINICAL DATA:  Fatty liver. EXAM: ULTRASOUND ABDOMEN ULTRASOUND HEPATIC ELASTOGRAPHY TECHNIQUE: Sonography of the upper abdomen was performed. In addition, ultrasound elastography evaluation of the liver was performed. A region of interest was  placed within the right lobe of the liver. Following application of a compressive sonographic pulse, tissue compressibility was assessed. Multiple assessments were performed at the selected site. Median tissue compressibility was determined. Previously, hepatic stiffness was assessed by shear wave velocity. Based on recently published Society of Radiologists in Ultrasound consensus article, reporting is now recommended to  be performed in the SI units of pressure (kiloPascals) representing hepatic stiffness/elasticity. The obtained result is compared to the published reference standards. (cACLD = compensated Advanced Chronic Liver Disease) COMPARISON:  None Available. FINDINGS: ULTRASOUND ABDOMEN Gallbladder: No gallstones or wall thickening visualized. No sonographic Murphy sign noted by sonographer. Common bile duct: Diameter: 3 mm Liver: Heterogeneous echogenic hepatic parenchyma consistent with fatty liver infiltration. With this level of echogenicity evaluation for underlying mass lesion is limited and if needed follow-up contrast CT or MRI as clinically appropriate. Portal vein is patent on color Doppler imaging with normal direction of blood flow towards the liver. IVC: Obscured by overlapping bowel gas Pancreas: Obscured by overlapping bowel gas. Spleen: Enlarged measuring up to 13.9 cm. Right Kidney: Length: 12.9 cm. Echogenicity within normal limits. No mass or hydronephrosis visualized. Left Kidney: Length: 12.5 cm. Echogenicity within normal limits. No mass or hydronephrosis visualized. Abdominal aorta: No aneurysm visualized. Other findings: No ascites. ULTRASOUND HEPATIC ELASTOGRAPHY Device: Siemens Helix VTQ Patient position: Oblique Transducer 6C1 Number of measurements: 10 Hepatic segment:  8 Median kPa: 1.9 IQR: 0.6 IQR/Median kPa ratio: 0.3 Data quality: IQR/Median kPa ratio of 0.3 or greater indicates reduced accuracy Diagnostic category:  < or = 5 kPa: high probability of being normal The use of  hepatic elastography is applicable to patients with viral hepatitis and non-alcoholic fatty liver disease. At this time, there is insufficient data for the referenced cut-off values and use in other causes of liver disease, including alcoholic liver disease. Patients, however, may be assessed by elastography and serve as their own reference standard/baseline. In patients with non-alcoholic liver disease, the values suggesting compensated advanced chronic liver disease (cACLD) may be lower, and patients may need additional testing with elasticity results of 7-9 kPa. Please note that abnormal hepatic elasticity and shear wave velocities may also be identified in clinical settings other than with hepatic fibrosis, such as: acute hepatitis, elevated right heart and central venous pressures including use of beta blockers, veno-occlusive disease (Budd-Chiari), infiltrative processes such as mastocytosis/amyloidosis/infiltrative tumor/lymphoma, extrahepatic cholestasis, with hyperemia in the post-prandial state, and with liver transplantation. Correlation with patient history, laboratory data, and clinical condition recommended. Diagnostic Categories: < or =5 kPa: high probability of being normal < or =9 kPa: in the absence of other known clinical signs, rules out cACLD >9 kPa and ?13 kPa: suggestive of cACLD, but needs further testing >13 kPa: highly suggestive of cACLD > or =17 kPa: highly suggestive of cACLD with an increased probability of clinically significant portal hypertension IMPRESSION: ULTRASOUND ABDOMEN: Fatty liver infiltration.  Mild splenic enlargement. ULTRASOUND HEPATIC ELASTOGRAPHY: Median kPa:  1.9 Diagnostic category:  < or = 5 kPa: high probability of being normal Electronically Signed   By: Karen Kays M.D.   On: 09/18/2023 11:00   DG Chest 2 View  Result Date: 09/09/2023 CLINICAL DATA:  Cough, wheezing, and chest tightness for the past 2 weeks. EXAM: CHEST - 2 VIEW COMPARISON:  Chest x-ray  dated August 21, 2022. FINDINGS: The heart size and mediastinal contours are within normal limits. Normal pulmonary vascularity. No focal consolidation, pleural effusion, or pneumothorax. No acute osseous abnormality. IMPRESSION: No active cardiopulmonary disease. Electronically Signed   By: Obie Dredge M.D.   On: 09/09/2023 14:34   US RENAL  Result Date: 07/24/2023 CLINICAL DATA:  Chronic kidney disease EXAM: RENAL / URINARY TRACT ULTRASOUND COMPLETE COMPARISON:  None Available. Images made available for interpretation to me at 07/24/2023 at 4:42 p.m. FINDINGS: Right Kidney: Renal measurements: 12.4  x 6.2 x 6.3 cm = volume: 254 mL. Echogenicity within normal limits. No mass or hydronephrosis visualized. Left Kidney: Renal measurements: 12.4 x 7 x 6.9 cm = volume: 309 mL. Echogenicity within normal limits. No mass or hydronephrosis visualized. Bladder: Appears normal for degree of bladder distention. Other: Liver appears slightly echogenic.  Prostate is slightly enlarged. IMPRESSION: 1. Negative for hydronephrosis. Kidneys appear within normal limits. 2. Slightly echogenic liver suggesting hepatic steatosis and or hepatocellular disease. 3. Slightly enlarged prostate. Electronically Signed   By: Jasmine Pang M.D.   On: 07/24/2023 16:44    DG Chest 2 View  Result Date: 09/09/2023 CLINICAL DATA:  Cough, wheezing, and chest tightness for the past 2 weeks. EXAM: CHEST - 2 VIEW COMPARISON:  Chest x-ray dated August 21, 2022. FINDINGS: The heart size and mediastinal contours are within normal limits. Normal pulmonary vascularity. No focal consolidation, pleural effusion, or pneumothorax. No acute osseous abnormality. IMPRESSION: No active cardiopulmonary disease. Electronically Signed   By: Obie Dredge M.D.   On: 09/09/2023 14:34      Medical Decision Making Documentation  Level of Medical Decision Making: Moderate  1. Number and Complexity of Problems Addressed: - Multiple chronic illnesses with  progression requiring medication adjustment   * Pernicious anemia with failed oral therapy requiring transition to parenteral B12   * CKD stage 2-3 with fluctuating eGFR requiring monitoring adjustment   * Newly diagnosed gastritis pending pathology results   * Multiple colon polyps awaiting pathology   * Iron deficiency requiring management plan  2. Amount and/or Complexity of Data Reviewed and Analyzed: - Review and analysis of recent diagnostic tests:   * Independent interpretation of EGD findings showing gastric inflammation   * Review of colonoscopy results with multiple polyps   * Analysis of comprehensive labs including B12, CBC, iron studies, and renal function   * Review of A1c trend showing improvement from 8.1 to 6.2 - Additional workup ordered:   * Gastrin level   * H. pylori testing   * Pending pathology results from multiple biopsies  3. Risk of Complications and/or Morbidity: Moderate risk based on: - Management decisions requiring:   * Initiation of parenteral B12 therapy   * Coordination with GI specialist for gastritis management (sharing this record to Dr. Leone Payor)   * Monitoring plan for multiple chronic conditions   * Careful timing of therapeutic interventions pending test results - Risk factors including:   * Multiple comorbidities affecting treatment decisions   * Need to defer acid suppression pending diagnostic workup   * Potential for serious pathology requiring close follow-up  Medical Necessity Statement: Today's moderate level service was medically necessary due to: 1. Multiple chronic conditions requiring careful coordination of care 2. New findings on diagnostic studies requiring interpretation and management decisions 3. Need for therapeutic adjustments based on disease progression 4. Complex diagnostic workup requiring careful sequencing of tests and treatments   Additional Info: This encounter employed real-time, collaborative documentation.  The patient actively reviewed and updated their medical record on a shared screen, ensuring transparency and facilitating joint problem-solving for the problem list, overview, and plan. This approach promotes accurate, informed care. The treatment plan was discussed and reviewed in detail, including medication safety, potential side effects, and all patient questions. We confirmed understanding and comfort with the plan. Follow-up instructions were established, including contacting the office for any concerns, returning if symptoms worsen, persist, or new symptoms develop, and precautions for potential emergency department visits.

## 2023-10-21 NOTE — Assessment & Plan Note (Signed)
Chronic Kidney Disease Stage 2 (N18.2)  Current eGFR 60 Improved glycemic control (A1c 6.2) Plan:  Monitor renal function q3 months Maintain good hydration Continue strict glycemic control Early intervention for acute illness

## 2023-10-21 NOTE — Assessment & Plan Note (Signed)
Pernicious Anemia with B12 Deficiency (D51.0)  B12 level 316 with improved hemoglobin from 11.4 to 13.0 Failed trial of sublingual B12 supplementation although I think it helps some Plan: Initiate B12 injections every two weeks Prescription provided for syringes and needles Continue current OTC oral B12 supplementation Monitor response with repeat B12 levels

## 2023-10-24 LAB — H. PYLORI ANTIGEN, STOOL: H pylori Ag, Stl: NEGATIVE

## 2023-10-25 LAB — GASTRIN: Gastrin: 40 pg/mL (ref <=100)

## 2023-10-26 ENCOUNTER — Encounter: Payer: Self-pay | Admitting: Cardiology

## 2023-10-26 ENCOUNTER — Ambulatory Visit: Payer: 59 | Attending: Cardiology | Admitting: Cardiology

## 2023-10-26 VITALS — BP 121/72 | HR 79 | Resp 16 | Ht 75.0 in | Wt 239.2 lb

## 2023-10-26 DIAGNOSIS — I2584 Coronary atherosclerosis due to calcified coronary lesion: Secondary | ICD-10-CM | POA: Diagnosis not present

## 2023-10-26 DIAGNOSIS — E7800 Pure hypercholesterolemia, unspecified: Secondary | ICD-10-CM

## 2023-10-26 DIAGNOSIS — E781 Pure hyperglyceridemia: Secondary | ICD-10-CM

## 2023-10-26 DIAGNOSIS — I10 Essential (primary) hypertension: Secondary | ICD-10-CM

## 2023-10-26 DIAGNOSIS — E1165 Type 2 diabetes mellitus with hyperglycemia: Secondary | ICD-10-CM

## 2023-10-26 DIAGNOSIS — Z794 Long term (current) use of insulin: Secondary | ICD-10-CM

## 2023-10-26 NOTE — Patient Instructions (Signed)
 Medication Instructions:  Your physician recommends that you continue on your current medications as directed. Please refer to the Current Medication list given to you today.  *If you need a refill on your cardiac medications before your next appointment, please call your pharmacy*  Lab Work: None ordered today. If you have labs (blood work) drawn today and your tests are completely normal, you will receive your results only by: MyChart Message (if you have MyChart) OR A paper copy in the mail If you have any lab test that is abnormal or we need to change your treatment, we will call you to review the results.  Testing/Procedures: None ordered today.  Follow-Up: At [facility], you and your health needs are our priority.  As part of our continuing mission to provide you with exceptional heart care, we have created designated Provider Care Teams.  These Care Teams include your primary Cardiologist (physician) and Advanced Practice Providers (APPs -  Physician Assistants and Nurse Practitioners) who all work together to provide you with the care you need, when you need it.  Your next appointment:   6 month(s)  The format for your next appointment:   In Person  Provider:   Tessa Lerner, DO {

## 2023-10-26 NOTE — Progress Notes (Signed)
Cardiology Office Note:  .   Date:  10/26/2023  ID:  Frederick Robinson., DOB Jan 27, 1961, MRN [id number] PCP:  Lula Olszewski, MD  Former Cardiology Providers: NA [facility] Providers Cardiologist:  Tessa Lerner, DO , [facility]  Electrophysiologist:  None  Click to update primary MD,subspecialty MD or APP then REFRESH:1}    Chief Complaint  Patient presents with   Coronary atherosclerosis due to severely calcified coronary   Follow-up    History of Present Illness: .   Frederick Robinson. is a 62 y.o. Caucasian male whose past medical history and cardiovascular risk factors includes: Pernicious anemia, Severe coronary artery calcification, former smoker, hypertriglyceridemia, insulin-dependent diabetes mellitus type 2, hypertension, erectile dysfunction, hyperlipidemia.   Patient was referred to the practice for evaluation of CAD given his multiple cardiovascular risk factors.  His cardiovascular workup noted severe CAC with a total score of 628, placing him at the 92nd percentile.  Since then he has undergone appropriate ischemic workup as outlined below and since then we have been focusing on improving his modifiable cardiovascular risk factors which include pure hypercholesterolemia and hypertriglyceridemia.  He presents today for 6-month follow-up visit.  Since last office visit he denies anginal chest pain or heart failure symptoms.  Since last office visit he was diagnosed with pernicious anemia and has undergone endoscopy and additional GI workup.  Review of Systems: .   Review of Systems  Cardiovascular:  Negative for chest pain, claudication, irregular heartbeat, leg swelling, near-syncope, orthopnea, palpitations, paroxysmal nocturnal dyspnea and syncope.  Respiratory:  Negative for shortness of breath.   Hematologic/Lymphatic: Negative for bleeding problem.    Studies Reviewed:   EKG: EKG Interpretation Date/Time:  Monday October 26 2023 13:00:48 EST Text  Interpretation: Sinus rhythm with 1st degree A-V block When compared with ECG of 08-May-2023 12:51, No significant change since last tracing Confirmed by Tessa Lerner [phone]) on 10/26/2023 1:06:12 PM  Echocardiogram: 02/03/2022: Left ventricle cavity is normal in size. Mild concentric hypertrophy of the left ventricle. Normal global wall motion. Normal LV systolic function with EF 64%. Normal diastolic filling pattern. Mildly increased LVOT velocity. No significant valvular stenosis. No significant valvular stenosis. Normal right atrial pressure.   Stress Testing: Lexiscan (with Mod Bruce protocol) Nuclear stress test 06/25/2022   Myocardial perfusion is normal. Overall LV systolic function is normal without regional wall motion abnormalities.  Stress LV EF: 60%.  Nondiagnostic ECG stress. The heart rate response was consistent with Regadenoson.  The blood pressure response was physiologic. No previous exam available for comparison. Low risk study.   Heart Catheterization: None   Calcium Scoring.  02/25/2022: Left Main: 33.8 LAD: 239 LCx: 62.2 RCA: 292 Total Agatston Score: 628   MESA database percentile: 92nd Three-vessel coronary atherosclerotic calcifications. The observed calcium score of 628 is at the 92nd percentile for subjects of the same age, gender, and race/ethnicity who are free of clinical cardiovascular disease and treated diabetes.   Renal artery duplex  03/20/2022:  No evidence of renal artery occlusive disease in either renal artery. Mild increase in bilateral resistivity index and suggests medico-renal disease.  Renal length is within normal limits for both kidneys.  Normal abdominal aorta flow velocities noted.  RADIOLOGY: NA  Risk Assessment/Calculations:   NA   Labs:       Latest Ref Rng & Units 10/06/2023    3:00 PM 07/06/2023   10:11 AM 05/08/2023    1:43 PM  CBC  WBC 4.0 - 10.5 K/uL  5.9  4.9  4.7   Hemoglobin 13.0 - 17.0 g/dL 30.8  65.7  84.6    Hematocrit 39.0 - 52.0 % 39.1  34.7  35.7   Platelets 150.0 - 400.0 K/uL 235.0  209.0  218        Latest Ref Rng & Units 10/06/2023    3:00 PM 08/17/2023   11:56 AM 08/17/2023   10:21 AM  BMP  Glucose 70 - 99 mg/dL 962  78    BUN 6 - 23 mg/dL 20  13    Creatinine 9.52 - 1.50 mg/dL 8.41  3.24    BUN/Creat Ratio 10 - 24  10    Sodium 135 - 145 mEq/L 140  145    Potassium 3.5 - 5.1 mEq/L 4.6  4.6    Chloride 96 - 112 mEq/L 107  109    CO2 19 - 32 mEq/L 25  22    Calcium 8.4 - 10.5 mg/dL 9.8  9.6  9.6       Latest Ref Rng & Units 10/06/2023    3:00 PM 08/17/2023   11:56 AM 08/17/2023   10:21 AM  CMP  Glucose 70 - 99 mg/dL 401  78    BUN 6 - 23 mg/dL 20  13    Creatinine 0.27 - 1.50 mg/dL 2.53  6.64    Sodium 403 - 145 mEq/L 140  145    Potassium 3.5 - 5.1 mEq/L 4.6  4.6    Chloride 96 - 112 mEq/L 107  109    CO2 19 - 32 mEq/L 25  22    Calcium 8.4 - 10.5 mg/dL 9.8  9.6  9.6   Total Protein 6.0 - 8.3 g/dL 7.3  7.2    Total Bilirubin 0.2 - 1.2 mg/dL 0.4  0.3    Alkaline Phos 39 - 117 U/L 48  56    AST 0 - 37 U/L 22  31    ALT 0 - 53 U/L 17  25      Lab Results  Component Value Date   CHOL 164 08/17/2023   HDL 50 08/17/2023   LDLCALC 80 08/17/2023   LDLDIRECT 73 08/17/2023   TRIG 201 (H) 08/17/2023   CHOLHDL 2.8 10/16/2022   No results for input(s): "LIPOA" in the last 8760 hours. No components found for: "NTPROBNP" No results for input(s): "PROBNP" in the last 8760 hours. No results for input(s): "TSH" in the last 8760 hours.  External Labs:  Date Collected: 07/30/2021 , information obtained by referring physician Potassium: 4.5 Creatinine 1.10 mg/dL. eGFR: 72 mL/min per 1.73 m Lipid profile: Total cholesterol 232 , triglycerides 384 , HDL 48 , LDL 107, non-HDL 474 AST: 40 , ALT: 55 , alkaline phosphatase: 56  Hemoglobin A1c: 8.7 TSH: 8.12    Lipid Panel   2022-02-06    Cholesterol 189   <200  Cholesterol / HDL Ratio 6.10   0.00-4.99  HDL Cholesterol 31    >39  LDL Cholesterol (Calculation) SEE COMMENT   <130  LDL/HDL Ratio SEE COMMENT   <3.3  Non-HDL Cholesterol 158   <130  Triglycerides 413   <150   Lipid Panel Reviewed date:01/01/2023 07:55:42 AM Interpretation: Performing [facility]:DGLOV[facility] [place], [address], [place], [place] [id number], Phone - [phone], Director - MDNagendra Notes/Report:  Cholesterol, Total 190 100-199 mg/dL    Triglycerides 010 9-323 mg/dL    HDL Cholesterol 45 >55 mg/dL    VLDL Cholesterol Cal 52 5-40 mg/dL    LDL  Chol Calc (NIH) 93 0-99 mg/dL     Physical Exam:    Today's Vitals   10/26/23 1259  BP: 121/72  Pulse: 79  Resp: 16  SpO2: 98%  Weight: 239 lb 3.2 oz (108.5 kg)  Height: 6\' 3"  (1.905 m)   Body mass index is 29.9 kg/m. Wt Readings from Last 3 Encounters:  10/26/23 239 lb 3.2 oz (108.5 kg)  10/21/23 242 lb (109.8 kg)  10/14/23 243 lb (110.2 kg)    Physical Exam  Constitutional: No distress.  Age appropriate, hemodynamically stable.   Neck: No JVD present.  Cardiovascular: Normal rate, regular rhythm, S1 normal, S2 normal, intact distal pulses and normal pulses. Exam reveals no gallop, no S3 and no S4.  No murmur heard. Pulses:      Dorsalis pedis pulses are 2+ on the right side and 2+ on the left side.       Posterior tibial pulses are 2+ on the right side and 2+ on the left side.  Pulmonary/Chest: Effort normal and breath sounds normal. No stridor. He has no wheezes. He has no rales.  Abdominal: Soft. Bowel sounds are normal. He exhibits no distension. There is no abdominal tenderness.  Musculoskeletal:        General: No edema.     Cervical back: Neck supple.  Neurological: He is alert and oriented to person, place, and time. He has intact cranial nerves (2-12).  Skin: Skin is warm and moist.    Impression & Recommendation(s):  Impression:   ICD-10-CM   1. Coronary atherosclerosis due to severely calcified coronary lesion  I25.84 EKG 12-Lead    2. Pure hypercholesterolemia   E78.00     3. Hypertriglyceridemia  E78.1     4. Type 2 diabetes mellitus with hyperglycemia, with long-term current use of insulin (HCC)  E11.65    Z79.4     5. Benign hypertension  I10        Recommendation(s):  Coronary atherosclerosis due to severely calcified coronary lesion Total CAC 628, 92nd percentile. Currently on aspirin 81 mg p.o. daily. Continue Repatha. Continue fenofibrate. Continue Vascepa. Continue Crestor 20 mg p.o. nightly. EKG nonischemic. Has undergone appropriate ischemic workup in the past. Reemphasized the importance of secondary prevention with focus on improving her modifiable cardiovascular risk factors such as glycemic control, lipid management, blood pressure control, weight loss.  Pure hypercholesterolemia Currently on Crestor 40 mg p.o. nightly, Repatha.   He denies myalgia or other side effects. Most recent lipids dated September 2024, independently reviewed as noted above.  LDL within acceptable limits  Hypertriglyceridemia Most recent triglyceride levels are 201 mg/dL. Currently on Vascepa 1 g 2 tabs twice a day. Currently on fenofibrate 200 mg p.o. daily Though the triglyceride levels are not at goal they are much better readings.  Reemphasized importance of improving his modifiable cardiovascular risk factors and dietary restrictions.  Type 2 diabetes mellitus with hyperglycemia, with long-term current use of insulin (HCC) Reemphasized the importance of glycemic control. Currently on ARB, statin lipid-lowering agents, Vascepa and fenofibrate. Follows with Dr. Cleon Gustin.  Plans to have repeat labs in February 2025.  Benign hypertension Office blood pressures are very well-controlled. Continue amlodipine 10 mg p.o. daily. Continue carvedilol 12.5 mg p.o. twice daily. Continue Clydie Braun Dia 10 mg p.o. daily. Continue BiDil 20/37.5 mg p.o. 3 times daily. Continue losartan 100 mg p.o. daily Reemphasized importance of low-salt diet.  Orders  Placed:  Orders Placed This Encounter  Procedures   EKG 12-Lead   Final  Medication List:   No orders of the defined types were placed in this encounter.   There are no discontinued medications.   Current Outpatient Medications:    amLODipine (NORVASC) 10 MG tablet, Take 1 tablet (10 mg total) by mouth daily at 10 pm., Disp: 90 tablet, Rfl: 3   aspirin 81 MG tablet, Take 81 mg by mouth daily., Disp: , Rfl:    B-D UF III MINI PEN NEEDLES 31G X 5 MM MISC, USE AS DIRECTED, Disp: 100 each, Rfl: 5   carvedilol (COREG) 12.5 MG tablet, Take 1 tablet (12.5 mg total) by mouth 2 (two) times daily with a meal., Disp: 180 tablet, Rfl: 3   Cobalamin Combinations (B-12) 1000-400 MCG SUBL, Place 1 tablet under the tongue daily., Disp: 90 tablet, Rfl: 3   Continuous Glucose Sensor (DEXCOM G6 SENSOR) MISC, USE AS DIRECTED. NEED TO REPLACE EVERY 10 DAYS., Disp: 9 each, Rfl: 3   Continuous Glucose Transmitter (DEXCOM G6 TRANSMITTER) MISC, USE AS DIRECTED, Disp: 1 each, Rfl: 0   dapagliflozin propanediol (FARXIGA) 10 MG TABS tablet, Take by mouth daily., Disp: , Rfl:    diclofenac Sodium (VOLTAREN) 1 % GEL, USE AS DIRECTED, Disp: 50 g, Rfl: 2   Evolocumab (REPATHA SURECLICK) 140 MG/ML SOAJ, INJECT 140 MG INTO THE SKIN EVERY 14 (FOURTEEN) DAYS., Disp: 6 mL, Rfl: 3   fenofibrate micronized (LOFIBRA) 200 MG capsule, daily before breakfast., Disp: , Rfl:    Finerenone (KERENDIA) 10 MG TABS, Take 1 tablet (10 mg total) by mouth daily., Disp: 90 tablet, Rfl: 3   icosapent Ethyl (VASCEPA) 1 g capsule, Take 2 capsules (2 g total) by mouth 2 (two) times daily., Disp: 180 capsule, Rfl: 32   isosorbide-hydrALAZINE (BIDIL) 20-37.5 MG tablet, Take 1 tablet by mouth 3 (three) times daily., Disp: 270 tablet, Rfl: 3   levothyroxine (SYNTHROID) 112 MCG tablet, Take 224 mcg by mouth daily., Disp: , Rfl:    loratadine (CLARITIN) 10 MG tablet, Take 10 mg by mouth daily., Disp: , Rfl:    losartan (COZAAR) 100 MG tablet, Take 1  tablet (100 mg total) by mouth daily at 10 pm., Disp: 90 tablet, Rfl: 0   Needles & Syringes MISC, Inject 1 mL into the muscle once a week., Disp: 12 pen , Rfl: 0   rosuvastatin (CRESTOR) 40 MG tablet, Take 40 mg by mouth daily., Disp: , Rfl:    sertraline (ZOLOFT) 50 MG tablet, Take 1 tablet (50 mg total) by mouth daily., Disp: 90 tablet, Rfl: 3   TRESIBA FLEXTOUCH 200 UNIT/ML FlexTouch Pen, Inject 50 Units into the skin in the morning and at bedtime., Disp: , Rfl:   Consent:   N/A  Disposition:   6 months sooner if needed Patient may be asked to follow-up sooner based on the results of the above-mentioned testing.  His questions and concerns were addressed to his satisfaction. He voices understanding of the recommendations provided during this encounter.    Signed, Tessa Lerner, DO, [facility] [place]  [facility]  [address] [place], [place] [address] 10/26/2023 1:32 PM

## 2023-10-27 ENCOUNTER — Telehealth: Payer: Self-pay

## 2023-10-27 NOTE — Telephone Encounter (Signed)
Pt used HST device and completed study. Please do not charge patient $300 fee for not using device. Thank you!

## 2023-10-29 ENCOUNTER — Telehealth: Payer: Self-pay | Admitting: Neurology

## 2023-10-29 ENCOUNTER — Other Ambulatory Visit: Payer: Self-pay | Admitting: Neurology

## 2023-10-29 DIAGNOSIS — G4733 Obstructive sleep apnea (adult) (pediatric): Secondary | ICD-10-CM

## 2023-10-29 NOTE — Telephone Encounter (Signed)
-----   Message from Waverly Dohmeier sent at 10/29/2023  2:17 PM EST ----- Patient tested positive for severe OSA and should be treated with PAP. Order to DME for CPAP 6-16 cm water, 3 cm EPR and heated humidification,mask to be fitted.  Avoiding the supine sleep position is highly reccommended.

## 2023-10-29 NOTE — Procedures (Signed)
[facility]   HOME SLEEP TEST REPORT ( by Watch PAT)   STUDY DATE:  MAIL out Device- delayed 10-29-2023 DOB:  1961/07/18 MRN: [id number]   ORDERING CLINICIAN: Melvyn Novas, MD  REFERRING CLINICIAN: Dr Odis Hollingshead.    CLINICAL INFORMATION/HISTORY: Hypertension control issues, possibly complicated by OSA? I have the pleasure of seeing Frederick Robinson [birth year] a right-handed male with a possible sleep disorder, we are looking for a causes of HTN.     Sleep relevant medical history: DM, Thyroid disease, HTN, B 12 deficiency, anemia.  Nocturia 1 time-2 times, Tonsillectomy at age 7,  thyroid disease.      FINDINGS:   Sleep Summary:   Total Recording Time (hours, min):    9 hours 59 minutes   Total Sleep Time (hours, min):     8 hours 34 minutes            Percent REM (%):       25%                                 Respiratory Indices:   Calculated pAHI (per hour):        By AASM criteria 38.6/h                     REM pAHI:     34.2/h                                            NREM pAHI:    40.1/h                          Supine AHI:    The patient slept for the longest time in supine sleep position associated with an AHI of 60.4/h followed by 204 minutes in right lateral sleep position with an AHI of 15.1/h and 97 minutes on his left side with an AHI of 40.3/h.  Snoring level reached a mean volume of 41 dB and was present for less than 20% of total sleep time.                                               Oxygen Saturation Statistics:   Oxygen Saturation (%) Mean:    95%           Minimum oxygen saturation (%):   78%         O2 Saturation Range (%):     Between a nadir of 78% with a maximum saturation of 99%                                  O2 Saturation (minutes) <89%:       5 minutes  Pulse Rate Statistics:   Pulse Mean (bpm):     60 bpm            Pulse Range:      Between 46 and 91 bpm.  Please note that the cardiac rhythm cannot be established by this test  device.  IMPRESSION:  This HST confirms the presence of of severe all obstructive sleep apnea.  Remarkably there is a higher apnea hypopnea index seen in non-REM sleep in comparison to REM sleep. The first 3 hours of the sleep study were more fragmented than the second half of the sleep study.  Based on these data I would recommend positive airway pressure therapy for this patient as a treat snoring as well as apnea and hypoxia.  This patient's body mass index is under 30 and through the reduction of weight may benefit snoring and apnea to a minor degree but will not be enough to alleviate apnea.   RECOMMENDATION: Due to the severity of this obstructive sleep apnea I would recommend strongly positive airway pressure therapy and this should be provided through a auto titration CPAP device by [employer] with a setting between 6 and 16 cm water pressure, 2 cm water EPR, heated humidification and interface of patient's choice and comfort.    INTERPRETING PHYSICIAN:   Melvyn Novas, MD

## 2023-10-29 NOTE — Telephone Encounter (Signed)
I called pt. I advised pt that Dr. Vickey Huger reviewed their sleep study results and found that pt severe sleep apnea. Dr. Vickey Huger recommends that pt starts auto CPAP. I reviewed PAP compliance expectations with the pt. Pt is agreeable to starting a CPAP. I advised pt that an order will be sent to a DME, [facility], and [employer] [facility] will call the pt within about one week after they file with the pt's insurance. [facility] will show the pt how to use the machine, fit for masks, and troubleshoot the CPAP if needed. A follow up appt will need to be made for insurance purposes with Dr. Vickey Huger or NP within 31-90 days from the date started CPAP. Pt verbalized understanding to arrive 15 minutes early and bring their CPAP. Pt verbalized understanding of results. Pt had no questions at this time but was encouraged to call back if questions arise. I have sent the order to [employer] [facility] and have received confirmation that they have received the order.

## 2023-10-30 ENCOUNTER — Encounter: Payer: Self-pay | Admitting: Internal Medicine

## 2023-10-30 DIAGNOSIS — G4733 Obstructive sleep apnea (adult) (pediatric): Secondary | ICD-10-CM | POA: Insufficient documentation

## 2023-10-30 LAB — SURGICAL PATHOLOGY

## 2023-11-01 ENCOUNTER — Encounter: Payer: Self-pay | Admitting: Internal Medicine

## 2023-11-01 DIAGNOSIS — K2940 Chronic atrophic gastritis without bleeding: Secondary | ICD-10-CM | POA: Insufficient documentation

## 2023-11-06 ENCOUNTER — Encounter: Payer: Self-pay | Admitting: Internal Medicine

## 2023-11-06 NOTE — Telephone Encounter (Signed)
 Care team updated and letter sent for eye exam notes.

## 2023-12-10 ENCOUNTER — Ambulatory Visit: Payer: 59 | Admitting: Internal Medicine

## 2023-12-11 ENCOUNTER — Ambulatory Visit: Payer: 59 | Admitting: Internal Medicine

## 2024-01-06 DIAGNOSIS — E039 Hypothyroidism, unspecified: Secondary | ICD-10-CM | POA: Diagnosis not present

## 2024-01-06 DIAGNOSIS — E7800 Pure hypercholesterolemia, unspecified: Secondary | ICD-10-CM | POA: Diagnosis not present

## 2024-01-06 DIAGNOSIS — E1165 Type 2 diabetes mellitus with hyperglycemia: Secondary | ICD-10-CM | POA: Diagnosis not present

## 2024-01-13 DIAGNOSIS — E7800 Pure hypercholesterolemia, unspecified: Secondary | ICD-10-CM | POA: Diagnosis not present

## 2024-01-13 DIAGNOSIS — I10 Essential (primary) hypertension: Secondary | ICD-10-CM | POA: Diagnosis not present

## 2024-01-13 DIAGNOSIS — E039 Hypothyroidism, unspecified: Secondary | ICD-10-CM | POA: Diagnosis not present

## 2024-01-13 DIAGNOSIS — E1165 Type 2 diabetes mellitus with hyperglycemia: Secondary | ICD-10-CM | POA: Diagnosis not present

## 2024-01-13 DIAGNOSIS — E559 Vitamin D deficiency, unspecified: Secondary | ICD-10-CM | POA: Diagnosis not present

## 2024-01-13 DIAGNOSIS — E1140 Type 2 diabetes mellitus with diabetic neuropathy, unspecified: Secondary | ICD-10-CM | POA: Diagnosis not present

## 2024-01-15 ENCOUNTER — Encounter: Payer: Self-pay | Admitting: Internal Medicine

## 2024-01-15 ENCOUNTER — Other Ambulatory Visit: Payer: Self-pay

## 2024-01-15 ENCOUNTER — Telehealth: Payer: Self-pay | Admitting: Internal Medicine

## 2024-01-15 DIAGNOSIS — D649 Anemia, unspecified: Secondary | ICD-10-CM

## 2024-01-15 DIAGNOSIS — D510 Vitamin B12 deficiency anemia due to intrinsic factor deficiency: Secondary | ICD-10-CM

## 2024-01-15 DIAGNOSIS — E538 Deficiency of other specified B group vitamins: Secondary | ICD-10-CM

## 2024-01-15 DIAGNOSIS — E611 Iron deficiency: Secondary | ICD-10-CM

## 2024-01-15 DIAGNOSIS — D519 Vitamin B12 deficiency anemia, unspecified: Secondary | ICD-10-CM

## 2024-01-15 DIAGNOSIS — Z125 Encounter for screening for malignant neoplasm of prostate: Secondary | ICD-10-CM

## 2024-01-15 NOTE — Telephone Encounter (Signed)
 Inbound call from patient stating he is having complications with gastritis. Patient is currently scheduled for 4/15 at 3:00 pm. Requesting a call to discuss options to help in the meantime. Please advise, thank you.

## 2024-01-15 NOTE — Telephone Encounter (Signed)
 See note

## 2024-01-15 NOTE — Telephone Encounter (Signed)
 Ordered labs for patient

## 2024-01-15 NOTE — Telephone Encounter (Signed)
 Pt stated that he has had an increase in burping, flatulence, having loose stools after eating meals. Pt recently had an EGD and Colon on 10/13/2024. Pt requesting an office visit to follow up with current symptoms. Pt was scheduled for an office visit on 01/27/2024 at 3:30. Pt made aware.  Pt verbalized understanding with all questions answered.

## 2024-01-22 ENCOUNTER — Other Ambulatory Visit (INDEPENDENT_AMBULATORY_CARE_PROVIDER_SITE_OTHER)

## 2024-01-22 DIAGNOSIS — D518 Other vitamin B12 deficiency anemias: Secondary | ICD-10-CM

## 2024-01-22 DIAGNOSIS — D649 Anemia, unspecified: Secondary | ICD-10-CM

## 2024-01-22 DIAGNOSIS — E611 Iron deficiency: Secondary | ICD-10-CM | POA: Diagnosis not present

## 2024-01-22 DIAGNOSIS — E538 Deficiency of other specified B group vitamins: Secondary | ICD-10-CM

## 2024-01-22 DIAGNOSIS — D510 Vitamin B12 deficiency anemia due to intrinsic factor deficiency: Secondary | ICD-10-CM | POA: Diagnosis not present

## 2024-01-22 DIAGNOSIS — D519 Vitamin B12 deficiency anemia, unspecified: Secondary | ICD-10-CM

## 2024-01-22 DIAGNOSIS — Z125 Encounter for screening for malignant neoplasm of prostate: Secondary | ICD-10-CM | POA: Diagnosis not present

## 2024-01-22 DIAGNOSIS — R634 Abnormal weight loss: Secondary | ICD-10-CM

## 2024-01-22 LAB — CBC WITH DIFFERENTIAL/PLATELET
Basophils Absolute: 0.1 10*3/uL (ref 0.0–0.1)
Basophils Relative: 1.2 % (ref 0.0–3.0)
Eosinophils Absolute: 0.3 10*3/uL (ref 0.0–0.7)
Eosinophils Relative: 5.7 % — ABNORMAL HIGH (ref 0.0–5.0)
HCT: 42.9 % (ref 39.0–52.0)
Hemoglobin: 14.4 g/dL (ref 13.0–17.0)
Lymphocytes Relative: 33.3 % (ref 12.0–46.0)
Lymphs Abs: 1.7 10*3/uL (ref 0.7–4.0)
MCHC: 33.5 g/dL (ref 30.0–36.0)
MCV: 90.1 fl (ref 78.0–100.0)
Monocytes Absolute: 0.3 10*3/uL (ref 0.1–1.0)
Monocytes Relative: 6.7 % (ref 3.0–12.0)
Neutro Abs: 2.7 10*3/uL (ref 1.4–7.7)
Neutrophils Relative %: 53.1 % (ref 43.0–77.0)
Platelets: 251 10*3/uL (ref 150.0–400.0)
RBC: 4.76 Mil/uL (ref 4.22–5.81)
RDW: 14.5 % (ref 11.5–15.5)
WBC: 5.1 10*3/uL (ref 4.0–10.5)

## 2024-01-22 LAB — IBC + FERRITIN
Ferritin: 51.7 ng/mL (ref 22.0–322.0)
Iron: 84 ug/dL (ref 42–165)
Saturation Ratios: 17.8 % — ABNORMAL LOW (ref 20.0–50.0)
TIBC: 473.2 ug/dL — ABNORMAL HIGH (ref 250.0–450.0)
Transferrin: 338 mg/dL (ref 212.0–360.0)

## 2024-01-22 LAB — PSA: PSA: 0.89 ng/mL (ref 0.10–4.00)

## 2024-01-22 LAB — B12 AND FOLATE PANEL
Folate: 10.4 ng/mL (ref >5.9)
Vitamin B-12: >1537 pg/mL — ABNORMAL HIGH (ref 211–911)

## 2024-01-24 ENCOUNTER — Encounter: Payer: Self-pay | Admitting: Internal Medicine

## 2024-01-24 DIAGNOSIS — E611 Iron deficiency: Secondary | ICD-10-CM | POA: Insufficient documentation

## 2024-01-24 NOTE — Progress Notes (Signed)
 Reviewed labs from 01/22/2024. B12 level markedly elevated (>1537 pg/mL) indicating excellent response to injection therapy but suggesting need to reduce frequency. Hemoglobin now normal at 14.4 g/dL with resolution of anemia. Ferritin improved to 51.7 ng/mL but transferrin saturation remains slightly low at 17.8%. PSA stable at 0.89 ng/mL. Plan to adjust B12 injection schedule, continue iron supplementation, and monitor iron parameters. Will discuss at appointment on 01/25/2024. Detailed explanation in Patient Message.

## 2024-01-25 ENCOUNTER — Encounter: Payer: Self-pay | Admitting: Internal Medicine

## 2024-01-25 ENCOUNTER — Ambulatory Visit: Payer: Self-pay | Admitting: Neurology

## 2024-01-25 ENCOUNTER — Ambulatory Visit (INDEPENDENT_AMBULATORY_CARE_PROVIDER_SITE_OTHER): Payer: 59 | Admitting: Internal Medicine

## 2024-01-25 ENCOUNTER — Encounter: Payer: Self-pay | Admitting: Neurology

## 2024-01-25 VITALS — BP 169/83 | HR 53 | Temp 98.2°F | Ht 75.0 in | Wt 241.4 lb

## 2024-01-25 VITALS — BP 123/64 | HR 69 | Ht 75.0 in | Wt 243.0 lb

## 2024-01-25 DIAGNOSIS — E310 Autoimmune polyglandular failure: Secondary | ICD-10-CM | POA: Diagnosis not present

## 2024-01-25 DIAGNOSIS — E039 Hypothyroidism, unspecified: Secondary | ICD-10-CM | POA: Diagnosis not present

## 2024-01-25 DIAGNOSIS — G629 Polyneuropathy, unspecified: Secondary | ICD-10-CM

## 2024-01-25 DIAGNOSIS — K638219 Small intestinal bacterial overgrowth, unspecified: Secondary | ICD-10-CM | POA: Diagnosis not present

## 2024-01-25 DIAGNOSIS — E1122 Type 2 diabetes mellitus with diabetic chronic kidney disease: Secondary | ICD-10-CM

## 2024-01-25 DIAGNOSIS — E1142 Type 2 diabetes mellitus with diabetic polyneuropathy: Secondary | ICD-10-CM | POA: Insufficient documentation

## 2024-01-25 DIAGNOSIS — E611 Iron deficiency: Secondary | ICD-10-CM | POA: Diagnosis not present

## 2024-01-25 DIAGNOSIS — Z794 Long term (current) use of insulin: Secondary | ICD-10-CM

## 2024-01-25 DIAGNOSIS — N1831 Chronic kidney disease, stage 3a: Secondary | ICD-10-CM | POA: Diagnosis not present

## 2024-01-25 DIAGNOSIS — M1990 Unspecified osteoarthritis, unspecified site: Secondary | ICD-10-CM

## 2024-01-25 DIAGNOSIS — D510 Vitamin B12 deficiency anemia due to intrinsic factor deficiency: Secondary | ICD-10-CM | POA: Diagnosis not present

## 2024-01-25 DIAGNOSIS — Z0000 Encounter for general adult medical examination without abnormal findings: Secondary | ICD-10-CM

## 2024-01-25 DIAGNOSIS — K2940 Chronic atrophic gastritis without bleeding: Secondary | ICD-10-CM | POA: Diagnosis not present

## 2024-01-25 MED ORDER — KERENDIA 20 MG PO TABS: 20.0000 mg | ORAL_TABLET | Freq: Every day | ORAL | 1 refills | Status: DC

## 2024-01-25 MED ORDER — ALPHA LIPOIC ACID-BIOTIN ER 600-450 MG-MCG PO TBCR: 600.0000 mg | EXTENDED_RELEASE_TABLET | Freq: Every day | ORAL | 3 refills | Status: DC

## 2024-01-25 MED ORDER — RIFAXIMIN 550 MG PO TABS: 550.0000 mg | ORAL_TABLET | Freq: Three times a day (TID) | ORAL | 0 refills | Status: DC

## 2024-01-25 NOTE — Assessment & Plan Note (Signed)
 Small Intestinal Bacterial Overgrowth (SIBO) (ICD-10: K90.89) Assessment: Strong clinical suspicion for SIBO given: Nocturnal bloating and abdominal distension Known atrophic gastritis with hypochlorhydria (predisposing factor) Nutrient malabsorption consistent with laboratory findings Testing unavailable but high clinical suspicion warrants empiric treatment. Plan: Empiric therapy with Rifaximin 550 mg PO TID for 14 days Initiate low FODMAP diet with gradual reintroduction after treatment Consider prokinetic therapy if symptoms recur after antibiotic course Evaluate response to empiric therapy at 2-week follow-up Consider jejunal aspirate during next endoscopy if symptoms persist Maintain adequate vitamin D supplementation (current level low at 14.89 ng/mL)

## 2024-01-25 NOTE — Assessment & Plan Note (Signed)
 Type 2 Diabetes Mellitus with Diabetic Nephropathy (ICD-10: E11.22, Z79.4) Assessment: Well-controlled diabetes with recent A1c of 6.2% (05/29/2023). Patient demonstrates evidence of diabetic nephropathy with mildly reduced eGFR of 58.44 mL/min (10/06/2023) and microalbuminuria (ACR 56 mg/g, 05/29/2023). Contributing to polyneuropathy despite good control. Plan: Continue current diabetes management with Tresiba insulin and Marcelline Deist Continue close monitoring of renal function with eGFR and urine albumin/creatinine ratio every 3-6 months Restart Kerendia (finerenone) 20 mg daily for renal protection Maintain blood pressure control with current antihypertensive regimen Annual diabetic eye examination (currently overdue) Annual comprehensive foot examination with monofilament testing

## 2024-01-25 NOTE — Patient Instructions (Signed)
 After Visit Summary Date: January 25, 2024 Provider: Primary Care Provider Visit Diagnosis Peripheral neuropathy Pernicious anemia (vitamin B12 deficiency) Autoimmune gastritis with suspected small intestinal bacterial overgrowth (SIBO) Autoimmune polyendocrine syndrome Type 2 diabetes mellitus Chronic kidney disease (stage 3A) Hypothyroidism Today's Care Plan Medications New medication: Rifaximin 550 mg - Take one tablet by mouth three times daily for 14 days (for SIBO treatment) Continue: Sublingual vitamin B12 5000 mcg daily Restart: Kerendia (finerenone) 10 mg daily Continue: All other current medications as prescribed Diet and Lifestyle Recommendations Low FODMAP Diet: Start a low FODMAP diet to reduce bacterial fermentation in your intestines. This means temporarily avoiding: High-fructose fruits (apples, pears, watermelon) Lactose-containing dairy Wheat and rye products Certain vegetables (onions, garlic, mushrooms) See the handout provided for a complete guide Balance exercises: Practice standing with feet together and eyes closed for 30 seconds at a time, 3 times daily (only when someone is nearby for safety) Home safety: Use non-slip mats in the shower, consider a shower chair, and ensure adequate lighting in bathrooms and hallways to prevent falls Tests Ordered Today Specialized blood tests for arthritis evaluation Tests to check thyroid function Methylmalonic acid test (to check how your body is using vitamin B12) Follow-up Appointments Laboratory testing: Please stop at the lab today for blood work Gastroenterology: Continue with your scheduled appointment on Wednesday Rheumatology: Our office will call you to schedule a rheumatology appointment after your test results are available Primary Care: Return to clinic in 1 month to check on your response to SIBO treatment Important Information About Your Conditions Peripheral Neuropathy Your nerve symptoms are related to  both long-term B12 deficiency and diabetes Even though your B12 levels are now normal, nerve healing takes 6-12 months Report any worsening numbness, weakness, or new falls Pernicious Anemia (B12 Deficiency) Your B12 levels have improved dramatically with the sublingual supplements You will need lifelong B12 supplementation Your anemia has resolved with the B12 treatment Small Intestinal Bacterial Overgrowth (SIBO) We suspect this condition based on your symptoms and risk factors The antibiotic (Rifaximin) targets bacteria in your small intestine The low FODMAP diet reduces bacterial food sources Call if you develop diarrhea, increased bloating, or abdominal pain Autoimmune Conditions You have evidence of multiple autoimmune conditions affecting your thyroid, stomach, and possibly joints Your rheumatology referral will help determine if arthritis is related to your other autoimmune conditions Your pernicious anemia is an autoimmune condition requiring lifelong B12 replacement When to Seek Medical Attention Call our office if you experience: Worsening numbness or tingling New or worsening joint pain or swelling Increased gastrointestinal symptoms during treatment Symptoms of low blood sugar (if using insulin) Dizziness or falls Seek emergency care if you experience: Severe abdominal pain Inability to keep food or fluids down Severe dizziness or fainting Chest pain or shortness of breath Next Steps Stop at the laboratory for blood tests today Start the Rifaximin and low FODMAP diet tomorrow Restart your Chauncey Mann medication Attend your gastroenterology appointment on Wednesday Our office will call you with test results and to schedule your rheumatology appointment Return to clinic in 1 month Questions? Please call our office at [phone] during business hours. After hours, please call [phone] for urgent concerns.

## 2024-01-25 NOTE — Progress Notes (Signed)
 [facility]  Provider:  Dr Shontay Wallner Referring Provider:  Dr Talmage Nap, MD  Primary Care Physician:  Frederick Olszewski, MD  Chief Complaint  Patient presents with   New Patient (Initial Visit)    Pt alone, rm 2. Pt has diabetes and his A1c has been well controlled and Here today because he is having neuropathy that is worsening despite A1C controlled. He complains more of numbness and he has noticed that extends to to fingers and hands. He has also noticed headaches during sleep.     HPI:  Frederick Robinson. is a 63 y.o. male and seen here upon referral from Dr. Talmage Nap for a Consultation/ Evaluation of Neuropathy.   [name] has a longstanding history of DM  ( 20 year history) , at times not well controlled, has hypothyroidism,  B12 deficiency- related to gastritis.  In October, we had met and I dx sleep apnea, CPAP has not been initiated yet as he switched insurances and was on [employer] COBRA- and now enrolled in [place].   Recently had B12 and TSH labs. VIT B12 is coming back up. Iron levels were low, feritin remained low after iron supplements.  He has less nocturia since being on a dex cam.    This patient reports onset of non- painful sensory symptoms beginning and advancing from the toes. over a period of 5-7 years.  This numbness has ascended to just below the knee and over the last 2-3 years started in both hands as well, the third component is sensorineural hearing loss with constant high -pitched sounds.     Last visit : Sleep consultation:  Frederick Robinson is a 63 y.o. male patient who is seen upon a cardiologist referral on 08/18/2023 to evaluate for sleep apnea,  he has hypothyroidism, DM , for many decades and recently his BP were high in AM. He has also headaches at night.   He is now on 3 antihypertensive meds and still struggles with high BP readings. Nephrologist started [place] and endocrinologist started ozempic .     Chief concern according to patient :  BP  reading in AM higher.    I have the pleasure of seeing Frederick Robinson [birth year] a right-handed male with a possible sleep disorder, we are looking for a causes of HTN.     Sleep relevant medical history: DM, Thyroid disease, HTN, B 12 deficiency, anemia.  Nocturia 1 time-2 times, Tonsillectomy at age 1,  thyroid disease.     Family medical /sleep history: Thyroid maternal family, cancer had kidney cancer, father liver cancer.  Brother  ( MD , a year younger than the patient ) passed form bladder cancer. G Father on CPAP with OSA,  mother had CAD, 4 vessel- bypass. Strong paternal history of Parkinson's disease, bipolar disorder.  Paternal aunt and father.    Social history:  He is busy as a father of a 64 year -old. 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  He used to travel a lot.      Tobacco use; quit at 24.   ETOH use ; moderate weekend 10 drinks- snores more after wards.GERD, snoring.  Caffeine intake in form of Coffee( 2-3 cups in AM ) Soda( /) Tea ( /) or energy drinks Exercise in form of walking.   Hobbies :golf.   Sleep habits are as follows: The patient's dinner time is between 6-7 PM.  The patient goes to bed at 10 PM and continues to sleep for 7-8 hours, wakes for one bathroom break. The preferred sleep position is laterally, with the support of 1-2 pillows.  Dreams are reportedly frequent. The patient wakes up spontaneously at 7.50 with an alarm set for 8 AM . 8 AM is the usual rise time.He reports not feeling refreshed or restored in AM, with symptoms such as dry mouth, recently morning headaches, and residual fatigue. Naps are taken infrequently and are less refreshing than nocturnal sleep.     Review of Systems: Out of a complete 14 system review, the patient complains of only the following symptoms, and all other reviewed systems are negative.  Tremor .   Neuropathy sensory loss, positive  Romberg.      Social History   Socioeconomic History   Marital status: Married    Spouse name: Marcelino Duster   Number of children: 1   Years of education: Not on file   Highest education level: Bachelor's degree (e.g., BA, AB, BS)  Occupational History   Not on file  Tobacco Use   Smoking status: Former    Current packs/day: 0.00    Average packs/day: 2.0 packs/day for 25.0 years (50.0 ttl pk-yrs)    Types: Cigarettes, Cigars    Start date: 07/12/1984    Quit date: 07/12/2009    Years since quitting: 14.5   Smokeless tobacco: Never  Vaping Use   Vaping status: Never Used  Substance and Sexual Activity   Alcohol use: Yes    Alcohol/week: 16.0 standard drinks of alcohol    Types: 6 Glasses of wine, 10 Standard drinks or equivalent per week    Comment: weekends   Drug use: No   Sexual activity: Yes    Birth control/protection: None  Other Topics Concern   Not on file  Social History Narrative   Not on file   Social Drivers of Health   Financial Resource Strain: Low Risk  (10/20/2023)   Overall Financial Resource Strain (CARDIA)    Difficulty of Paying Living Expenses: Not hard at all  Food Insecurity: No Food Insecurity (10/20/2023)   Hunger Vital Sign    Worried About Running Out of Food in the Last Year: Never true    Ran Out of Food in the Last Year: Never true  Transportation Needs: No Transportation Needs (10/20/2023)   PRAPARE - Administrator, Civil Service (Medical): No    Lack of Transportation (Non-Medical): No  Physical Activity: Insufficiently Active (10/20/2023)   Exercise Vital Sign    Days of Exercise per Week: 7 days    Minutes of Exercise per Session: 20 min  Stress: No Stress Concern Present (10/20/2023)   [employer] of Occupational Health - Occupational Stress Questionnaire    Feeling of Stress : Not at all  Social Connections: Moderately Integrated (10/20/2023)   Social Connection and Isolation Panel [NHANES]    Frequency of  Communication with Friends and Family: Once a week    Frequency of Social Gatherings with Friends and Family: Never    Attends Religious Services: More than 4 times per year    Active Member of [employer] or Organizations: Yes    Attends Banker Meetings: Never    Marital Status: Married  Catering manager Violence: Unknown (06/26/2023)   Received from [facility]   HITS    Physically Hurt: Not on file    Insult or Talk Down To: Not on file    Threaten  Physical Harm: Not on file    Scream or Curse: Not on file    Family History  Problem Relation Age of Onset   Cancer Mother    Kidney disease Mother    Heart disease Father    Heart attack Father    Cancer Father        liver cancer   Diabetes Father    Obesity Father    Cancer Brother 33    Past Medical History:  Diagnosis Date   Arthritis 01/30/2023   B12 deficiency 07/27/2023   Coronary artery calcification    Diabetes mellitus without complication (HCC)    Gastric intestinal metaplasia without dysplasia 10/14/2023   Hx of adenomatous colonic polyps 10/14/2023   Hyperlipidemia    Hypertension    Melanoma (HCC)    Thyroid disease     Past Surgical History:  Procedure Laterality Date   COLONOSCOPY N/A 11/07/2014   Procedure: COLONOSCOPY;  Surgeon: Dalia Heading, MD;  Location: [facility];  Service: Gastroenterology;  Laterality: N/A;  830   FRACTURE SURGERY  06/11/2023   KNEE SURGERY Right    REPAIR OF PERONEUS BREVIS TENDON Left 06/11/2023   Procedure: REPAIR OF PERONEUS BREVIS TENOLYSIS;  Surgeon: Toni Arthurs, MD;  Location: [facility];  Service: Orthopedics;  Laterality: Left;   TENDON REPAIR Left 06/11/2023   Procedure: LEFT PERONEUS LONGUS REPAIR;  Surgeon: Toni Arthurs, MD;  Location: [facility];  Service: Orthopedics;  Laterality: Left;    Current Outpatient Medications  Medication Sig Dispense Refill   amLODipine (NORVASC) 10 MG tablet Take 1 tablet (10 mg total)  by mouth daily at 10 pm. 90 tablet 3   aspirin 81 MG tablet Take 81 mg by mouth daily.     B-D UF III MINI PEN NEEDLES 31G X 5 MM MISC USE AS DIRECTED 100 each 5   carvedilol (COREG) 12.5 MG tablet Take 1 tablet (12.5 mg total) by mouth 2 (two) times daily with a meal. 180 tablet 3   Cobalamin Combinations (B-12) 1000-400 MCG SUBL Place 1 tablet under the tongue daily. 90 tablet 3   Continuous Glucose Sensor (DEXCOM G6 SENSOR) MISC USE AS DIRECTED. NEED TO REPLACE EVERY 10 DAYS. 9 each 3   Continuous Glucose Transmitter (DEXCOM G6 TRANSMITTER) MISC USE AS DIRECTED 1 each 0   dapagliflozin propanediol (FARXIGA) 10 MG TABS tablet Take by mouth daily.     diclofenac Sodium (VOLTAREN) 1 % GEL USE AS DIRECTED 50 g 2   Evolocumab (REPATHA SURECLICK) 140 MG/ML SOAJ INJECT 140 MG INTO THE SKIN EVERY 14 (FOURTEEN) DAYS. 6 mL 3   fenofibrate micronized (LOFIBRA) 200 MG capsule daily before breakfast.     Finerenone (KERENDIA) 10 MG TABS Take 1 tablet (10 mg total) by mouth daily. 90 tablet 3   icosapent Ethyl (VASCEPA) 1 g capsule Take 2 capsules (2 g total) by mouth 2 (two) times daily. 180 capsule 32   isosorbide-hydrALAZINE (BIDIL) 20-37.5 MG tablet Take 1 tablet by mouth 3 (three) times daily. 270 tablet 3   levothyroxine (SYNTHROID) 112 MCG tablet Take 224 mcg by mouth daily.     loratadine (CLARITIN) 10 MG tablet Take 10 mg by mouth daily.     Needles & Syringes MISC Inject 1 mL into the muscle once a week. 12 pen 0   rosuvastatin (CRESTOR) 40 MG tablet Take 40 mg by mouth daily.     sertraline (ZOLOFT) 50 MG tablet Take 1 tablet (50  mg total) by mouth daily. 90 tablet 3   TRESIBA FLEXTOUCH 200 UNIT/ML FlexTouch Pen Inject 50 Units into the skin in the morning and at bedtime.     losartan (COZAAR) 100 MG tablet Take 1 tablet (100 mg total) by mouth daily at 10 pm. 90 tablet 0   No current facility-administered medications for this visit.    Allergies as of 01/25/2024 - Review Complete 01/25/2024   Allergen Reaction Noted   Pravastatin sodium       Labs ; Mr. Schoenfeld 's labs were forwarded to me by Dr. Cleon Gustin and he had a normal protein electrophoresis so-called SPEP on 07-15-2023.  No M spike observed, vitamin D level was 30.8 on the same date, ionized calcium was not normal level 5.1, PTH parathyroid hormone was intact 17 at 01-06-2024 and had in August been very low at 9.  His comprehensive metabolic panel was last performed with Dr. Cleon Gustin on 3 23-2023 and at that time his BUN was high which indicates dehydration but this is almost 2 years ago now.  He had a high glucose level which has been much better controlled since he has a Dexcom.  Creatinine was February 1 0.44 which is elevated but better than it was in August of last year.  Glomerular filtration rate is 55, potassium was 5.3 and slightly elevated, alkaline phosphatase was low at 42, hemoglobin A1c was 6.8 on February 19 of this year.  Triglycerides were elevated 232.  Total cholesterol 180 with an HDL cholesterol of 45.  It would definitely be good to increase daytime activity physical activity in the free air.  TSH was 3.5 I did not see a T3 or T4.  Microalbumin urine was not found.  This is a very good result.  Vitamin D deficiency had been diagnosed at the patient is now supplements the same is true for vitamin B12.  I would appreciate if we could obtain a total iron binding capacity, ferritin, T3 and T4 panel and vitamin B12 level or indirect a methylmalonic acid level. Hepatitis B and non- a/b core ab.     Vitals: BP 123/64   Pulse 69   Ht 6\' 3"  (1.905 m)   Wt 243 lb (110.2 kg)   BMI 30.37 kg/m  Last Weight:  Wt Readings from Last 1 Encounters:  01/25/24 243 lb (110.2 kg)   Last Height:   Ht Readings from Last 1 Encounters:  01/25/24 6\' 3"  (1.905 m)   Last BMI: @LASTBMI  Physical exam:  The patient is awake, alert and appears not in acute distress. The patient is well groomed. Head: Normocephalic, atraumatic. Neck is  supple. Mallampati 2,  neck circumference:18.5  inches. Nasal airflow congested =not fully  patent.  Retrognathia Lavone Nian is seen  seen.  Dental status:  some teeth removed, used to wear braces.  Cardiovascular:  Regular rate and cardiac rhythm by pulse,  without distended neck veins. Respiratory: Lungs are clear to auscultation.  Skin:  Without evidence of ankle edema,  had foot surgery-  Trunk: The patient's posture is erect.   NEUROLOGIC EXAM: The patient is awake and alert, oriented to place and time.   Memory subjective described as intact.  Attention span & concentration ability appears normal.  Speech is fluent,  without dysarthria, dysphonia or aphasia.  Mood and affect are appropriate.   Cranial nerves: no loss of smell or taste reported  Pupils are equal and briskly reactive to light. Funduscopic exam deferred..  Extraocular movements in vertical and  horizontal planes were intact and without nystagmus. No Diplopia. Visual fields by finger perimetry are intact. Hearing was intact to soft voice / vibration tuning fork.   Facial sensation intact to fine touch.  Facial motor strength is symmetric and tongue and uvula move midline.  Neck ROM : rotation, tilt and flexion extension were normal for age and shoulder shrug was symmetrical.    Motor exam:  Symmetric bulk, tone and ROM.   Normal tone without cog -wheeling, symmetric grip strength .   Sensory:  Fine touch, and vibration were absent in knees, feet and all toes. He felt tuning fork vibration in both hands.  Proprioception tested in the upper extremities was normal.   Coordination: Rapid alternating movements in the fingers/hands were of normal speed. Tremor of outstretched hands .  Bilateral.    Gait and station: Patient could rise unassisted from a seated position, walked without assistive device.  Toe and heel walk were deferred.  Deep tendon reflexes: in the  upper and lower extremities are reduced in amplitude.       Assessment: Total time for face to face interview and examination, for review of  images and laboratory testing, neurophysiology testing and pre-existing records, including out-of -network , was 35 minutes.    Assessment is as follows here:  1)   Neuropathy has progressed, his is a sensory primary peripheral neuropathy, not painful. It las let to more instability of gait,  slight vertigo with Romberg.  Swaying with closed eyes, no primary motor loss.   2)  This can be DM neuropathy , thyroid and B12 deficiency, all together.  Please see my rec above.   Plan:  Treatment plan and additional workup planned after today includes:   1) I like to ask dr Talmage Nap to add the following.  did not see a T3 or T4.  Microalbumin urine was not found.  This is a very good result.  Vitamin D deficiency had been diagnosed at the patient is now supplements the same is true for vitamin B12.  I would appreciate if we could obtain a total iron binding capacity, ferritin, T3 and T4 panel and vitamin B12 level or indirect a methylmalonic acid level. Hepatitis B and non- a/b core ab.   2) I would like to add alpha lipoic fatty acid.  There is no treatment for NP to give back normal sensation, and we treat painful neuropathy by creating a numbness instead .  A NCV or EMG is not needed.   3) continue Repatha.   4) please obtain a CPAP machine now that your coverage extends to these needs, it can influenzae retro-orbital pressure, morning headaches and metabolism.   Rv within 90 days of finally starting CPAP, please.    Melvyn Novas, MD

## 2024-01-25 NOTE — Assessment & Plan Note (Signed)
 Peripheral Polyneuropathy (ICD-10: G62.9) Assessment: Patient demonstrates progressive peripheral neuropathy affecting lower extremities to knee level and now progressing to hands. Clinical findings include positive Romberg test and subjective sensory disturbances. Etiology is multifactorial, with contributions from: Longstanding vitamin B12 deficiency (now corrected but neurological recovery lags behind biochemical correction) Diabetic peripheral neuropathy (despite well-controlled diabetes) Possible autoimmune neuropathy component given autoimmune diathesis Plan: Continue high-dose sublingual B12 supplementation at 5000 mcg daily Order methylmalonic acid level to assess functional B12 status despite elevated serum levels Neurological consultation to evaluate for additional neuropathy etiologies Prescribe balance exercises with eyes closed to improve proprioceptive compensation Discuss fall prevention strategies, especially in bathroom/shower environments Follow-up neurological examination in 3 months to assess progression Anticipate slow recovery from B12-related neuropathy (6-12 months)

## 2024-01-25 NOTE — Assessment & Plan Note (Signed)
 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 Plan: Rheumatology referral for evaluation of autoimmune arthritis Laboratory testing for rheumatological workup:  Rheumatoid factor (RF) Anti-cyclic citrullinated peptide antibodies (anti-CCP) Erythrocyte sedimentation rate (ESR) HLA-B27 Antinuclear antibodies (ANA) with reflex panel Consider thyroid peroxidase antibodies to confirm autoimmune thyroiditis Coordinate care between endocrinology, gastroenterology, and rheumatology Educate regarding potential future development of additional autoimmune conditions

## 2024-01-25 NOTE — Progress Notes (Signed)
 ==============================  [facility]: [phone]   -- Medical Office Visit --  Patient: Frederick Robinson.      Age: 63 y.o.       Sex:  male  Date:   01/25/2024 Today's Healthcare Provider: Lula Olszewski, MD  ==============================   CHIEF COMPLAINT: Lab results  SUBJECTIVE: Background This is a 63 y.o. male who has Hypothyroidism; Diabetes mellitus, type 2 (HCC); Hyperlipidemia associated with type 2 diabetes mellitus (HCC); Resistant hypertension; Allergic rhinitis; Former smoker; Leg injury; Family history of prostate cancer; Apathetic; CKD (chronic kidney disease); Peroneal tendon rupture, left, sequela; Pain of joint of left ankle and foot; Acquired cavovarus deformity of left foot; History of melanoma; Tear of medial meniscus of knee; Tendon rupture, post-op; Snoring; Memory loss; Macrocytic anemia; Vitamin D deficiency; Low serum parathyroid hormone (PTH); Foot lesion; B12 deficiency anemia; Hepatic steatosis; BPH (benign prostatic hyperplasia); Secondary hypertension; DM (diabetes mellitus), type 2 with neurological complications (HCC); GERD without esophagitis; Pernicious anemia; Colon polyps; Gastritis; OSA (obstructive sleep apnea); Hx of adenomatous colonic polyps; Gastric intestinal metaplasia without dysplasia; Gastric atrophy; Iron deficiency; Peripheral sensory neuropathy due to type 2 diabetes mellitus (HCC); and Autoimmune enteropathy, endocrinopathy, and susceptibility to chronic infection syndrome on their problem list.  History of Present Illness History of Present Illness Patient is a 63 year old male with a history of autoimmune hypothyroidism, pernicious anemia, atrophic gastritis, and diabetes mellitus who presents with progressive peripheral neuropathy, vertigo, and gastrointestinal symptoms. Peripheral Neuropathy and Neurological Symptoms The patient reports progressive peripheral neuropathy that has extended to  his knees and now affects his hands. He describes diminished sensation in his knees and reports significant difficulty with balance, particularly when his eyes are closed. He experiences swaying during shower activities when visual input is limited, indicating reliance on visual cues to compensate for proprioceptive deficits. The patient attributes these symptoms to a combination of long-standing vitamin B12 deficiency and diabetic neuropathy. Vitamin B12 Deficiency and Pernicious Anemia The patient has a documented history of vitamin B12 deficiency with confirmed positive intrinsic factor antibodies diagnostic of pernicious anemia. Though initially prescribed injectable B12, he reports logistical difficulties obtaining needles and has instead been taking sublingual B12 supplements at 5000 mcg daily. Recent labs show B12 levels have dramatically improved from severely deficient (52 pg/mL in August 2024) to markedly elevated (>1537 pg/mL in March 2025), though neurological symptoms persist (and have progressed). Gastrointestinal Symptoms and Concerns The patient describes nocturnal bloating severe enough to disrupt sleep. He reports discomfort particularly when lying down but denies reflux symptoms. Recent endoscopy (November 2024) confirmed atrophic gastritis with intestinal metaplasia. H. pylori testing was negative. Given his constellation of symptoms and autoimmune gastritis, the patient is concerned about small intestinal bacterial overgrowth (SIBO). Autoimmune Conditions and Emerging Arthritic Symptoms The patient has multiple autoimmune conditions including hypothyroidism and autoimmune gastritis with pernicious anemia. He now reports developing arthritic symptoms and is concerned about a possible autoimmune polyendocrine syndrome. His presentation with clustering of multiple autoimmune conditions affecting the endocrine system, gastrointestinal tract, and potentially joints, is consistent with  autoimmune polyendocrine syndrome type 3. Chronic Disease Management The patient reports well-controlled diabetes (recent A1c 6.2% from July 2024) on current regimen. Renal function shows mild impairment with eGFR of 58.44 as of November 2024. Iron studies show low ferritin (16.2) despite iron supplementation. Family History Concerns The patient expresses concern about his family history of cancer, noting his brother died from bladder and prostate cancer that metastasized. Given his  atrophic gastritis, he is particularly concerned about gastric cancer risk. Current Medications Current management includes high-dose sublingual B12, Tresiba insulin, antihypertensives including amlodipine, carvedilol, and losartan, thyroid replacement with levothyroxine 224 mcg daily, and lipid management with rosuvastatin, fenofibrate, and evolocumab. Recent medication adjustments include discontinuation of finerenone Chauncey Mann).  Visually reviewed that patient  has a past medical history of Arthritis (01/30/2023), B12 deficiency (07/27/2023), Coronary artery calcification, Diabetes mellitus without complication (HCC), Gastric intestinal metaplasia without dysplasia (10/14/2023), adenomatous colonic polyps (10/14/2023), Hyperlipidemia, Hypertension, Melanoma (HCC), and Thyroid disease. Manually updated:  Problem  Autoimmune Enteropathy, Endocrinopathy, and Susceptibility to Chronic Infection Syndrome   Verbally reviewed (per Abridge-generated extraction):   Visually reviewed and manually updated: Current Outpatient Medications on File Prior to Visit  Medication Sig   amLODipine (NORVASC) 10 MG tablet Take 1 tablet (10 mg total) by mouth daily at 10 pm.   aspirin 81 MG tablet Take 81 mg by mouth daily.   B-D UF III MINI PEN NEEDLES 31G X 5 MM MISC USE AS DIRECTED   carvedilol (COREG) 12.5 MG tablet Take 1 tablet (12.5 mg total) by mouth 2 (two) times daily with a meal.   Cobalamin Combinations (B-12) 1000-400 MCG  SUBL Place 1 tablet under the tongue daily.   Continuous Glucose Sensor (DEXCOM G6 SENSOR) MISC USE AS DIRECTED. NEED TO REPLACE EVERY 10 DAYS.   Continuous Glucose Transmitter (DEXCOM G6 TRANSMITTER) MISC USE AS DIRECTED   dapagliflozin propanediol (FARXIGA) 10 MG TABS tablet Take by mouth daily.   diclofenac Sodium (VOLTAREN) 1 % GEL USE AS DIRECTED   Evolocumab (REPATHA SURECLICK) 140 MG/ML SOAJ INJECT 140 MG INTO THE SKIN EVERY 14 (FOURTEEN) DAYS.   fenofibrate micronized (LOFIBRA) 200 MG capsule daily before breakfast.   icosapent Ethyl (VASCEPA) 1 g capsule Take 2 capsules (2 g total) by mouth 2 (two) times daily.   isosorbide-hydrALAZINE (BIDIL) 20-37.5 MG tablet Take 1 tablet by mouth 3 (three) times daily.   levothyroxine (SYNTHROID) 112 MCG tablet Take 224 mcg by mouth daily.   loratadine (CLARITIN) 10 MG tablet Take 10 mg by mouth daily.   rosuvastatin (CRESTOR) 40 MG tablet Take 40 mg by mouth daily.   sertraline (ZOLOFT) 50 MG tablet Take 1 tablet (50 mg total) by mouth daily.   TRESIBA FLEXTOUCH 200 UNIT/ML FlexTouch Pen Inject 50 Units into the skin in the morning and at bedtime.   losartan (COZAAR) 100 MG tablet Take 1 tablet (100 mg total) by mouth daily at 10 pm.   No current facility-administered medications on file prior to visit.   Medications Discontinued During This Encounter  Medication Reason   Needles & Syringes MISC    Finerenone (KERENDIA) 10 MG TABS       Objective      01/25/2024    2:09 PM 01/25/2024    1:59 PM 01/25/2024    9:06 AM  Vitals with BMI  Height  6\' 3"  6\' 3"   Weight  241 lbs 6 oz 243 lbs  BMI  30.17 30.37  Systolic 169 161 536  Diastolic 83 72 64  Pulse  53 69   Wt Readings from Last 10 Encounters:  01/25/24 241 lb 6.4 oz (109.5 kg)  01/25/24 243 lb (110.2 kg)  10/26/23 239 lb 3.2 oz (108.5 kg)  10/21/23 242 lb (109.8 kg)  10/14/23 243 lb (110.2 kg)  10/06/23 243 lb 6.4 oz (110.4 kg)  09/25/23 235 lb 6.1 oz (106.8 kg)  08/20/23  242 lb 3.2 oz (109.9 kg)  08/18/23 238 lb (108 kg)  06/22/23 240 lb (108.9 kg)   Vital signs reviewed.  Nursing notes reviewed. Weight trend reviewed. Physical Exam  Physical Exam NEUROLOGICAL: Positive Romberg test, swaying with eyes closed. Numbness up to knees and in hands General Appearance:  No acute distress appreciable.   Well-groomed, healthy-appearing male.  Well proportioned with no abnormal fat distribution.  Good muscle tone. Pulmonary:  Normal work of breathing at rest, no respiratory distress apparent. SpO2: 98 %  Musculoskeletal: All extremities are intact.  Neurological:  Awake, alert, oriented, and engaged.  No obvious focal neurological deficits or cognitive impairments.  Sensorium seems unclouded.   Speech is clear and coherent with logical content. Psychiatric:  Appropriate mood, pleasant and cooperative demeanor, thoughtful and engaged during the exam     No results found for any visits on 01/25/24. Lab on 01/22/2024  Component Date Value   PSA 01/22/2024 0.89    WBC 01/22/2024 5.1    RBC 01/22/2024 4.76    Hemoglobin 01/22/2024 14.4    HCT 01/22/2024 42.9    MCV 01/22/2024 90.1    MCHC 01/22/2024 33.5    RDW 01/22/2024 14.5    Platelets 01/22/2024 251.0    Neutrophils Relative % 01/22/2024 53.1    Lymphocytes Relative 01/22/2024 33.3    Monocytes Relative 01/22/2024 6.7    Eosinophils Relative 01/22/2024 5.7 (H)    Basophils Relative 01/22/2024 1.2    Neutro Abs 01/22/2024 2.7    Lymphs Abs 01/22/2024 1.7    Monocytes Absolute 01/22/2024 0.3    Eosinophils Absolute 01/22/2024 0.3    Basophils Absolute 01/22/2024 0.1    Vitamin B-12 01/22/2024 >1537 (H)    Folate 01/22/2024 10.4    Iron 01/22/2024 84    Transferrin 01/22/2024 338.0    Saturation Ratios 01/22/2024 17.8 (L)    Ferritin 01/22/2024 51.7    TIBC 01/22/2024 473.2 (H)   Office Visit on 10/21/2023  Component Date Value   Gastrin 10/21/2023 40    H pylori Ag, Stl 10/22/2023 Negative    Procedure visit on 10/14/2023  Component Date Value   SURGICAL PATHOLOGY 10/14/2023                     Value:SURGICAL PATHOLOGY [facility] [address] [place], [place] [address] Telephone [phone] or [phone] Fax [phone]  REPORT OF SURGICAL PATHOLOGY   Accession #: [id number] Patient Name: COLLYN, RIBAS Visit # : [id number]  MRN: [id number] Physician: Stan Head DOB/Age [birth year] (Age: 55) Gender: M Collected Date: 10/14/2023 Received Date: 10/19/2023  FINAL DIAGNOSIS       1. Surgical [P], duodenal (2nd portion) :       -  DUODENAL MUCOSA WITH NO SIGNIFICANT PATHOLOGY.       2. Surgical [P], duodenal bulb :       -  DUODENAL MUCOSA WITH NO SIGNIFICANT PATHOLOGY.       3. Surgical [P], gastric antrum :       -  ANTRAL MUCOSA WITH FEATURES OF BOTH CHEMICAL/REACTIVE GASTROPATHY AND MILD TO      MODERATE CHRONIC INACTIVE GASTRITIS WITH FOCAL INTESTINAL METAPLASIA SUGGESTIVE      OF ATROPHY.      -  AN IMMUNOHISTOCHEMICAL STAIN FOR HELICOBACTER PYLORI ORGANISMS IS PENDING AND      WILL BE REPORTED IN ADDENDUM.  4. Surgical [P], gastric body :       -  ANTALIZED OXYNTIC MUCOSA WITH EXTENSIVE INTESTINAL METAPLASIA CONSISTENT WITH      ATROPHY.      -  AN IMMUNOHISTOCHEMICAL STAIN FOR HELICOBACTER PYLORI ORGANISMS IS PENDING AND      WILL BE REPORTED IN AN ADDENDUM.       5. Surgical [P], colon, ascending, transverse, polyp (3) :       -  TUBULAR ADENOMA, FRAGMENTS       6. Surgical [P], colon, descending, polyp (3) :       -  TUBULAR ADENOMA, FRAGMENTS.      -  HYPERPLASTIC POLYP.       ELECTRONIC SIGNATURE : Francene Castle, Mark, Pathologist, Electronic Signature  MICROSCOPIC DESCRIPTION  CASE COMMENTS STAINS USED IN DIAGNOSIS: H&E H&E H&E Universal Negative Control-DAB H&E H&E H&E-2 H&E H&E-2 Stains used in diagnosis 3 H. Pylori IHC, 4 H. Pylori IHC Some of these  immunohistochemical stains may have been developed and the performance characteristics determined by [facility].  Some may not have been cleared or approved by the U.S. Food and Drug                          Administration.  The FDA has determined that such clearance or approval is not necessary.  This test is used for clinical purposes.  It should not be regarded as investigational or for research.  This laboratory is certified under the Clinical Laboratory Improvement Amendments of 1988 (CLIA-88) as qualified to perform high complexity clinical laboratory testing.  ADDENDUM Immunohistochemical stains performed on both parts 3 and 4 are negative for Helicobacter pylori organisms. Barkley Boards, Pathologist, Electronic Signature ( Signed [phone])   CLINICAL HISTORY  SPECIMEN(S) OBTAINED 1. Surgical [P], Duodenal (2nd Portion) 2. Surgical [P], Duodenal Bulb 3. Surgical [P], Gastric Antrum 4. Surgical [P], Gastric Body 5. Surgical [P], Colon, Ascending, Transverse, Polyp (3) 6. Surgical [P], Colon, Descending, Polyp (3)  SPECIMEN COMMENTS: 1. Loss of weight; B12 deficiency; iron deficiency anemia, unspecified iron deficiency anemia type; benign neoplasm of as                         cending colon; benign neoplasm of transverse colon; benign neoplasm of descending colon; benign neoplasm of sigmoid colon SPECIMEN CLINICAL INFORMATION: 1. R/O celiac sprue 2. R/O celiac sprue 3. R/O H.pylori and other autoimmune gastritis 4. R/O H.pylori and other autoimmune gastritis 5. R/O adenoma 6. R/O adenoma    Gross Description 1. Received in formalin are tan, soft tissue fragments that are submitted in toto.Number: 5, Size: 0.2 cm smallest to 0.4 cm largest, (1B) ( TA ) 2. Received in formalin are tan, soft tissue fragments that are submitted in toto.Number: 1 Size: 0.3 cm, (1B) ( TA ) 3. Received in formalin are tan, soft tissue fragments that are submitted  in toto.Number: 3, Size: 0.2 cm smallest to 0.5 cm largest, (1B) ( TA ) 4. Received in formalin are tan, soft tissue fragments that are submitted in toto.Number: 3, Size: 0.3 cm smallest to 0.4 cm largest, (1B) ( TA ) 5. Received in formalin are tan, soft tissue fragments that are submitted in toto.3                          Number: melanocytic proliferation, Size: 0.4 cm smallest to  1.1 cm largest, (1B) ( TA ) 6. Received in formalin are tan, soft tissue fragments that are submitted in toto.Number: 3, Size: 0.3 cm smallest to 0.6 cm largest = 4sec (1B) ( TA )        Report signed out from the following location(s) . [facility] [address], [place] [address] CLIA #: [id number]  [facility] [address] [place], [place] [address] CLIA #: [id number]   Appointment on 10/06/2023  Component Date Value   Vitamin B-12 10/06/2023 194 (L)    Iron 10/06/2023 115    Transferrin 10/06/2023 345.0    Saturation Ratios 10/06/2023 23.8    Ferritin 10/06/2023 16.2 (L)    TIBC 10/06/2023 483.0 (H)    Sodium 10/06/2023 140    Potassium 10/06/2023 4.6    Chloride 10/06/2023 107    CO2 10/06/2023 25    Glucose, Bld 10/06/2023 110 (H)    BUN 10/06/2023 20    Creatinine, Ser 10/06/2023 1.30    Total Bilirubin 10/06/2023 0.4    Alkaline Phosphatase 10/06/2023 48    AST 10/06/2023 22    ALT 10/06/2023 17    Total Protein 10/06/2023 7.3    Albumin 10/06/2023 4.5    GFR 10/06/2023 58.44 (L)    Calcium 10/06/2023 9.8    WBC 10/06/2023 5.9    RBC 10/06/2023 4.31    Hemoglobin 10/06/2023 13.0    HCT 10/06/2023 39.1    MCV 10/06/2023 90.5    MCHC 10/06/2023 33.3    RDW 10/06/2023 13.4    Platelets 10/06/2023 235.0    Neutrophils Relative % 10/06/2023 45.8    Lymphocytes Relative 10/06/2023 40.6    Monocytes Relative 10/06/2023 8.2    Eosinophils Relative 10/06/2023 4.3    Basophils Relative 10/06/2023 1.1    Neutro Abs 10/06/2023 2.7     Lymphs Abs 10/06/2023 2.4    Monocytes Absolute 10/06/2023 0.5    Eosinophils Absolute 10/06/2023 0.3    Basophils Absolute 10/06/2023 0.1   Lab on 08/17/2023  Component Date Value   PTH 08/17/2023 22    Calcium 08/17/2023 9.6    Gastrin 08/17/2023 25    Intrinsic Factor 08/17/2023 Positive (A)    LDL Direct 08/17/2023 73    Glucose 08/17/2023 78    BUN 08/17/2023 13    Creatinine, Ser 08/17/2023 1.24    eGFR 08/17/2023 65    BUN/Creatinine Ratio 08/17/2023 10    Sodium 08/17/2023 145 (H)    Potassium 08/17/2023 4.6    Chloride 08/17/2023 109 (H)    CO2 08/17/2023 22    Calcium 08/17/2023 9.6    Total Protein 08/17/2023 7.2    Albumin 08/17/2023 4.5    Globulin, Total 08/17/2023 2.7    Bilirubin Total 08/17/2023 0.3    Alkaline Phosphatase 08/17/2023 56    AST 08/17/2023 31    ALT 08/17/2023 25    Cholesterol, Total 08/17/2023 164    Triglycerides 08/17/2023 201 (H)    HDL 08/17/2023 50    VLDL Cholesterol Cal 08/17/2023 34    LDL Chol Calc (NIH) 08/17/2023 80    LDL CALC COMMENT: 08/17/2023 Comment    LDL/HDL Ratio 08/17/2023 1.6    Vitamin B-12 08/17/2023 316    Folate 08/17/2023 14.8   Orders Only on 07/29/2023  Component Date Value   EGFR 08/18/2023 65.0   Scanned Document on 07/27/2023  Component Date Value   A1c 05/29/2023 6.2    EGFR (Non-African Amer.) 05/29/2023 79  Creatinine, POC 05/29/2023 160.9    Albumin, Urine POC 05/29/2023 90.8    Albumin/Creatinine Ratio* 05/29/2023 56H   Scanned Document on 07/13/2023  Component Date Value   HM Hepatitis Screen 07/07/2023 Negative-Validated   Office Visit on 07/09/2023  Component Date Value   Path Review 07/09/2023     Vitamin B-12 07/09/2023 52 (L)    Folate 07/09/2023 >24.2    Methylmalonic Acid, Quant 07/09/2023 1,228 (H)   Lab on 07/06/2023  Component Date Value   VITD 07/06/2023 14.89 (L)    Color, Urine 07/06/2023 YELLOW    APPearance 07/06/2023 CLEAR    Specific Gravity, Urine 07/06/2023 1.015     pH 07/06/2023 7.0    Total Protein, Urine 07/06/2023 NEGATIVE    Urine Glucose 07/06/2023 NEGATIVE    Ketones, ur 07/06/2023 NEGATIVE    Bilirubin Urine 07/06/2023 NEGATIVE    Hgb urine dipstick 07/06/2023 NEGATIVE    Urobilinogen, UA 07/06/2023 1.0    Leukocytes,Ua 07/06/2023 NEGATIVE    Nitrite 07/06/2023 NEGATIVE    WBC, UA 07/06/2023 0-2/hpf    RBC / HPF 07/06/2023 none seen    PTH 07/06/2023 15 (L)    PSA 07/06/2023 0.54    Creatinine, Urine 07/06/2023 CANCELED    Phosphorus 07/06/2023 3.5    Microalb, Ur 07/06/2023 <0.7    Creatinine,U 07/06/2023 80.6    Microalb Creat Ratio 07/06/2023 0.9    Iron 07/06/2023 75    TIBC 07/06/2023 377    %SAT 07/06/2023 20    Ferritin 07/06/2023 44    HCV Quant Baseline 07/06/2023 HCV Not Detected    HCV log10 07/06/2023 CANCELED    Test Information 07/06/2023 Comment    HCV Genotype 07/06/2023 CANCELED    WBC 07/06/2023 4.9    RBC 07/06/2023 3.68 (L)    Hemoglobin 07/06/2023 11.4 (L)    HCT 07/06/2023 34.7 (L)    MCV 07/06/2023 94.1    MCHC 07/06/2023 33.0    RDW 07/06/2023 13.6    Platelets 07/06/2023 209.0    Neutrophils Relative % 07/06/2023 57.3    Lymphocytes Relative 07/06/2023 28.4    Monocytes Relative 07/06/2023 6.8    Eosinophils Relative 07/06/2023 6.6 (H)    Basophils Relative 07/06/2023 0.9    Neutro Abs 07/06/2023 2.8    Lymphs Abs 07/06/2023 1.4    Monocytes Absolute 07/06/2023 0.3    Eosinophils Absolute 07/06/2023 0.3    Basophils Absolute 07/06/2023 0.0    Sodium 07/06/2023 140    Potassium 07/06/2023 4.5    Chloride 07/06/2023 109    CO2 07/06/2023 22    Glucose, Bld 07/06/2023 145 (H)    BUN 07/06/2023 25 (H)    Creatinine, Ser 07/06/2023 1.22    GFR 07/06/2023 63.18    Calcium 07/06/2023 9.4   There may be more visits with results that are not included.  No image results found. Home sleep test Result Date: 10/29/2023 Melvyn Novas, MD     10/29/2023  2:14 PM   [facility]  HOME SLEEP  TEST REPORT ( by Watch PAT)  STUDY DATE:  MAIL out Device- delayed 10-29-2023 DOB:  09-Dec-1960 MRN: [id number]  ORDERING CLINICIAN: Melvyn Novas, MD REFERRING CLINICIAN: Dr Odis Hollingshead.  CLINICAL INFORMATION/HISTORY: Hypertension control issues, possibly complicated by OSA? I have the pleasure of seeing NYCERE PRESLEY 08/18/23 a right-handed male with a possible sleep disorder, we are looking for a causes of HTN.   Sleep relevant medical history: DM, Thyroid disease, HTN, B 12 deficiency, anemia.  Nocturia  1 time-2 times, Tonsillectomy at age 39,  thyroid disease.   FINDINGS:  Sleep Summary:  Total Recording Time (hours, min):    9 hours 59 minutes  Total Sleep Time (hours, min):     8 hours 34 minutes          Percent REM (%):       25%                               Respiratory Indices:  Calculated pAHI (per hour):        By AASM criteria 38.6/h                   REM pAHI:     34.2/h                                          NREM pAHI:    40.1/h                        Supine AHI:    The patient slept for the longest time in supine sleep position associated with an AHI of 60.4/h followed by 204 minutes in right lateral sleep position with an AHI of 15.1/h and 97 minutes on his left side with an AHI of 40.3/h. Snoring level reached a mean volume of 41 dB and was present for less than 20% of total sleep time.                                             Oxygen Saturation Statistics:  Oxygen Saturation (%) Mean:    95%         Minimum oxygen saturation (%):   78%       O2 Saturation Range (%):     Between a nadir of 78% with a maximum saturation of 99%                                O2 Saturation (minutes) <89%:       5 minutes Pulse Rate Statistics:  Pulse Mean (bpm):     60 bpm          Pulse Range:      Between 46 and 91 bpm.  Please note that the cardiac rhythm cannot be established by this test device.         IMPRESSION:  This HST confirms the presence of of severe all obstructive sleep apnea.  Remarkably there is a  higher apnea hypopnea index seen in non-REM sleep in comparison to REM sleep. The first 3 hours of the sleep study were more fragmented than the second half of the sleep study. Based on these data I would recommend positive airway pressure therapy for this patient as a treat snoring as well as apnea and hypoxia.  This patient's body mass index is under 30 and through the reduction of weight may benefit snoring and apnea to a minor degree but will not be enough to alleviate apnea.  RECOMMENDATION: Due to the severity of this obstructive sleep apnea I would recommend strongly positive airway pressure therapy and this should be provided  through a auto titration CPAP device by [employer] with a setting between 6 and 16 cm water pressure, 2 cm water EPR, heated humidification and interface of patient's choice and comfort.  INTERPRETING PHYSICIAN:  Melvyn Novas, MD       Assessment & Plan Autoimmune polyendocrine syndrome, type 3 (HCC) Autoimmune Polyendocrine Syndrome Type 3 (ICD-10: E31.0) Assessment: Patient demonstrates constellation of multiple autoimmune conditions including: Autoimmune thyroiditis with hypothyroidism Pernicious anemia with autoimmune gastritis Emerging arthritis symptoms suggesting rheumatological involvement This pattern is consistent with autoimmune polyendocrine syndrome type 3, which includes autoimmune thyroiditis with other autoimmune conditions excluding adrenal insufficiency. Plan: Rheumatology referral for evaluation of autoimmune arthritis Laboratory testing for rheumatological workup:  Rheumatoid factor (RF) Anti-cyclic citrullinated peptide antibodies (anti-CCP) Erythrocyte sedimentation rate (ESR) HLA-B27 Antinuclear antibodies (ANA) with reflex panel Consider thyroid peroxidase antibodies to confirm autoimmune thyroiditis Coordinate care between endocrinology, gastroenterology, and rheumatology Educate regarding potential future development of additional autoimmune  conditions Small intestinal bacterial overgrowth (SIBO) Small Intestinal Bacterial Overgrowth (SIBO) (ICD-10: K90.89) Assessment: Strong clinical suspicion for SIBO given: Nocturnal bloating and abdominal distension Known atrophic gastritis with hypochlorhydria (predisposing factor) Nutrient malabsorption consistent with laboratory findings Testing unavailable but high clinical suspicion warrants empiric treatment. Plan: Empiric therapy with Rifaximin 550 mg PO TID for 14 days Initiate low FODMAP diet with gradual reintroduction after treatment Consider prokinetic therapy if symptoms recur after antibiotic course Evaluate response to empiric therapy at 2-week follow-up Consider jejunal aspirate during next endoscopy if symptoms persist Maintain adequate vitamin D supplementation (current level low at 14.89 ng/mL) Type 2 diabetes mellitus with stage 3a chronic kidney disease, with long-term current use of insulin (HCC) Type 2 Diabetes Mellitus with Diabetic Nephropathy (ICD-10: E11.22, Z79.4) Assessment: Well-controlled diabetes with recent A1c of 6.2% (05/29/2023). Patient demonstrates evidence of diabetic nephropathy with mildly reduced eGFR of 58.44 mL/min (10/06/2023) and microalbuminuria (ACR 56 mg/g, 05/29/2023). Contributing to polyneuropathy despite good control. 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 Negative H. pylori.  Normal gastrin level of 40 pg/mL (10/21/2023). The gastric findings are consistent with autoimmune gastritis and correlate with pernicious anemia diagnosis. Intestinal metaplasia represents a preneoplastic lesion requiring surveillance. 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 Clinical findings include positive Romberg test and subjective sensory disturbances. Etiology is multifactorial, with contributions from: Longstanding vitamin B12 deficiency (now corrected but neurological recovery lags behind biochemical correction) Diabetic peripheral neuropathy (despite well-controlled diabetes) Possible autoimmune neuropathy component given autoimmune diathesis Plan: Continue high-dose sublingual B12 supplementation at 5000 mcg daily Order methylmalonic acid level to assess functional B12 status despite elevated serum levels Neurological consultation to evaluate for additional neuropathy etiologies Prescribe balance exercises with eyes closed to improve proprioceptive compensation Discuss fall prevention strategies, especially in bathroom/shower  environments Follow-up neurological examination in 3 months to assess progression Anticipate slow recovery from B12-related neuropathy (6-12 months) Pernicious anemia Pernicious Anemia (ICD-10: D51.0) Assessment: 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 B12 deficiency now fully corrected with remarkably elevated levels >1537 pg/mL (01/22/2024). Anemia has resolved with hemoglobin improved from 11.4 to 14.4 g/dL. Macrocytosis has normalized with current MCV of 90.1. Plan: Continue current sublingual B12 5000 mcg daily with excellent biochemical response No need for injectable B12 therapy given excellent response to sublingual route Monitor B12 levels every 6 months for maintenance Continue close monitoring of CBC for recurrent anemia Educate patient on need for lifelong B12 replacement Consider reducing B12 supplementation to 2500 mcg daily if levels remain significantly elevated at next check Hypothyroidism, unspecified type Hypothyroidism (ICD-10: E03.9) Assessment: Stable hypothyroidism of likely autoimmune etiology. Currently on levothyroxine 224 mcg daily. Thyroid function testing not available in recent results for review. Plan: Continue levothyroxine 224 mcg daily Check thyroid stimulating hormone (TSH), free T4 Order thyroid peroxidase antibodies to confirm autoimmune etiology Adjust dosage as needed based on laboratory results Follow up in 6-8 weeks after any dosage adjustments Iron deficiency Iron Deficiency (ICD-10: D50.9) Assessment: Persistent iron deficiency despite normal hemoglobin. Ferritin low at 51.7 ng/mL (01/22/2024) improved from 16.2 ng/mL (10/06/2023). Transferrin saturation remains low at 17.8%. Likely related to malabsorption from autoimmune gastritis. Plan: Continue current iron supplementation Recheck iron studies in 3 months Consider IV iron if oral supplementation insufficient to normalize ferritin Educate on dietary sources of iron and vitamin C to enhance absorption Monitor for symptoms of iron deficiency despite normal hemoglobin Preventative health care Health Maintenance Assessment: Patient due for preventive care measures. High-risk patient given autoimmune  conditions, preneoplastic gastric changes, and family history of cancer. Encouraged patient to maintain Annual Wellness Visit (AWV) for these. Plan: Pneumococcal vaccination (currently overdue) Annual influenza vaccination Screen for osteoporosis given risk factors Annual PSA testing given family history (most recent 0.89 ng/mL on 01/22/2024) Ophthalmology examination (overdue since 2014) Address vitamin D deficiency with supplementation (level 14.89 ng/mL, 07/06/2023) Follow-up Plan Laboratory testing today: RF, anti-CCP, ESR, HLA-B27, ANA panel, methylmalonic acid, TSH, free T4, thyroid peroxidase antibodies Continue scheduled gastroenterology follow-up on Wednesday Schedule rheumatology consultation within 2-4 weeks Return for follow-up in 1 month to assess response to SIBO treatment and review laboratory results Call if worsening neurological symptoms, new joint symptoms, or increased GI discomfort Arthritis       Orders Placed During this Encounter:   Orders Placed This Encounter  Procedures   Methylmalonic Acid   Sedimentation rate   C-reactive protein   ANA w/Reflex if Positive   Rheumatoid Factor   HLA-B27 Antigen   Celiac Disease Comprehensive Panel with Reflexes   PR BREATH HYDROGEN/METHANE TEST   Meds ordered this encounter  Medications   rifaximin (XIFAXAN) 550 MG TABS tablet    Sig: Take 1 tablet (550 mg total) by mouth 3 (three) times daily.    Dispense:  42 tablet    Refill:  0   Finerenone (KERENDIA) 20 MG TABS    Sig: Take 1 tablet (20 mg total) by mouth daily.    Dispense:  90 tablet    Refill:  1       **This document was synthesized by artificial intelligence (Abridge) using HIPAA-compliant recording of the clinical interaction;   We discussed the use of AI scribe software for clinical note transcription with the patient, who gave verbal consent to proceed.    Additional Info:  This encounter employed state-of-the-art, real-time, collaborative  documentation. The patient actively reviewed and assisted in updating their electronic medical record on a shared screen, ensuring transparency and facilitating joint problem-solving for the problem list, overview, and plan. This approach promotes accurate, informed care. The treatment plan was discussed and reviewed in detail, including medication safety, potential side effects, and all patient questions. We confirmed understanding and comfort with the plan. Follow-up instructions were established, including contacting the office for any concerns, returning if symptoms worsen, persist, or new symptoms develop, and precautions for potential emergency department visits.  # Time-Based Attestation  I spent a total of 43 minutes on the date of service providing care for this patient. This time included face-to-face and non-face-to-face activities including:  - Review of extensive medical records, including multiple laboratory tests spanning several dates, gastroenterology biopsy results, sleep study findings, and previous specialist notes - Detailed history-taking regarding progression of peripheral neuropathy symptoms, response to B12 therapy, gastrointestinal symptoms, and emerging arthritic complaints - Comprehensive physical examination with focus on neurological assessment including Romberg testing - Medical decision making for multiple complex, interrelated conditions including autoimmune polyendocrine syndrome type 3, pernicious anemia, peripheral neuropathy, suspected SIBO, and diabetic nephropathy - Ordering and interpretation of appropriate diagnostic testing including rheumatological workup, methylmalonic acid level, and thyroid function studies - Medication management including initiation of Rifaximin therapy, optimization of B12 supplementation, and restarting Chauncey Mann for renal protection - Development of a comprehensive care plan addressing multiple organ systems - Patient education regarding  diagnosis of suspected SIBO, implementation of low FODMAP diet, progression timeline for B12-related neuropathy recovery, and fall prevention strategies - Care coordination including rheumatology referral and communication with gastroenterology about ongoing management  This extended time was medically necessary due to:  1. The complex interplay between multiple autoimmune conditions (hypothyroidism, pernicious anemia, autoimmune gastritis) requiring detailed assessment of disease activity and progression 2. The need to address the progressive peripheral neuropathy with multiple contributing etiologies (B12 deficiency and diabetes) 3. New recognition of autoimmune polyendocrine syndrome pattern requiring comprehensive diagnostic approach 4. Management decisions for suspected SIBO requiring empiric treatment in the setting of autoimmune gastritis 5. Coordination of care (thorough documentation) for multiple specialties (gastroenterology, rheumatology, endocrinology) 6. The need for comprehensive patient education regarding complex treatment regimen including antibiotic therapy, dietary modifications, and safety considerations 7. Management of pre-malignant intestinal metaplasia in a patient with family history of cancer  The complexity of this patient's interrelated multisystem autoimmune conditions, progressive neurological symptoms, and gastrointestinal complications required this extended time to ensure appropriate evaluation, management, and coordination of care.

## 2024-01-25 NOTE — Assessment & Plan Note (Signed)
 Hypothyroidism (ICD-10: E03.9) Assessment: Stable hypothyroidism of likely autoimmune etiology. Currently on levothyroxine 224 mcg daily. Thyroid function testing not available in recent results for review. Plan: Continue levothyroxine 224 mcg daily Check thyroid stimulating hormone (TSH), free T4 Order thyroid peroxidase antibodies to confirm autoimmune etiology Adjust dosage as needed based on laboratory results Follow up in 6-8 weeks after any dosage adjustments

## 2024-01-25 NOTE — Assessment & Plan Note (Signed)
 Pernicious Anemia (ICD-10: D51.0) Assessment: Autoimmune pernicious anemia confirmed by positive intrinsic factor antibodies (08/17/2023). B12 deficiency now fully corrected with remarkably elevated levels >1537 pg/mL (01/22/2024). Anemia has resolved with hemoglobin improved from 11.4 to 14.4 g/dL. Macrocytosis has normalized with current MCV of 90.1. Plan: Continue current sublingual B12 5000 mcg daily with excellent biochemical response No need for injectable B12 therapy given excellent response to sublingual route Monitor B12 levels every 6 months for maintenance Continue close monitoring of CBC for recurrent anemia Educate patient on need for lifelong B12 replacement Consider reducing B12 supplementation to 2500 mcg daily if levels remain significantly elevated at next check

## 2024-01-25 NOTE — Assessment & Plan Note (Signed)
 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 Negative H. pylori. Normal gastrin level of 40 pg/mL (10/21/2023). The gastric findings are consistent with autoimmune gastritis and correlate with pernicious anemia diagnosis. Intestinal metaplasia represents a preneoplastic lesion requiring surveillance. Plan: GI follow-up for management of atrophic gastritis and intestinal metaplasia Surveillance endoscopy as recommended by gastroenterology (typically every 3-5 years) Monitor gastrin levels annually No indication for acid suppression given hypochlorhydria from autoimmune gastritis Educate regarding increased gastric cancer risk and surveillance importance Nutrition consultation for dietary modifications appropriate for malabsorption

## 2024-01-25 NOTE — Telephone Encounter (Signed)
 Patient's review of lab results/notes confirmed.

## 2024-01-25 NOTE — Assessment & Plan Note (Signed)
 Iron Deficiency (ICD-10: D50.9) Assessment: Persistent iron deficiency despite normal hemoglobin. Ferritin low at 51.7 ng/mL (01/22/2024) improved from 16.2 ng/mL (10/06/2023). Transferrin saturation remains low at 17.8%. Likely related to malabsorption from autoimmune gastritis. Plan: Continue current iron supplementation Recheck iron studies in 3 months Consider IV iron if oral supplementation insufficient to normalize ferritin Educate on dietary sources of iron and vitamin C to enhance absorption Monitor for symptoms of iron deficiency despite normal hemoglobin

## 2024-01-26 ENCOUNTER — Other Ambulatory Visit: Payer: Self-pay | Admitting: Neurology

## 2024-01-26 LAB — C-REACTIVE PROTEIN: CRP: <1 mg/dL (ref 0.5–20.0)

## 2024-01-26 LAB — ANA W/REFLEX IF POSITIVE: Anti Nuclear Antibody (ANA): NEGATIVE

## 2024-01-26 LAB — SEDIMENTATION RATE: Sed Rate: 15 mm/h (ref 0–20)

## 2024-01-27 ENCOUNTER — Other Ambulatory Visit: Payer: Self-pay | Admitting: Neurology

## 2024-01-27 ENCOUNTER — Telehealth: Payer: Self-pay | Admitting: Internal Medicine

## 2024-01-27 ENCOUNTER — Ambulatory Visit: Payer: 59 | Admitting: Internal Medicine

## 2024-01-27 MED ORDER — ALPHA-LIPOIC ACID 600 MG PO TABS: 600.0000 mg | ORAL_TABLET | Freq: Every day | ORAL | 5 refills | Status: AC

## 2024-01-27 NOTE — Telephone Encounter (Signed)
 Good afternoon Dr. Leone Payor,   Patient called stating that he would not be in for his appointment with you this afternoon at 3:30 due to being in the ER with his wife.   Patient stated he will call back at a later time to reschedule.

## 2024-01-27 NOTE — Telephone Encounter (Signed)
 Note attached to Rx from pharmacy     Please advise

## 2024-01-27 NOTE — Progress Notes (Signed)
 I changed the prescription to 600 mg  alpha-lipoic fatty acids with 660 mg biotin  per pharmacy request.  CD

## 2024-01-28 LAB — HLA-B27 ANTIGEN: HLA-B27 Antigen: NEGATIVE

## 2024-01-28 LAB — METHYLMALONIC ACID, SERUM: Methylmalonic Acid, Quant: 165 nmol/L (ref 69–390)

## 2024-01-28 LAB — RHEUMATOID FACTOR: Rheumatoid fact SerPl-aCnc: <10 [IU]/mL (ref <14)

## 2024-01-28 LAB — CELIAC DISEASE COMPREHENSIVE PANEL WITH REFLEXES
(tTG) Ab, IgA: <1 U/mL
Immunoglobulin A: 149 mg/dL (ref 70–320)

## 2024-01-29 ENCOUNTER — Ambulatory Visit: Payer: 59 | Admitting: Internal Medicine

## 2024-01-31 ENCOUNTER — Encounter: Payer: Self-pay | Admitting: Internal Medicine

## 2024-01-31 DIAGNOSIS — M064 Inflammatory polyarthropathy: Secondary | ICD-10-CM | POA: Insufficient documentation

## 2024-01-31 NOTE — Progress Notes (Signed)
 Reviewed laboratory results from 01/22/2024. Key findings: B12 levels markedly elevated (>1537 pg/mL) showing excellent response to supplementation; hemoglobin normalized (14.4 g/dL) with resolution of anemia; ferritin improved to normal range (51.7 ng/mL) but transferrin saturation remains low (17.8%); PSA slightly increased but remains normal (0.89 ng/mL). Plan: Continue current B12 and iron supplementation with potential dose adjustment at next visit; monitor for continued neurologic improvement; recheck iron studies in 3 months. Detailed explanation sent via Patient Message.

## 2024-02-01 NOTE — Telephone Encounter (Signed)
 Left vm for patient to call the office back or check my chart for lab results/notes.

## 2024-02-02 NOTE — Telephone Encounter (Signed)
 Informed patient of lab results/notes. Patient wants to come in for labs before his next scheduled visit on 06/22/24 for annual physical. What labs do you want ordered aside from the iron labs repeated? He can be scheduled for an lab visit once the labs are ordered.

## 2024-02-03 ENCOUNTER — Other Ambulatory Visit: Payer: Self-pay

## 2024-02-03 ENCOUNTER — Telehealth: Payer: Self-pay

## 2024-02-03 ENCOUNTER — Other Ambulatory Visit (HOSPITAL_COMMUNITY): Payer: Self-pay

## 2024-02-03 ENCOUNTER — Encounter: Payer: Self-pay | Admitting: Internal Medicine

## 2024-02-03 DIAGNOSIS — E611 Iron deficiency: Secondary | ICD-10-CM

## 2024-02-03 DIAGNOSIS — D519 Vitamin B12 deficiency anemia, unspecified: Secondary | ICD-10-CM

## 2024-02-03 DIAGNOSIS — Z0000 Encounter for general adult medical examination without abnormal findings: Secondary | ICD-10-CM

## 2024-02-03 DIAGNOSIS — E538 Deficiency of other specified B group vitamins: Secondary | ICD-10-CM

## 2024-02-03 NOTE — Telephone Encounter (Signed)
 Pharmacy Patient Advocate Encounter   Received notification from CoverMyMeds that prior authorization for REPATHA is required/requested.   Insurance verification completed.   The patient is insured through [facility] .   Per test claim: PA required; PA submitted to above mentioned insurance via CoverMyMeds Key/confirmation #/EOC [facility] Status is pending

## 2024-02-03 NOTE — Addendum Note (Signed)
 Addended by: Donzetta Starch on: 02/03/2024 09:28 AM   Modules accepted: Orders

## 2024-02-03 NOTE — Telephone Encounter (Signed)
 Scheduled patient for a lab visit on 6/10 (for repeat of iron lab in three months) and 7/31 (labs prior to annual exam). Ordered labs for both visits.

## 2024-02-03 NOTE — Telephone Encounter (Signed)
 Patient stated that he would get the other labs done with his Endocrinologist.

## 2024-02-04 ENCOUNTER — Other Ambulatory Visit (HOSPITAL_COMMUNITY): Payer: Self-pay

## 2024-02-04 DIAGNOSIS — D229 Melanocytic nevi, unspecified: Secondary | ICD-10-CM | POA: Diagnosis not present

## 2024-02-04 DIAGNOSIS — L821 Other seborrheic keratosis: Secondary | ICD-10-CM | POA: Diagnosis not present

## 2024-02-05 ENCOUNTER — Other Ambulatory Visit (HOSPITAL_COMMUNITY): Payer: Self-pay

## 2024-02-05 NOTE — Telephone Encounter (Signed)
 Pharmacy Patient Advocate Encounter  Received notification from [facility] that Prior Authorization for REPATHA has been APPROVED from 02/04/24 to 02/03/25

## 2024-02-07 DIAGNOSIS — R197 Diarrhea, unspecified: Secondary | ICD-10-CM | POA: Diagnosis not present

## 2024-02-16 ENCOUNTER — Telehealth: Payer: Self-pay | Admitting: *Deleted

## 2024-02-16 ENCOUNTER — Encounter: Payer: Self-pay | Admitting: *Deleted

## 2024-02-16 ENCOUNTER — Other Ambulatory Visit: Payer: Self-pay | Admitting: Cardiology

## 2024-02-16 DIAGNOSIS — I10 Essential (primary) hypertension: Secondary | ICD-10-CM

## 2024-02-16 MED ORDER — LOSARTAN POTASSIUM 100 MG PO TABS: 100.0000 mg | ORAL_TABLET | Freq: Every day | ORAL | 0 refills | Status: DC

## 2024-02-16 NOTE — Telephone Encounter (Signed)
 Which medication requires PA? The key did not pull up in Cover My Meds.

## 2024-02-16 NOTE — Telephone Encounter (Signed)
 Copied from CRM [id number]. Topic: General - Other >> Feb 15, 2024  3:28 PM Makayla J wrote: Reason for CRM: Pill meds prior authorization management is requesting for a PA to be submitted on cover my meds for the pt  Key:[id number]

## 2024-02-24 ENCOUNTER — Other Ambulatory Visit (HOSPITAL_COMMUNITY): Payer: Self-pay

## 2024-03-01 ENCOUNTER — Ambulatory Visit: Payer: 59 | Admitting: Gastroenterology

## 2024-03-01 ENCOUNTER — Encounter: Payer: Self-pay | Admitting: Internal Medicine

## 2024-03-01 ENCOUNTER — Encounter: Payer: Self-pay | Admitting: Cardiology

## 2024-03-01 DIAGNOSIS — E785 Hyperlipidemia, unspecified: Secondary | ICD-10-CM

## 2024-03-01 DIAGNOSIS — I10 Essential (primary) hypertension: Secondary | ICD-10-CM

## 2024-03-01 DIAGNOSIS — Z794 Long term (current) use of insulin: Secondary | ICD-10-CM

## 2024-03-01 DIAGNOSIS — K638219 Small intestinal bacterial overgrowth, unspecified: Secondary | ICD-10-CM

## 2024-03-01 DIAGNOSIS — E310 Autoimmune polyglandular failure: Secondary | ICD-10-CM

## 2024-03-01 DIAGNOSIS — R453 Demoralization and apathy: Secondary | ICD-10-CM

## 2024-03-01 MED ORDER — SERTRALINE HCL 50 MG PO TABS: 50.0000 mg | ORAL_TABLET | Freq: Every day | ORAL | 3 refills | Status: DC

## 2024-03-01 MED ORDER — KERENDIA 20 MG PO TABS: 20.0000 mg | ORAL_TABLET | Freq: Every day | ORAL | 3 refills | Status: DC

## 2024-03-01 MED ORDER — RIFAXIMIN 550 MG PO TABS: 550.0000 mg | ORAL_TABLET | Freq: Three times a day (TID) | ORAL | 3 refills | Status: DC

## 2024-03-01 MED ORDER — ICOSAPENT ETHYL 1 G PO CAPS: 2.0000 g | ORAL_CAPSULE | Freq: Two times a day (BID) | ORAL | 32 refills | Status: DC

## 2024-03-01 NOTE — Telephone Encounter (Signed)
 MyChart secure digital messaging clinical encounter  Chief Complaint:  Patient requesting assistance with medication prior authorizations and prescription renewals following change to [employer].  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 Diabetic nephropathy (E11.22) requiring Chauncey Mann for kidney protection    - Current evidence supports use of finerenone in patients with type 2 diabetes and chronic kidney disease for reduction of progression of kidney disease and cardiovascular events    - Patient has documented microalbuminuria and reduced eGFR qualifying for therapy per ADA guidelines  2. 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 Medication management requiring insurance prior authorization [phone])    - New insurance requiring clinical documentation for specialty medications    - Patient requesting 90-day mail order  prescriptions for ongoing maintenance medications  Plan: 1. Complete prior authorization for Kerendia 10mg  with supporting clinical documentation    - Document reduced eGFR, microalbuminuria, and diabetic diagnosis    - Reference current ADA and KDIGO guidelines supporting use in diabetic nephropathy  2. Provide clinical documentation for Xifaxan 550mg  TID for SIBO    - Document ongoing symptoms and clinical indication    - Include supporting literature on empiric treatment for presumptive SIBO    - Specify 14-day treatment course  3. Convert prescriptions to 90-day supply for [employer] mail order:    - Sertraline 50mg  daily #90 with 3 refills    - Icosapent Ethyl 1g BID #360 with 3 refills    - Kerendia 10mg  daily #42 with 3 refills (pending approval)  4. Patient education provided:    - Explained prior authorization process and timeline    - Reviewed warning signs requiring urgent medical attention    - Discussed importance of medication adherence  Medical Decision Making: Moderate complexity decision making required due to: - Multiple chronic conditions requiring coordination of pharmaceutical care - Insurance formulary navigation requiring clinical documentation - Evaluation of ongoing symptoms of SIBO requiring documentation of medical necessity - Assessment of appropriate medication dosing for renal protection  Current Medical Guidelines Reference: Per 2023 American Diabetes Association Standards of Care and 2022 KDIGO guidelines, finerenone is recommended for patients with type 2 diabetes and CKD with persistent albuminuria despite optimal ACE inhibitor or ARB therapy to reduce CKD progression and cardiovascular events.  Please see the MyChart message reply(ies) for my assessment and plan.   This patient gave consent for this Medical Advice Message and is aware that it may result in a bill to [employer], as well as the possibility of receiving a bill for  a co-payment or deductible. They are an established patient, but are not seeking medical advice exclusively about a problem  treated during an in person or video visit in the last seven days. I did not recommend an in person or video visit within seven days of my reply.    I spent a total of 15 minutes cumulative time within 7 days through [employer]. Anthon Kins, MD     ============================== Medical Necessity Documentation:  1. Kerendia (finerenone) 10mg  daily:    - Diagnosis: Type 2 Diabetes with Diabetic Nephropathy (E11.22)    - Clinical evidence: eGFR 58.44 mL/min (10/06/2023) and microalbuminuria (ACR 56 mg/g, 05/29/2023)    - Current treatment: Patient on maximum tolerated doses of ARB therapy (losartan 100mg  daily)    - Guideline support: 2022 KDIGO Clinical Practice Guidelines recommend finerenone for patients with T2DM, eGFR >=25 mL/min/1.7m, and persistent albuminuria despite maximum tolerated ARB/ACE therapy    - Expected outcome: Reduction in progression of kidney disease and associated cardiovascular mortality    - Previous response: Patient previously tolerated and responded to Kerendia 20mg  with stabilization of renal function  2. 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)    - Clinical evidence:       * Documented symptoms: nocturnal bloating, abdominal distension, alternating diarrhea/constipation      * Predisposing conditions: Autoimmune atrophic gastritis with hypochlorhydria confirmed by biopsy (10/14/2023)      * Laboratory findings: Documented nutrient malabsorption with vitamin B12 deficiency and iron deficiency    - Treatment rationale: Empiric therapy indicated per ACG Clinical Guidelines when testing unavailable but high clinical suspicion exists    - Course of therapy: 14-day course at 550mg  TID (standard evidence-based dosing for SIBO)    - Expected outcome: Resolution of documented  symptoms and improvement in nutrient absorption

## 2024-03-02 ENCOUNTER — Other Ambulatory Visit: Payer: Self-pay

## 2024-03-02 ENCOUNTER — Telehealth: Payer: Self-pay

## 2024-03-02 ENCOUNTER — Other Ambulatory Visit (HOSPITAL_COMMUNITY): Payer: Self-pay

## 2024-03-02 DIAGNOSIS — E310 Autoimmune polyglandular failure: Secondary | ICD-10-CM

## 2024-03-02 DIAGNOSIS — K638219 Small intestinal bacterial overgrowth, unspecified: Secondary | ICD-10-CM

## 2024-03-02 MED ORDER — REPATHA SURECLICK 140 MG/ML ~~LOC~~ SOAJ: 140.0000 mg | SUBCUTANEOUS | 3 refills | Status: DC

## 2024-03-02 MED ORDER — ISOSORB DINITRATE-HYDRALAZINE 20-37.5 MG PO TABS: 1.0000 | ORAL_TABLET | Freq: Three times a day (TID) | ORAL | 3 refills | Status: AC

## 2024-03-02 MED ORDER — AMLODIPINE BESYLATE 10 MG PO TABS: 10.0000 mg | ORAL_TABLET | Freq: Every day | ORAL | 3 refills | Status: AC

## 2024-03-02 MED ORDER — LOSARTAN POTASSIUM 100 MG PO TABS: 100.0000 mg | ORAL_TABLET | Freq: Every day | ORAL | 3 refills | Status: AC

## 2024-03-02 MED ORDER — CARVEDILOL 12.5 MG PO TABS: 12.5000 mg | ORAL_TABLET | Freq: Two times a day (BID) | ORAL | 3 refills | Status: DC

## 2024-03-02 MED ORDER — RIFAXIMIN 550 MG PO TABS: 550.0000 mg | ORAL_TABLET | Freq: Three times a day (TID) | ORAL | 0 refills | Status: DC

## 2024-03-02 NOTE — Telephone Encounter (Signed)
 I received a request to do a prior [place] for Kerendia 20MG  tablets. I do not see on his chart where he has chronic kidney disease which is what this drug is FDA approved for. Please consider changing therapies.

## 2024-03-02 NOTE — Telephone Encounter (Signed)
 Please see pharmacy/RX Prior Auth team message and advise

## 2024-03-02 NOTE — Telephone Encounter (Signed)
 Last read by Dorethia Ganser. at 5:18PM on 03/01/2024

## 2024-03-02 NOTE — Telephone Encounter (Signed)
 Rx for 14 day supply sent to pharmacy pt requested

## 2024-03-02 NOTE — Telephone Encounter (Signed)
 Pharmacy Patient Advocate Encounter  Received notification from [employer] that Prior Authorization for Xifaxan 550MG  tablets has been APPROVED from 03/02/24 to 03/16/24. Ran test claim, Copay is $0. This test claim was processed through [facility]- copay amounts may vary at other pharmacies due to pharmacy/plan contracts, or as the patient moves through the different stages of their insurance plan.   PA #/Case ID/Reference #: [id number]   *spoke with patient and [facility] [place] on [address]

## 2024-03-08 ENCOUNTER — Other Ambulatory Visit (HOSPITAL_COMMUNITY): Payer: Self-pay

## 2024-03-08 NOTE — Telephone Encounter (Signed)
 Pharmacy Patient Advocate Encounter  Received notification from [facility] that Prior Authorization for Kerendia  20MG  tablets has been APPROVED from 03/07/24 to 03/07/25. Ran test claim, Copay is $0. This test claim was processed through [facility]- copay amounts may vary at other pharmacies due to pharmacy/plan contracts, or as the patient moves through the different stages of their insurance plan.   PA #/Case ID/Reference #: [id number]

## 2024-03-14 ENCOUNTER — Other Ambulatory Visit: Payer: Self-pay

## 2024-03-14 DIAGNOSIS — N1831 Chronic kidney disease, stage 3a: Secondary | ICD-10-CM

## 2024-03-14 MED ORDER — KERENDIA 20 MG PO TABS: 20.0000 mg | ORAL_TABLET | Freq: Every day | ORAL | 3 refills | Status: DC

## 2024-03-14 NOTE — Telephone Encounter (Signed)
 Patient called back requesting a 30 day supply be sent to his local pharmacy. Patient states it will be covered and be no cost to him if supply is changed from 90 days to 30 days.

## 2024-03-15 DIAGNOSIS — L821 Other seborrheic keratosis: Secondary | ICD-10-CM | POA: Diagnosis not present

## 2024-03-15 DIAGNOSIS — Z85820 Personal history of malignant melanoma of skin: Secondary | ICD-10-CM | POA: Diagnosis not present

## 2024-03-15 DIAGNOSIS — D229 Melanocytic nevi, unspecified: Secondary | ICD-10-CM | POA: Diagnosis not present

## 2024-03-15 DIAGNOSIS — L905 Scar conditions and fibrosis of skin: Secondary | ICD-10-CM | POA: Diagnosis not present

## 2024-03-15 DIAGNOSIS — D225 Melanocytic nevi of trunk: Secondary | ICD-10-CM | POA: Diagnosis not present

## 2024-03-15 DIAGNOSIS — L814 Other melanin hyperpigmentation: Secondary | ICD-10-CM | POA: Diagnosis not present

## 2024-03-15 DIAGNOSIS — L578 Other skin changes due to chronic exposure to nonionizing radiation: Secondary | ICD-10-CM | POA: Diagnosis not present

## 2024-03-28 ENCOUNTER — Other Ambulatory Visit: Payer: Self-pay | Admitting: Pharmacist

## 2024-03-28 DIAGNOSIS — I2510 Atherosclerotic heart disease of native coronary artery without angina pectoris: Secondary | ICD-10-CM | POA: Insufficient documentation

## 2024-03-30 ENCOUNTER — Ambulatory Visit: Admitting: Gastroenterology

## 2024-03-30 ENCOUNTER — Encounter: Payer: Self-pay | Admitting: Gastroenterology

## 2024-03-30 VITALS — BP 130/60 | HR 69 | Ht 75.0 in | Wt 244.2 lb

## 2024-03-30 DIAGNOSIS — K31A19 Gastric intestinal metaplasia without dysplasia, unspecified site: Secondary | ICD-10-CM | POA: Diagnosis not present

## 2024-03-30 DIAGNOSIS — R194 Change in bowel habit: Secondary | ICD-10-CM

## 2024-03-30 DIAGNOSIS — K2940 Chronic atrophic gastritis without bleeding: Secondary | ICD-10-CM

## 2024-03-30 DIAGNOSIS — E538 Deficiency of other specified B group vitamins: Secondary | ICD-10-CM

## 2024-03-30 DIAGNOSIS — R140 Abdominal distension (gaseous): Secondary | ICD-10-CM | POA: Diagnosis not present

## 2024-03-30 DIAGNOSIS — K638219 Small intestinal bacterial overgrowth, unspecified: Secondary | ICD-10-CM

## 2024-03-30 MED ORDER — OMEPRAZOLE 40 MG PO CPDR: 40.0000 mg | DELAYED_RELEASE_CAPSULE | Freq: Every day | ORAL | 3 refills | Status: DC

## 2024-03-30 NOTE — Patient Instructions (Signed)
 We have sent the following medications to your pharmacy for you to pick up at your convenience: Omeprazole 40 mg daily 30-60 minutes before dinner.   _______________________________________________________  If your blood pressure at your visit was 140/90 or greater, please contact your primary care physician to follow up on this.  _______________________________________________________  If you are age 64 or older, your body mass index should be between 23-30. Your Body mass index is 30.52 kg/m. If this is out of the aforementioned range listed, please consider follow up with your Primary Care Provider.  If you are age 2 or younger, your body mass index should be between 19-25. Your Body mass index is 30.52 kg/m. If this is out of the aformentioned range listed, please consider follow up with your Primary Care Provider.   ________________________________________________________  The [facility] providers would like to encourage you to use MYCHART to communicate with providers for non-urgent requests or questions.  Due to long hold times on the telephone, sending your provider a message by [facility] may be a faster and more efficient way to get a response.  Please allow 48 business hours for a response.  Please remember that this is for non-urgent requests.  _______________________________________________________

## 2024-03-30 NOTE — Progress Notes (Signed)
 03/30/2024 Frederick Robinson Neamiah Sciarra [id number] Jun 15, 1961   HISTORY OF PRESENT ILLNESS: This is a 63 year old male was a patient of Dr. Jadene Maxwell.  He has past medical history of diabetes, melanoma, hypertension, hyperlipidemia, thyroid  disease, B12 deficiency/pernicious anemia with positive intrinsic antibodies.  Due to significant B12 deficiency that was replaced by his PCP, but wanted to proceed with investigation.  Was seen in November 2020 for a scheduled for EGD and colonoscopy as below.  Colonoscopy 09/2023:  - Six 2 to 8 mm polyps in the sigmoid colon, in the descending colon, in the transverse colon and in the ascending colon, removed with a cold snare. Resected and retrieved. - The examination was otherwise normal on direct and retroflexion views. - The examined portion of the ileum was normal.  EGD 09/2023:  - Gastritis. Biopsied. [place] protocol used antrum and body in separate jars - The examination was otherwise normal. - Biopsies were taken with a cold forceps for evaluation of celiac disease.   1. Surgical [P], duodenal (2nd portion) :      -  DUODENAL MUCOSA WITH NO SIGNIFICANT PATHOLOGY.       2. Surgical [P], duodenal bulb :      -  DUODENAL MUCOSA WITH NO SIGNIFICANT PATHOLOGY.       3. Surgical [P], gastric antrum :      -  ANTRAL MUCOSA WITH FEATURES OF BOTH CHEMICAL/REACTIVE GASTROPATHY AND MILD TO      MODERATE CHRONIC INACTIVE GASTRITIS WITH FOCAL INTESTINAL METAPLASIA SUGGESTIVE      OF ATROPHY.      -  AN IMMUNOHISTOCHEMICAL STAIN FOR HELICOBACTER PYLORI ORGANISMS IS PENDING AND      WILL BE REPORTED IN ADDENDUM.       4. Surgical [P], gastric body :      -  ANTALIZED OXYNTIC MUCOSA WITH EXTENSIVE INTESTINAL METAPLASIA CONSISTENT WITH      ATROPHY.      -  AN IMMUNOHISTOCHEMICAL STAIN FOR HELICOBACTER PYLORI ORGANISMS IS PENDING AND      WILL BE REPORTED IN AN ADDENDUM.       5. Surgical [P], colon, ascending, transverse, polyp (3) :      -  TUBULAR  ADENOMA, FRAGMENTS       6. Surgical [P], colon, descending, polyp (3) :      -  TUBULAR ADENOMA, FRAGMENTS.      -  HYPERPLASTIC POLYP.   Hpylori negative.  He is now here for follow-up from these procedures.  Has several questions about results.  Not on PPI therapy.  Tells me that he was having abdominal bloating with some alternating bowel habits.  His PCP empirically treated him with some Xifaxan  for 2 weeks.  He just completed that but is feeling better.  He is an Art gallery manager.  Has a 60-year-old son.   Past Medical History:  Diagnosis Date   Arthritis 01/30/2023   B12 deficiency 07/27/2023   Coronary artery calcification    Diabetes mellitus without complication (HCC)    Gastric intestinal metaplasia without dysplasia 10/14/2023   Hx of adenomatous colonic polyps 10/14/2023   Hyperlipidemia    Hypertension    Melanoma (HCC)    Thyroid  disease    Past Surgical History:  Procedure Laterality Date   COLONOSCOPY N/A 11/07/2014   Procedure: COLONOSCOPY;  Surgeon: Beau Bound, MD;  Location: [facility];  Service: Gastroenterology;  Laterality: N/A;  830   FRACTURE SURGERY  06/11/2023   KNEE SURGERY Right  REPAIR OF PERONEUS BREVIS TENDON Left 06/11/2023   Procedure: REPAIR OF PERONEUS BREVIS TENOLYSIS;  Surgeon: Amada Backer, MD;  Location: [facility];  Service: Orthopedics;  Laterality: Left;   TENDON REPAIR Left 06/11/2023   Procedure: LEFT PERONEUS LONGUS REPAIR;  Surgeon: Amada Backer, MD;  Location: [facility];  Service: Orthopedics;  Laterality: Left;    reports that he quit smoking about 14 years ago. His smoking use included cigarettes and cigars. He started smoking about 39 years ago. He has a 50 pack-year smoking history. He has never used smokeless tobacco. He reports current alcohol use of about 16.0 standard drinks of alcohol per week. He reports that he does not use drugs. family history includes Bladder Cancer in his brother;  Cancer in his father and mother; Cancer (age of onset: 70) in his brother; Diabetes in his father; Heart attack in his father; Heart disease in his father; Kidney disease in his mother; Obesity in his father. Allergies  Allergen Reactions   Pravastatin Sodium     REACTION: HIVES      Outpatient Encounter Medications as of 03/30/2024  Medication Sig   Alpha-Lipoic Acid 600 MG TABS Take 600 mg by mouth daily. If a formulation with BIOTIN  exists , please dispense 600 mg Alpha lipoic fatty acid and 660 mg biotin  per tab or capsule.   amLODipine  (NORVASC ) 10 MG tablet Take 1 tablet (10 mg total) by mouth daily at 10 pm.   aspirin 81 MG tablet Take 81 mg by mouth daily.   B-D UF III MINI PEN NEEDLES 31G X 5 MM MISC USE AS DIRECTED   carvedilol  (COREG ) 12.5 MG tablet Take 1 tablet (12.5 mg total) by mouth 2 (two) times daily with a meal.   Cobalamin Combinations (B-12) 1000-400 MCG SUBL Place 1 tablet under the tongue daily.   Continuous Glucose Sensor (DEXCOM G6 SENSOR) MISC USE AS DIRECTED. NEED TO REPLACE EVERY 10 DAYS.   Continuous Glucose Transmitter (DEXCOM G6 TRANSMITTER) MISC USE AS DIRECTED   dapagliflozin propanediol (FARXIGA) 10 MG TABS tablet Take by mouth daily.   diclofenac  Sodium (VOLTAREN ) 1 % GEL USE AS DIRECTED   Evolocumab  (REPATHA  SURECLICK) 140 MG/ML SOAJ Inject 140 mg into the skin every 14 (fourteen) days.   fenofibrate micronized (LOFIBRA) 200 MG capsule daily before breakfast.   Finerenone  (KERENDIA ) 20 MG TABS Take 1 tablet (20 mg total) by mouth daily.   icosapent  Ethyl (VASCEPA ) 1 g capsule Take 2 capsules (2 g total) by mouth 2 (two) times daily.   isosorbide-hydrALAZINE  (BIDIL) 20-37.5 MG tablet Take 1 tablet by mouth 3 (three) times daily.   levothyroxine (SYNTHROID) 112 MCG tablet Take 224 mcg by mouth daily.   loratadine (CLARITIN) 10 MG tablet Take 10 mg by mouth daily.   losartan  (COZAAR ) 100 MG tablet Take 1 tablet (100 mg total) by mouth daily at 10 pm.    rosuvastatin (CRESTOR) 40 MG tablet Take 40 mg by mouth daily.   sertraline  (ZOLOFT ) 50 MG tablet Take 1 tablet (50 mg total) by mouth daily.   TRESIBA FLEXTOUCH 200 UNIT/ML FlexTouch Pen Inject 50 Units into the skin in the morning and at bedtime.   No facility-administered encounter medications on file as of 03/30/2024.    REVIEW OF SYSTEMS  : All other systems reviewed and negative except where noted in the History of Present Illness.   PHYSICAL EXAM: BP 130/60   Pulse 69   Ht 6\' 3"  (1.905 m)  Wt 244 lb 3.2 oz (110.8 kg)   BMI 30.52 kg/m  General: Well developed white male in no acute distress Head: Normocephalic and atraumatic Eyes:  Sclerae anicteric, conjunctiva pink. Ears: Normal auditory acuity Musculoskeletal: Symmetrical with no gross deformities  Skin: No lesions on visible extremities Neurological: Alert oriented x 4, grossly non-focal Psychological:  Alert and cooperative. Normal mood and affect  ASSESSMENT AND PLAN: *Gastritis with focal intestinal metaplasia/atrophy: Repeat EGD recommended in 3 years.  He is not on PPI therapy, I think he would benefit from this.  Will start omeprazole  40 mg daily.  Prescription sent to pharmacy.  Answered patient's questions to the best of my ability. *Vitamin B12 deficiency/pernicious anemia: Likely related to the atrophic gastritis.  He was on replacement per his PCP. *Bloating with alternating bowel habits: PCP empirically treated him with Xifaxan  550 mg 3 times daily.  He just completed that so hard to know where he will stand as far as ongoing resolution of symptoms, he does feel better at this point, however.     CC:  Anthon Kins, MD

## 2024-04-05 ENCOUNTER — Encounter: Payer: Self-pay | Admitting: Gastroenterology

## 2024-04-05 DIAGNOSIS — R140 Abdominal distension (gaseous): Secondary | ICD-10-CM | POA: Insufficient documentation

## 2024-04-21 ENCOUNTER — Telehealth: Payer: Self-pay

## 2024-04-21 NOTE — Telephone Encounter (Signed)
 Auth Submission: DENIED Site of care: Site of care: [facility] Payer: [employer] commercial Medication & CPT/J Code(s) submitted: Leqvio (Inclisiran) [phone]  Authorization has been DENIED because "the plan has a specific exclusion for the requested service or treatment." Leqvio is not a covered benefit under the patients plan.

## 2024-04-22 ENCOUNTER — Telehealth: Payer: Self-pay

## 2024-04-22 NOTE — Telephone Encounter (Signed)
 Patient advised.

## 2024-04-22 NOTE — Telephone Encounter (Signed)
 I think he has misunderstood.  Was trying to transition him from Repatha  to Leqvio to further optimize his LDL levels given comorbidities that he has.  However after reading the thread looks like Leqvio is off the table due to cost.  And he is already on fenofibrate and Vascepa  for triglycerides.  Are you aware of any new trials that he may be participating for hypertriglyceridemia that he may benefit from?  Dilia Alemany [place], DO, FACC

## 2024-04-22 NOTE — Telephone Encounter (Signed)
-----   Message from [place] D. Zehr sent at 04/22/2024  9:04 AM EDT ----- Regarding: FW: atrophic gastritis and PPI Hello.  I saw this guy in clinic the other week and started him on a PPI.  Can you please let him know that after further discussion with Dr. Willy Harvest he said that he does not typically treat atrophic/autoimmune gastritis with a PPI unless they are having classic heartburn/reflux type symptoms.  I do not believe that he was having those when I saw him so let's go ahead and stop his PPI that I had started.  Thank you,  Jess ----- Message ----- From: Kenney Peacemaker, MD Sent: 04/06/2024   7:54 PM EDT To: Cortez Dines, PA-C Subject: atrophic gastritis and PPI                     So he has atrophic gastritis - which can be caused by a PPI and also those patients tend to make less acid already.  So I typically do not treat them with a PPI unless there is some other overriding reason.  I could not tell if he was having upper GI sxs - sounds like he got Xifaxan  w/ some benefit.  CEG

## 2024-04-26 ENCOUNTER — Other Ambulatory Visit

## 2024-04-26 NOTE — Telephone Encounter (Signed)
Pt responding to you

## 2024-05-06 ENCOUNTER — Encounter: Payer: Self-pay | Admitting: Internal Medicine

## 2024-05-06 ENCOUNTER — Other Ambulatory Visit (INDEPENDENT_AMBULATORY_CARE_PROVIDER_SITE_OTHER)

## 2024-05-06 DIAGNOSIS — E538 Deficiency of other specified B group vitamins: Secondary | ICD-10-CM | POA: Diagnosis not present

## 2024-05-06 DIAGNOSIS — E611 Iron deficiency: Secondary | ICD-10-CM | POA: Diagnosis not present

## 2024-05-06 DIAGNOSIS — Z0000 Encounter for general adult medical examination without abnormal findings: Secondary | ICD-10-CM | POA: Diagnosis not present

## 2024-05-06 DIAGNOSIS — D519 Vitamin B12 deficiency anemia, unspecified: Secondary | ICD-10-CM

## 2024-05-06 LAB — COMPREHENSIVE METABOLIC PANEL WITH GFR
ALT: 34 U/L (ref 0–53)
AST: 36 U/L (ref 0–37)
Albumin: 4.1 g/dL (ref 3.5–5.2)
Alkaline Phosphatase: 33 U/L — ABNORMAL LOW (ref 39–117)
BUN: 17 mg/dL (ref 6–23)
CO2: 25 meq/L (ref 19–32)
Calcium: 8.9 mg/dL (ref 8.4–10.5)
Chloride: 105 meq/L (ref 96–112)
Creatinine, Ser: 1.43 mg/dL (ref 0.40–1.50)
GFR: 51.91 mL/min — ABNORMAL LOW (ref >60.00)
Glucose, Bld: 304 mg/dL — ABNORMAL HIGH (ref 70–99)
Potassium: 4.4 meq/L (ref 3.5–5.1)
Sodium: 138 meq/L (ref 135–145)
Total Bilirubin: 0.4 mg/dL (ref 0.2–1.2)
Total Protein: 6.6 g/dL (ref 6.0–8.3)

## 2024-05-06 LAB — LIPID PANEL
Cholesterol: 147 mg/dL (ref 0–200)
HDL: 36.1 mg/dL — ABNORMAL LOW (ref >39.00)
LDL Cholesterol: 35 mg/dL (ref 0–99)
NonHDL: 111
Total CHOL/HDL Ratio: 4
Triglycerides: 380 mg/dL — ABNORMAL HIGH (ref 0.0–149.0)
VLDL: 76 mg/dL — ABNORMAL HIGH (ref 0.0–40.0)

## 2024-05-06 LAB — TSH: TSH: 0.33 u[IU]/mL — ABNORMAL LOW (ref 0.35–5.50)

## 2024-05-06 LAB — CBC WITH DIFFERENTIAL/PLATELET
Basophils Absolute: 0 10*3/uL (ref 0.0–0.1)
Basophils Relative: 1 % (ref 0.0–3.0)
Eosinophils Absolute: 0.2 10*3/uL (ref 0.0–0.7)
Eosinophils Relative: 5 % (ref 0.0–5.0)
HCT: 38.6 % — ABNORMAL LOW (ref 39.0–52.0)
Hemoglobin: 13.1 g/dL (ref 13.0–17.0)
Lymphocytes Relative: 34.6 % (ref 12.0–46.0)
Lymphs Abs: 1.3 10*3/uL (ref 0.7–4.0)
MCHC: 33.9 g/dL (ref 30.0–36.0)
MCV: 92.7 fl (ref 78.0–100.0)
Monocytes Absolute: 0.3 10*3/uL (ref 0.1–1.0)
Monocytes Relative: 7.5 % (ref 3.0–12.0)
Neutro Abs: 2 10*3/uL (ref 1.4–7.7)
Neutrophils Relative %: 51.9 % (ref 43.0–77.0)
Platelets: 189 10*3/uL (ref 150.0–400.0)
RBC: 4.16 Mil/uL — ABNORMAL LOW (ref 4.22–5.81)
RDW: 13.3 % (ref 11.5–15.5)
WBC: 3.9 10*3/uL — ABNORMAL LOW (ref 4.0–10.5)

## 2024-05-06 LAB — IBC + FERRITIN
Ferritin: 84.8 ng/mL (ref 22.0–322.0)
Iron: 97 ug/dL (ref 42–165)
Saturation Ratios: 28.5 % (ref 20.0–50.0)
TIBC: 340.2 ug/dL (ref 250.0–450.0)
Transferrin: 243 mg/dL (ref 212.0–360.0)

## 2024-05-06 LAB — B12 AND FOLATE PANEL
Folate: 9.5 ng/mL (ref >5.9)
Vitamin B-12: >1500 pg/mL — ABNORMAL HIGH (ref 211–911)

## 2024-05-07 ENCOUNTER — Ambulatory Visit: Payer: Self-pay | Admitting: Internal Medicine

## 2024-05-30 DIAGNOSIS — Z87891 Personal history of nicotine dependence: Secondary | ICD-10-CM | POA: Diagnosis not present

## 2024-05-30 DIAGNOSIS — I2510 Atherosclerotic heart disease of native coronary artery without angina pectoris: Secondary | ICD-10-CM | POA: Diagnosis not present

## 2024-05-30 DIAGNOSIS — N1831 Chronic kidney disease, stage 3a: Secondary | ICD-10-CM | POA: Diagnosis not present

## 2024-05-30 DIAGNOSIS — I129 Hypertensive chronic kidney disease with stage 1 through stage 4 chronic kidney disease, or unspecified chronic kidney disease: Secondary | ICD-10-CM | POA: Diagnosis not present

## 2024-05-30 DIAGNOSIS — K2970 Gastritis, unspecified, without bleeding: Secondary | ICD-10-CM | POA: Diagnosis not present

## 2024-05-30 DIAGNOSIS — E785 Hyperlipidemia, unspecified: Secondary | ICD-10-CM | POA: Diagnosis not present

## 2024-05-30 DIAGNOSIS — Z794 Long term (current) use of insulin: Secondary | ICD-10-CM | POA: Diagnosis not present

## 2024-05-30 DIAGNOSIS — F320 Major depressive disorder, single episode, mild: Secondary | ICD-10-CM | POA: Diagnosis not present

## 2024-05-30 DIAGNOSIS — E1142 Type 2 diabetes mellitus with diabetic polyneuropathy: Secondary | ICD-10-CM | POA: Diagnosis not present

## 2024-05-30 DIAGNOSIS — Z79890 Hormone replacement therapy (postmenopausal): Secondary | ICD-10-CM | POA: Diagnosis not present

## 2024-05-30 DIAGNOSIS — Z7984 Long term (current) use of oral hypoglycemic drugs: Secondary | ICD-10-CM | POA: Diagnosis not present

## 2024-05-30 DIAGNOSIS — Z85820 Personal history of malignant melanoma of skin: Secondary | ICD-10-CM | POA: Diagnosis not present

## 2024-06-10 ENCOUNTER — Other Ambulatory Visit: Payer: Self-pay

## 2024-06-10 DIAGNOSIS — Z0000 Encounter for general adult medical examination without abnormal findings: Secondary | ICD-10-CM

## 2024-06-10 DIAGNOSIS — D519 Vitamin B12 deficiency anemia, unspecified: Secondary | ICD-10-CM

## 2024-06-10 DIAGNOSIS — N1831 Chronic kidney disease, stage 3a: Secondary | ICD-10-CM

## 2024-06-10 DIAGNOSIS — E538 Deficiency of other specified B group vitamins: Secondary | ICD-10-CM

## 2024-06-16 ENCOUNTER — Other Ambulatory Visit (INDEPENDENT_AMBULATORY_CARE_PROVIDER_SITE_OTHER)

## 2024-06-16 DIAGNOSIS — Z13220 Encounter for screening for lipoid disorders: Secondary | ICD-10-CM

## 2024-06-16 DIAGNOSIS — Z0000 Encounter for general adult medical examination without abnormal findings: Secondary | ICD-10-CM

## 2024-06-16 DIAGNOSIS — Z125 Encounter for screening for malignant neoplasm of prostate: Secondary | ICD-10-CM

## 2024-06-16 DIAGNOSIS — D519 Vitamin B12 deficiency anemia, unspecified: Secondary | ICD-10-CM

## 2024-06-16 DIAGNOSIS — E538 Deficiency of other specified B group vitamins: Secondary | ICD-10-CM

## 2024-06-16 LAB — CBC WITH DIFFERENTIAL/PLATELET
Basophils Absolute: 0 K/uL (ref 0.0–0.1)
Basophils Relative: 0.8 % (ref 0.0–3.0)
Eosinophils Absolute: 0.3 K/uL (ref 0.0–0.7)
Eosinophils Relative: 6.5 % — ABNORMAL HIGH (ref 0.0–5.0)
HCT: 41.9 % (ref 39.0–52.0)
Hemoglobin: 13.7 g/dL (ref 13.0–17.0)
Lymphocytes Relative: 41.3 % (ref 12.0–46.0)
Lymphs Abs: 1.7 K/uL (ref 0.7–4.0)
MCHC: 32.6 g/dL (ref 30.0–36.0)
MCV: 96.7 fl (ref 78.0–100.0)
Monocytes Absolute: 0.3 K/uL (ref 0.1–1.0)
Monocytes Relative: 8.4 % (ref 3.0–12.0)
Neutro Abs: 1.7 K/uL (ref 1.4–7.7)
Neutrophils Relative %: 43 % (ref 43.0–77.0)
Platelets: 201 K/uL (ref 150.0–400.0)
RBC: 4.34 Mil/uL (ref 4.22–5.81)
RDW: 13.9 % (ref 11.5–15.5)
WBC: 4 K/uL (ref 4.0–10.5)

## 2024-06-16 LAB — TSH: TSH: 14.29 u[IU]/mL — ABNORMAL HIGH (ref 0.35–5.50)

## 2024-06-16 LAB — COMPREHENSIVE METABOLIC PANEL WITH GFR
ALT: 23 U/L (ref 0–53)
AST: 27 U/L (ref 0–37)
Albumin: 4.6 g/dL (ref 3.5–5.2)
Alkaline Phosphatase: 36 U/L — ABNORMAL LOW (ref 39–117)
BUN: 28 mg/dL — ABNORMAL HIGH (ref 6–23)
CO2: 26 meq/L (ref 19–32)
Calcium: 9.4 mg/dL (ref 8.4–10.5)
Chloride: 108 meq/L (ref 96–112)
Creatinine, Ser: 1.73 mg/dL — ABNORMAL HIGH (ref 0.40–1.50)
GFR: 41.27 mL/min — ABNORMAL LOW (ref >60.00)
Glucose, Bld: 115 mg/dL — ABNORMAL HIGH (ref 70–99)
Potassium: 4.4 meq/L (ref 3.5–5.1)
Sodium: 143 meq/L (ref 135–145)
Total Bilirubin: 0.4 mg/dL (ref 0.2–1.2)
Total Protein: 7.1 g/dL (ref 6.0–8.3)

## 2024-06-16 LAB — IBC PANEL
Iron: 91 ug/dL (ref 42–165)
Saturation Ratios: 22.5 % (ref 20.0–50.0)
TIBC: 404.6 ug/dL (ref 250.0–450.0)
Transferrin: 289 mg/dL (ref 212.0–360.0)

## 2024-06-16 LAB — B12 AND FOLATE PANEL
Folate: 11.7 ng/mL (ref >5.9)
Vitamin B-12: 817 pg/mL (ref 211–911)

## 2024-06-16 LAB — LIPID PANEL
Cholesterol: 157 mg/dL (ref 0–200)
HDL: 61.5 mg/dL (ref >39.00)
LDL Cholesterol: 51 mg/dL (ref 0–99)
NonHDL: 95.86
Total CHOL/HDL Ratio: 3
Triglycerides: 222 mg/dL — ABNORMAL HIGH (ref 0.0–149.0)
VLDL: 44.4 mg/dL — ABNORMAL HIGH (ref 0.0–40.0)

## 2024-06-16 LAB — PSA: PSA: 0.78 ng/mL (ref 0.10–4.00)

## 2024-06-19 ENCOUNTER — Ambulatory Visit: Payer: Self-pay | Admitting: Internal Medicine

## 2024-06-22 ENCOUNTER — Encounter: Admitting: Internal Medicine

## 2024-06-23 ENCOUNTER — Other Ambulatory Visit: Payer: Self-pay

## 2024-06-23 DIAGNOSIS — E1122 Type 2 diabetes mellitus with diabetic chronic kidney disease: Secondary | ICD-10-CM

## 2024-06-23 DIAGNOSIS — E039 Hypothyroidism, unspecified: Secondary | ICD-10-CM

## 2024-06-23 DIAGNOSIS — E1169 Type 2 diabetes mellitus with other specified complication: Secondary | ICD-10-CM

## 2024-06-23 DIAGNOSIS — E310 Autoimmune polyglandular failure: Secondary | ICD-10-CM

## 2024-06-23 NOTE — Telephone Encounter (Signed)
 Tried to call pt back about labs no answer left message for pt to call office back about lab work.

## 2024-07-01 ENCOUNTER — Other Ambulatory Visit: Payer: Self-pay | Admitting: Medical Genetics

## 2024-07-05 ENCOUNTER — Other Ambulatory Visit (HOSPITAL_COMMUNITY)

## 2024-07-05 ENCOUNTER — Other Ambulatory Visit (HOSPITAL_COMMUNITY)
Admission: RE | Admit: 2024-07-05 | Discharge: 2024-07-05 | Disposition: A | Discharge status: Home / Self Care | Source: Ambulatory Visit | Attending: Medical Genetics | Admitting: Medical Genetics

## 2024-07-07 ENCOUNTER — Ambulatory Visit: Admitting: Internal Medicine

## 2024-07-07 ENCOUNTER — Telehealth: Payer: Self-pay

## 2024-07-07 ENCOUNTER — Encounter: Payer: Self-pay | Admitting: Internal Medicine

## 2024-07-07 ENCOUNTER — Other Ambulatory Visit: Payer: Self-pay | Admitting: Internal Medicine

## 2024-07-07 VITALS — BP 138/78 | HR 73 | Temp 98.0°F | Ht 75.0 in | Wt 238.6 lb

## 2024-07-07 DIAGNOSIS — K638219 Small intestinal bacterial overgrowth, unspecified: Secondary | ICD-10-CM

## 2024-07-07 DIAGNOSIS — J3089 Other allergic rhinitis: Secondary | ICD-10-CM

## 2024-07-07 DIAGNOSIS — E1122 Type 2 diabetes mellitus with diabetic chronic kidney disease: Secondary | ICD-10-CM

## 2024-07-07 DIAGNOSIS — Z794 Long term (current) use of insulin: Secondary | ICD-10-CM

## 2024-07-07 DIAGNOSIS — N1831 Chronic kidney disease, stage 3a: Secondary | ICD-10-CM

## 2024-07-07 DIAGNOSIS — E039 Hypothyroidism, unspecified: Secondary | ICD-10-CM

## 2024-07-07 DIAGNOSIS — Z0001 Encounter for general adult medical examination with abnormal findings: Secondary | ICD-10-CM | POA: Diagnosis not present

## 2024-07-07 DIAGNOSIS — K2940 Chronic atrophic gastritis without bleeding: Secondary | ICD-10-CM

## 2024-07-07 DIAGNOSIS — Z87891 Personal history of nicotine dependence: Secondary | ICD-10-CM

## 2024-07-07 DIAGNOSIS — E538 Deficiency of other specified B group vitamins: Secondary | ICD-10-CM

## 2024-07-07 DIAGNOSIS — H60549 Acute eczematoid otitis externa, unspecified ear: Secondary | ICD-10-CM

## 2024-07-07 DIAGNOSIS — E1149 Type 2 diabetes mellitus with other diabetic neurological complication: Secondary | ICD-10-CM

## 2024-07-07 DIAGNOSIS — E1169 Type 2 diabetes mellitus with other specified complication: Secondary | ICD-10-CM | POA: Diagnosis not present

## 2024-07-07 DIAGNOSIS — N179 Acute kidney failure, unspecified: Secondary | ICD-10-CM

## 2024-07-07 DIAGNOSIS — E785 Hyperlipidemia, unspecified: Secondary | ICD-10-CM | POA: Diagnosis not present

## 2024-07-07 DIAGNOSIS — Z122 Encounter for screening for malignant neoplasm of respiratory organs: Secondary | ICD-10-CM

## 2024-07-07 LAB — RENAL FUNCTION PANEL
Albumin: 4.7 g/dL (ref 3.5–5.2)
BUN: 23 mg/dL (ref 6–23)
CO2: 26 meq/L (ref 19–32)
Calcium: 9.8 mg/dL (ref 8.4–10.5)
Chloride: 104 meq/L (ref 96–112)
Creatinine, Ser: 1.62 mg/dL — ABNORMAL HIGH (ref 0.40–1.50)
GFR: 44.64 mL/min — ABNORMAL LOW (ref >60.00)
Glucose, Bld: 123 mg/dL — ABNORMAL HIGH (ref 70–99)
Phosphorus: 3.9 mg/dL (ref 2.3–4.6)
Potassium: 4.5 meq/L (ref 3.5–5.1)
Sodium: 139 meq/L (ref 135–145)

## 2024-07-07 MED ORDER — TRIAMCINOLONE ACETONIDE 0.1 % EX CREA: 1.0000 | TOPICAL_CREAM | Freq: Two times a day (BID) | CUTANEOUS | 0 refills | Status: DC

## 2024-07-07 MED ORDER — RIFAXIMIN 200 MG PO TABS
400.0000 mg | ORAL_TABLET | Freq: Three times a day (TID) | ORAL | 3 refills | Status: DC
Start: 2024-07-07 — End: 2024-08-16

## 2024-07-07 NOTE — Assessment & Plan Note (Signed)
 Chronic kidney disease with slow decline in kidney function and recent decrease in GFR. Discussed potential lab artifact and impact of thyroid  dysfunction on kidney function. Order cystatin C test for more accurate assessment. Perform urine and blood tests for monitoring.

## 2024-07-07 NOTE — Telephone Encounter (Signed)
 Lung Cancer Screening Narrative/Criteria Questionnaire (Cigarette Smokers Only- No Cigars/Pipes/vapes)   Frederick Robinson.   SDMV:07/15/2024 at 9:30 am with [name]        August 27, 1961               LDCT: 07/20/2024 at 5:30 pm at [facility]    63 y.o.   Phone: [phone]  Lung Screening Narrative (confirm age 64-77 yrs Medicare / 50-80 yrs Private pay insurance)   Insurance information:[employer]na   Referring Provider:Wert   This screening involves an initial phone call with a team member from our program. It is called a shared decision making visit. The initial meeting is required by  insurance and Medicare to make sure you understand the program. This appointment takes about 15-20 minutes to complete. You will complete the screening scan at your scheduled date/time.  This scan takes about 5-10 minutes to complete. You can eat and drink normally before and after the scan.  Criteria questions for Lung Cancer Screening:   Are you a current or former smoker? Former Age began smoking: 12   If you are a former smoker, what year did you quit smoking? Quit 2000 and started back 2018-2021(within 15 yrs)   To calculate your smoking history, I need an accurate estimate of how many packs of cigarettes you smoked per day and for how many years. (Not just the number of PPD you are now smoking)   Years smoking 29 x Packs per day 2 = Pack years 58   (at least 20 pack yrs)   (Make sure they understand that we need to know how much they have smoked in the past, not just the number of PPD they are smoking now)  Do you have a personal history of cancer?  Yes - (type and when diagnosed - 5 yrs cancer free) Melanoma    Do you have a family history of cancer? Yes  (cancer type and and relative) Mother with kidney and kidney cancer. Father had liver cancer. Brother had bladder and prostate cancer. Grandfather had lung cancer.   Are you coughing up blood?  No  Have you had unexplained weight loss of 15 lbs or more in the  last 6 months? No  It looks like you meet all criteria.  When would be a good time for us  to schedule you for this screening?   Additional information: N/A

## 2024-07-07 NOTE — Assessment & Plan Note (Signed)
 SIBO with symptoms of gas and bloating, particularly at night. Discussed possibility of recurrence and need for further treatment. Prescribe rifaximin  with multiple refills for potential recurrence. Monitor symptoms and adjust treatment as necessary.

## 2024-07-07 NOTE — Assessment & Plan Note (Signed)
 Gave AVS allergy handout  Allergic rhinitis with current symptoms. History of COVID-19 with temporary loss of smell. ENT referral planned for further evaluation. Provide allergy management strategies for symptom control. ENT referral for further evaluation.

## 2024-07-07 NOTE — Patient Instructions (Addendum)
 VISIT SUMMARY: Today, you had a follow-up appointment to review your kidney function and thyroid  levels. We discussed your chronic kidney disease, hypothyroidism, high triglycerides, SIBO, peripheral neuropathy, vitamin B12 deficiency, autoimmune gastritis, eczema, toenail fungus, and allergic rhinitis. We also reviewed your general health maintenance, including your stable A1c levels, vision, and dental health.  YOUR PLAN: -CHRONIC KIDNEY DISEASE: Chronic kidney disease is a condition where your kidneys gradually lose function. We discussed the recent decrease in your GFR and the potential impact of thyroid  dysfunction on your kidney function. We will perform a cystatin C test for a more accurate assessment and continue monitoring with urine and blood tests.  -HYPOTHYROIDISM: Hypothyroidism is when your thyroid  gland doesn't produce enough hormones. You are currently on Levoxine, Taurine, and occasionally Synthroid. We discussed your dosage adjustments and the fluctuations in your thyroid  levels. Continue with your current medication regimen.  -HIGH TRIGLYCERIDES: High triglycerides are a type of fat in your blood that can increase your risk of heart disease. Despite being on Repatha , your triglycerides remain above 200. We will contact your insurance company regarding the denial of alternative medication and continue your current treatment with Repatha .  -SMALL INTESTINAL BACTERIAL OVERGROWTH (SIBO): SIBO is a condition where excess bacteria grow in the small intestine, causing gas and bloating. We discussed the possibility of recurrence and prescribed rifaximin  with multiple refills. Monitor your symptoms and adjust treatment as necessary.  -PERIPHERAL NEUROPATHY: Peripheral neuropathy is nerve damage that causes numbness, especially in your toes. Your diabetes is well controlled with an A1c under 6.5. Perform daily self-checks for cuts or ulcers on your feet and consider an annual visit to a  podiatrist for foot care.  -VITAMIN B12 DEFICIENCY: Vitamin B12 deficiency can cause anemia and nerve problems. Your current regimen of 5000 mcg every other day is adequate. Continue with your current B12 supplementation.  -AUTOIMMUNE GASTRITIS: Autoimmune gastritis is a condition where your immune system attacks the stomach lining. It is associated with SIBO. We discussed your weight loss and dietary changes. Continue with the prescribed rifaximin  for SIBO treatment and monitor your weight.  -ECZEMATOUS DERMATITIS: Eczematous dermatitis is a skin condition causing itchy and inflamed patches. You have it on your wrist and ear canal. We prescribed triamcinolone  cream for the affected areas and suggested changing your watch band to prevent further irritation.  -ONYCHOMYCOSIS (TOENAIL FUNGUS): Onychomycosis is a fungal infection of the toenails. We will continue to monitor this condition.  -ALLERGIC RHINITIS: Allergic rhinitis is an allergic reaction causing sneezing, congestion, and a runny nose. We discussed allergy management strateg ies and planned an ENT referral for further evaluation.  INSTRUCTIONS: Please schedule an eye appointment as it has been over a year since your last visit. Ensure you maintain your dental follow-ups. Choose a Medicare plan that covers annual physicals. Continue with your current treatments and follow the prescribed medication regimens. Monitor your symptoms and report any significant changes. Consider an annual visit to a podiatrist for foot care. Follow up with the ENT specialist as planned.  Building Your Long-Term Health Plan  During today's preventive visit, we covered a variety of important health checks to help you stay on top of your well-being.  We also discussed strategies to maintain your health and identified some areas that might benefit from further exploration.   Preventive care visits like today's are designed to be proactive, but sometimes additional  attention may be needed.  Rest assured, we're here for you.  If these areas require further evaluation  or management, we'd be happy to schedule a separate, focused appointment to address them in detail.  Addressing Next Steps  [x]   Follow-up Visit: To ensure we address any unresolved issues and continue monitoring your overall health, we recommend scheduling a follow-up appointment in 1 year for your next preventive care visit. If you experience any new problems, need to discuss any medical concerns, or your condition worsens before then, please don't hesitate to call our office to schedule an appointment or seek emergency care as needed.  [x]   Preventive Measures: Maintaining healthy habits plays a crucial role in overall wellness. We recommend considering these tips: [x]   Regular appointments with dental and vision professionals [x]   Nightly nasal saline mist to keep sinuses clear [x]   Consistent toothbrushing to maintain oral health [x]   Using an app like SnoreLab to track sleep quality [x]   Routine checks of blood pressure and heart rate [x]   Medical Information: In some instances, we may require additional medical information from other providers to create a comprehensive picture of your health. If applicable, we can provide a medical information release form at the front desk for you to sign, allowing us  to gather these records. [x]   Lab Tests: If any lab tests were ordered today, scheduling them within a week of your visit helps ensure the best possible insurance coverage.  Planning Follow Up to Work on a Problem? Make the Most of Our Focused (20 minute) Appointments  [x]   Clearly state your top concerns at the beginning of the visit to focus our discussion [x]   If you anticipate you will need more time, please inform the front desk during scheduling - we can book multiple appointments in the same week. [x]   If you have transportation problems- use our convenient video appointments or ask  about transportation support. [x]   We can get down to business faster if you use MyChart to update information before the visit and submit non-urgent questions before your visit. Thank you for taking the time to provide details through MyChart.  Let our nurse know and she can import this information into your encounter documents.  Arrival and Wait Times  [x]   Arriving on time ensures that everyone receives prompt attention. [x]   Early morning (8a) and afternoon (1p) appointments tend to have shortest wait times. [x]   Unfortunately, we cannot delay appointments for late arrivals or hold slots during phone calls.  Bring to Your Next Appointment:  [x]   Medications: Please bring all your medication bottles to your next appointment to ensure we have an accurate record of your prescriptions. [x]   Health Diaries: If you're monitoring any health conditions at home, keeping a diary of your readings can be very helpful for discussions at your next appointment.  Reviewing Your Records  [x]   Review your attached preventive care information at the end of these patient instructions. [x]   Review this early draft of your clinical encounter notes below and the final encounter summary tomorrow on MyChart after its been completed.      Getting Answers and Following Up  [x]   Simple Questions & Concerns: For quick questions or basic follow-up after your visit, reach us  at [phone] or MyChart messaging. [x]   Complex Concerns: If your concern is more complex, scheduling an appointment might be best. Discuss this with the staff to find the most suitable option. [x]   Lab & Imaging Results: We'll contact you directly if results are abnormal or you don't use MyChart. Most normal results will be on MyChart  within 2-3 business days, with a review message from Dr. Jesus. Haven't heard back in 2 weeks? Need results sooner? Contact us  at [phone]. [x]   Referrals: Our referral coordinator will manage  specialist referrals. The specialist's office should contact you within 2 weeks to schedule an appointment. Call us  if you haven't heard from them after 2 weeks.  Staying Connected  [x]   MyChart: Activate your MyChart for the fastest way to access results and message us . See the last page of this paperwork for instructions on how to activate.  Billing  [x]   X-ray & Lab Orders: These are billed by separate companies. Contact the invoicing company directly for questions or concerns. [x]   Visit Charges: Discuss any billing inquiries with our administrative services team.  Your Satisfaction Matters  [x]   Share Your Experience: We strive for your satisfaction! If you have any complaints, or preferably compliments, please let Dr. Jesus know directly or contact our Practice Administrators, Manuelita Rubin or [employer], by asking at the front desk.                 Next Steps  [x]   Schedule Follow-Up:  We recommend a follow-up appointment in 1 year for your next wellness visit.  If you develop any new problems, want to address any medical issues, or your condition worsens before then, please call us  for an appointment or seek emergency care. [x]   Preventive Care:  Make sure to keep regular appointments with dental and vision professionals, use nightly nasal saline mist sprays to keep your sinuses clear and toothbrushing to protect your teeth. Use Snore[employer] App or other app to track your sleep quality. Check blood pressure and heart rate routinely. [x]   Medical Information Release:  For any relevant medical information we don't have, please sign a release form at the front desk so we can obtain it for your records. [x]   Lab Tests:  Schedule any lab tests from today for within a week to ensure best insurance coverage.    Making the Most of Our Focused (20 minute) Appointments:  [x]   Clearly state your top concerns at the beginning of the visit to focus our discussion [x]   If you  anticipate you will need more time, please inform the front desk during scheduling - we can book multiple appointments in the same week. [x]   If you have transportation problems- use our convenient video appointments or ask about transportation support. [x]   We can get down to business faster if you use MyChart to update information before the visit and submit non-urgent questions before your visit. Thank you for taking the time to provide details through MyChart.  Let our nurse know and she can import this information into your encounter documents.  Arrival and Wait Times: [x]   Arriving on time ensures that everyone receives prompt attention. [x]   Early morning (8a) and afternoon (1p) appointments tend to have shortest wait times. [x]   Unfortunately, we cannot delay appointments for late arrivals or hold slots during phone calls.  Bring to Your Next Appointment  [x]   Medications: Please bring all your medication bottles to your next appointment to ensure we have an accurate record of your prescriptions. [x]   Health Diaries: If you're monitoring any health conditions at home, keeping a diary of your readings can be very helpful for discussions at your next appointment.  Reviewing Your Records  [x]   Review your attached preventive care information at the end of these patient instructions. [x]   Review this early  draft of your clinical encounter notes below and the final encounter summary tomorrow on MyChart after its been completed.   Encounter for annual general medical examination with abnormal findings in adult  Allergic rhinitis due to other allergic trigger, unspecified seasonality -     Ambulatory referral to ENT  Eczema of external auditory canal -     Ambulatory referral to ENT  Acute renal failure superimposed on stage 3a chronic kidney disease, unspecified acute renal failure type (HCC) -     Protein / creatinine ratio, urine; Future -     Cystatin C with Glomerular Filtration  Rate, Estimated (eGFR); Future -     Basic metabolic panel with GFR; Future -     Triamcinolone  Acetonide; Apply 1 Application topically 2 (two) times daily.  Dispense: 30 g; Refill: 0  History of smoking 25-50 pack years -     Ambulatory Referral for Lung Cancer Scre  Small intestinal bacterial overgrowth (SIBO) -     rifAXIMin ; Take 2 tablets (400 mg total) by mouth 3 (three) times daily.  Dispense: 60 tablet; Refill: 3  Hypothyroidism, unspecified type -     T4, free -     T4, free; Future  Type 2 diabetes mellitus with stage 3a chronic kidney disease, with long-term current use of insulin (HCC) -     Renal function panel  Hyperlipidemia associated with type 2 diabetes mellitus (HCC) -     Renal function panel  Stage 3a chronic kidney disease (HCC)  DM (diabetes mellitus), type 2 with neurological complications (HCC)  B12 deficiency  Autoimmune gastritis     Getting Answers and Following Up  [x]   Simple Questions & Concerns: For quick questions or basic follow-up after your visit, reach us  at [phone] or MyChart messaging. [x]   Complex Concerns: If your concern is more complex, scheduling an appointment might be best. Discuss this with the staff to find the most suitable option. [x]   Lab & Imaging Results: We'll contact you directly if results are abnormal or you don't use MyChart. Most normal results will be on MyChart within 2-3 business days, with a review message from Dr. Jesus. Haven't heard back in 2 weeks? Need results sooner? Contact us  at [phone]. [x]   Referrals: Our referral coordinator will manage specialist referrals. The specialist's office should contact you within 2 weeks to schedule an appointment. Call us  if you haven't heard from them after 2 weeks.  Staying Connected  [x]   MyChart: Activate your MyChart for the fastest way to access results and message us . See the last page of this paperwork for instructions on how to  activate.  Billing  [x]   X-ray & Lab Orders: These are billed by separate companies. Contact the invoicing company directly for questions or concerns. [x]   Visit Charges: Discuss any billing inquiries with our administrative services team.  Your Satisfaction Matters  [x]   Share Your Experience: We strive for your satisfaction! If you have any complaints, or preferably compliments, please let Dr. Jesus know directly or contact our Practice Administrators, Manuelita Rubin or [employer], by asking at the front desk.    Medical Screening Exam A medical screening exam (MSE) helps to determine whether you need immediate medical treatment relating to any number of symptoms you are having. This type of exam may be done in an emergency department, an urgent care setting, or your health care provider's office. Depending on your symptoms and severity, you may need additional tests or medical therapy. It  is important to note that an MSE does not necessarily mean that you will need or receive further medical testing or interventions if your symptoms are not deemed to be medically urgent (emergent). Tell a health care provider about: Any allergies you have. All medicines you are taking, including vitamins, herbs, eye drops, creams, and over-the-counter medicines. Any problems you or family members have had with anesthetic medicines. Any bleeding problems you have. Any surgeries you have had. Any medical conditions you have. Whether you are pregnant or may be pregnant. What happens during the test? During the exam, a health care provider does a short, often focused, physical exam and asks about your medical history to assess: Your current symptoms. Your overall health. Your need for possible further medical intervention. What can I expect after the test? If you have a regular health care provider, make an appointment for a follow-up visit with him or her. If you do not have a regular health care  provider, ask about resources in your community. Your medical screening exam may determine that: You do not need emergency treatment at this time. You need treatment right away. You need to be transferred to another medical center. This may happen if you need an emergent specialist or consultant that is not available at the medical center you are at. You need to have more tests. A medical specialist may be consulted if needed. Get help right away if: Your condition gets worse. You develop new or troubling symptoms before you see your health care provider. These symptoms may represent a serious problem that is an emergency. Do not wait to see if the symptoms will go away. Get medical help right away. Call your local emergency services (911 in the [place] Do not drive yourself to the hospital. Summary A medical screening exam helps to determine whether you need medical treatment right away. This type of exam may be done in an emergency department, an urgent care setting, or your health care provider's office. During the exam, a health care provider does a short physical exam and asks about your current symptoms and overall health. Depending on the exam, more tests or therapies may be ordered. However, an MSE does not necessarily mean that you will have further medical testing if your symptoms are not deemed to be urgent. If you need further care that is not offered at your current medical center, you may need to be transferred to another facility. This information is not intended to replace advice given to you by your health care provider. Make sure you discuss any questions you have with your health care provider. Document Revised: 07/17/2021 Document Reviewed: 03/14/2021 Elsevier Patient Education  2024 [employer]   ALLERGY MANAGEMENT PLAN  This plan is designed to help manage your allergic rhinitis (nasal allergies) effectively. Follow these steps daily for best results.  Sinus saline  sprays- use nightly, and after sneezing episodes or exposure to allergen.  Insert deeply and spray mist into nose while leaning over sink at 45 degrees,  while gently breathing. Also blow out onto tissue while leaning forward 45 degrees. Once daily, after a sinus rinse, use sensimist.  Just before bedtime is best. This only needed if allergies acting up.  If this is inadequate add-on once daily for levocetirizine / xyzal 5 mg for nondrowsy antihistamine Take benadryl 25 mg at bedtime also if allergic mucus is persisting  When allergies cause chronic swelling in sinuses, it leads to sinus infections:    DAILY TREATMENT ROUTINE  Time of Day Treatment Steps  Morning 1. Saline Nasal Spray - Use to cleanse nasal passages 2. Xyzal (levocetirizine) - Take one tablet daily   Throughout Day Saline Nasal Spray - Use 2 additional times (mid-day and afternoon)   Evening/Bedtime 1. Saline Nasal Rinse - Thoroughly clean nasal passages 2. Flonase  Sensimist - Apply after nasal rinse 3. Benadryl (diphenhydramine) - Take 25mg  if experiencing persistent congestion    PROPER TECHNIQUE GUIDE       Saline Nasal Spray/Rinse Technique: Lean forward over sink at a 45-degree angle Turn head slightly to one side Insert spray tip into upper nostril Spray gently while breathing lightly through your nose Repeat on other side Gently blow nose to clear excess solution Use saline spray 3 times daily to keep nasal passages moist and clear allergens.       Flonase  Sensimist Technique: Shake bottle gently before each use Prime the bottle if it's new or hasn't been used for a week Tilt your head forward slightly Insert tip into nostril, pointing away from the center of your nose Spray while inhaling gently Repeat in other nostril Use Flonase  Sensimist once daily, preferably at bedtime after using saline rinse. It may take several days of regular use to feel maximum benefit.   WHY FLONASE  SENSIMIST?    Benefits of Flonase  Sensimist:  Alcohol-free and scent-free formula - gentler on sensitive nasal passages Fine mist application - more comfortable with less dripping down throat Effectively relieves nasal congestion, sneezing, runny nose, and even eye symptoms 24-hour relief with once-daily dosing Uses a more potent form of fluticasone  that works at a lower dose Less liquid per spray means less discomfort  UNDERSTANDING YOUR MEDICATIONS   Medication How It Works Important Notes  Flonase  Sensimist (fluticasone  furoate) Reduces inflammation in nasal passages, addressing the underlying cause of allergy symptoms - Takes several days for full effect - Use daily for best results - Safe for long-term use   Xyzal (levocetirizine) Blocks histamine to reduce allergy symptoms like sneezing and itching - Take at the same time each day - May cause drowsiness in some people - Once-daily dosing   Benadryl (diphenhydramine) Antihistamine that provides additional relief for breakthrough symptoms - Causes drowsiness - Use only at bedtime - For occasional use when needed   Saline Spray/Rinse Physically removes allergens and moistens nasal passages - Safe to use frequently - Improves effectiveness of other treatments - Reduces nasal irritation    CONTACT YOUR PROVIDER IF: Your symptoms do not improve after 1-2 weeks of following this plan You develop sinus pain with fever or green/yellow discharge You experience frequent nosebleeds You develop new or worsening symptoms You have questions about your treatment plan     ADDITIONAL ALLERGY MANAGEMENT TIPS   HELPFUL STRATEGIES: ?? Keep windows closed during high pollen seasons ??? Use allergen-proof covers for pillows and mattresses ?? Vacuum regularly with a HEPA filter vacuum ?? Shower and change clothes after spending time outdoors ?? Check local pollen counts and limit outdoor time when counts are high ?? Stay well-hydrated to help keep  mucous membranes moist

## 2024-07-07 NOTE — Assessment & Plan Note (Signed)
 Autoimmune gastritis with associated SIBO. Reports weight loss and dietary changes. Discussed relationship between autoimmune gastritis and SIBO. Prescribe rifaximin  for SIBO treatment. Discuss dietary habits and monitor weight changes.

## 2024-07-07 NOTE — Assessment & Plan Note (Signed)
 Peripheral neuropathy with significant numbness in toes. Diabetes is well controlled with A1c under 6.5 for the last year. No pain associated with neuropathy. Perform daily self-checks for cuts or ulcers on feet. Consider annual podiatrist visit for foot care. Managed by endocrinology

## 2024-07-07 NOTE — Assessment & Plan Note (Signed)
 Managed by endocrinology.

## 2024-07-07 NOTE — Progress Notes (Signed)
 [facility] at [facility] [address] [place], [place] [address] Office:  [phone]  -- Annual Preventive Medical Office Visit --  Patient:  Frederick Robinson.      Age: 63 y.o.       Sex:  male  Date:   07/07/2024 Patient Care Team: Jesus Bernardino MATSU, MD as PCP - General (Internal Medicine) Michele Richardson, DO as PCP - Cardiology (Cardiology) Tommas Pears, MD as Referring Physician (Endocrinology) Towana Fonda RAMAN, MD as Referring Physician (Dermatology) Dohmeier, Dedra, MD as Consulting Physician (Neurology) Lad[place], [place], [place] as Referring Physician (Optometry) Tommas Pears, MD as Referring Physician (Endocrinology) Today's Healthcare Provider: Bernardino MATSU Jesus, MD  ========================================= Chief complaint: Chronic Kidney Disease and Annual Exam (Pt states here for cpe and following up from labs)  Purpose of Visit: Comprehensive preventive health assessment and personalized health maintenance planning.  This encounter was conducted as a Comprehensive Physical Exam (CPE) preventive care annual visit. The patient's medical history and problem list were reviewed to inform individualized preventive care recommendations.   No problem-specific medical treatment was provided during this visit.  Assessment & Plan Encounter for annual general medical examination with abnormal findings in adult Routine adult wellness visit with stable A1c under 6.5 for the past year. No significant changes in vision or dental health. Schedule an eye appointment as it has been over a year since the last visit. Ensure dental follow-up is maintained. Choose a Medicare plan that covers annual physicals. Allergic rhinitis due to other allergic trigger, unspecified seasonality Gave AVS allergy handout  Allergic rhinitis with current symptoms. History of COVID-19 with temporary loss of smell. ENT referral planned for further evaluation. Provide allergy management strategies for  symptom control. ENT referral for further evaluation. Eczema of external auditory canal Eczematous dermatitis on wrist likely due to watch band irritation and in ear canal possibly due to chronic irritation. Prescribe triamcinolone  cream for application to affected areas, including the ear. Consider changing watch band to prevent further irritation. Acute renal failure superimposed on stage 3a chronic kidney disease, unspecified acute renal failure type (HCC) Stage 3a chronic kidney disease (HCC) Chronic kidney disease with slow decline in kidney function and recent decrease in GFR. Discussed potential lab artifact and impact of thyroid  dysfunction on kidney function. Order cystatin C test for more accurate assessment. Perform urine and blood tests for monitoring. History of smoking 25-50 pack years LDCT shared decision making and ordered Small intestinal bacterial overgrowth (SIBO) SIBO with symptoms of gas and bloating, particularly at night. Discussed possibility of recurrence and need for further treatment. Prescribe rifaximin  with multiple refills for potential recurrence. Monitor symptoms and adjust treatment as necessary. Hypothyroidism, unspecified type Managed by endocrinology Type 2 diabetes mellitus with stage 3a chronic kidney disease, with long-term current use of insulin (HCC) DM (diabetes mellitus), type 2 with neurological complications (HCC) Peripheral neuropathy with significant numbness in toes. Diabetes is well controlled with A1c under 6.5 for the last year. No pain associated with neuropathy. Perform daily self-checks for cuts or ulcers on feet. Consider annual podiatrist visit for foot care. Managed by endocrinology Hyperlipidemia associated with type 2 diabetes mellitus (HCC) Hyperlipidemia with persistently elevated triglycerides despite Repatha . Previous triglyceride levels as high as 1500. Repatha  improved HDL and LDL, but triglycerides remain above 200. Insurance denied  alternative medication. Contact insurance company regarding denial. Continue current treatment with Repatha . B12 deficiency Vitamin B12 levels previously elevated due to high supplementation. Current regimen of 5000 mcg every other  day is adequate. Continue current B12 supplementation regimen.  Was severe but anemia and B12 deficiency resolved with supplement by 2025 but recommend continuation anyway to prevent recurrence. Per most recent structured social history update,  reports current alcohol use of about 16.0 standard drinks of alcohol per week. Autoimmune gastritis Autoimmune gastritis with associated SIBO. Reports weight loss and dietary changes. Discussed relationship between autoimmune gastritis and SIBO. Prescribe rifaximin  for SIBO treatment. Discuss dietary habits and monitor weight changes.      ICD-10-CM   1. Encounter for annual general medical examination with abnormal findings in adult  Z00.01     2. Allergic rhinitis due to other allergic trigger, unspecified seasonality  J30.89 Ambulatory referral to ENT    3. Eczema of external auditory canal  H60.549 Ambulatory referral to ENT    4. Acute renal failure superimposed on stage 3a chronic kidney disease, unspecified acute renal failure type (HCC)  N17.9 Protein / creatinine ratio, urine   N18.31 Cystatin C with Glomerular Filtration Rate, Estimated (eGFR)    Basic metabolic panel with GFR    triamcinolone  cream (KENALOG ) 0.1 %    Basic metabolic panel with GFR    Cystatin C with Glomerular Filtration Rate, Estimated (eGFR)    Protein / creatinine ratio, urine    5. History of smoking 25-50 pack years  Z87.891 Ambulatory Referral for Lung Cancer Screening [REF832]    6. Small intestinal bacterial overgrowth (SIBO)  X36.1780 rifaximin  (XIFAXAN ) 200 MG tablet    7. Hypothyroidism, unspecified type  E03.9 T4, free    T4, free    8. Type 2 diabetes mellitus with stage 3a chronic kidney disease, with long-term current use  of insulin (HCC)  E11.22 Renal Function Panel   N18.31    Z79.4     9. Hyperlipidemia associated with type 2 diabetes mellitus (HCC)  E11.69 Renal Function Panel   E78.5     10. Stage 3a chronic kidney disease (HCC)  N18.31     11. DM (diabetes mellitus), type 2 with neurological complications (HCC)  E11.49     12. B12 deficiency  E53.8     13. Autoimmune gastritis  K29.40        Reviewed/updated/encouraged completion: Immunization History  Administered Date(s) Administered   Influenza Split 09/10/2013, 09/16/2023   Influenza Whole 08/21/2008, 08/28/2009, 08/29/2009   Influenza, Seasonal, Injecte, Preservative Fre 10/11/2014   Influenza,inj,Quad PF,6+ Mos 08/31/2015, 11/13/2018   Moderna Sars-Covid-2 Vaccination 09/16/2023   Pneumococcal Polysaccharide-23 02/15/2018   Td 11/01/2008   Tdap 10/01/2007, 02/15/2018   Zoster Recombinant(Shingrix ) 07/22/2023   Health Maintenance Due  Topic Date Due   OPHTHALMOLOGY EXAM  11/17/2012   FOOT EXAM  08/05/2016   Pneumococcal Vaccine: 50+ Years (2 of 2 - PCV) 02/16/2019   Lung Cancer Screening  08/27/2023   Zoster Vaccines- Shingrix  (2 of 2) 09/16/2023   COVID-19 Vaccine (5 - 2024-25 season) 11/11/2023   HEMOGLOBIN A1C  01/10/2024   Diabetic kidney evaluation - Urine ACR  05/28/2024   INFLUENZA VACCINE  06/17/2024   Health Maintenance  Topic Date Due   OPHTHALMOLOGY EXAM  11/17/2012   FOOT EXAM  08/05/2016   Pneumococcal Vaccine: 50+ Years (2 of 2 - PCV) 02/16/2019   Lung Cancer Screening  08/27/2023   Zoster Vaccines- Shingrix  (2 of 2) 09/16/2023   COVID-19 Vaccine (5 - 2024-25 season) 11/11/2023   HEMOGLOBIN A1C  01/10/2024   Diabetic kidney evaluation - Urine ACR  05/28/2024   INFLUENZA VACCINE  06/17/2024  Diabetic kidney evaluation - eGFR measurement  07/07/2025   Colonoscopy  10/13/2026   DTaP/Tdap/Td (4 - Td or Tdap) 02/16/2028   Hepatitis B Vaccines 19-59 Average Risk  Completed   Hepatitis C Screening  Completed    HIV Screening  Completed   HPV VACCINES  Aged Out   Meningococcal B Vaccine  Aged Out    Reviewed the following verbally with patient and provided AVS materials:   HEALTH MAINTENANCE COUNSELING AND ANTICIPATORY GUIDANCE    Preventive Measure Recommendation  Eye Exams Every 1-2 years  Dental Care Cleanings every 6 months or more, brush/floss 3x daily  Sinus Care Saline spray rinses daily  Sleep 8 hours nightly, good sleep hygiene, e-monitoring if any daytime drowsiness  Diet Fruits/vegetables/fiber/healthy fats, balance and moderation  Exercise 150 minutes weekly  Risk Behaviors Discouraged any/all high risk behaviors    CANCER SCREENING SHARED DECISION MAKING    Penile/Testicle/Scrotum Encouraged self-monitoring and reporting of genital abnormalities. Patient reports none.  Thyroid  Checked and advised to palpate thyroid  for nodules  Prostate Individualized risks/benefits/costs discussed Lab Results  Component Value Date   PSA 0.78 06/16/2024   PSA 0.89 01/22/2024   PSA 0.54 07/06/2023    Colon HM Colonoscopy          Upcoming     Colonoscopy (Every 3 Years) Next due on 10/13/2026    10/14/2023  COLONOSCOPY   Only the first 1 history entries have been loaded, but more history exists.                Lung Current guidelines recommend individuals aged 51 to 76 who currently smoke or formerly smoked and have a >= 20 pack-year smoking history should undergo annual screening with low-dose computed tomography (LDCT). Tobacco Use: Medium Risk (07/07/2024)   Patient History    Smoking Tobacco Use: Former    Smokeless Tobacco Use: Never    Passive Exposure: Not on file   Social History   Tobacco Use  Smoking Status Former   Current packs/day: 0.00   Average packs/day: 2.0 packs/day for 25.0 years (50.0 ttl pk-yrs)   Types: Cigarettes, Cigars   Start date: 07/12/1984   Quit date: 07/12/2009   Years since quitting: 14.9  Smokeless Tobacco Never    Skin Advised  regular sunscreen use. Patient denies worrisome, changing, or new skin lesions. Offered to include images in chart for surveillance. Showed patient these pictures of melanomas for reference to educate for self-monitoring.  Other Cancers Discussed lack of screening guidelines and insurance coverage for other cancer types.    Discussed the use of AI scribe software for clinical note transcription with the patient, who gave verbal consent to proceed.  History of Present Illness  63 year old male with chronic kidney disease and hypothyroidism who presents for a follow-up on his kidney function and thyroid  levels.  He has chronic kidney disease with a recent creatinine level of 1.73 and concerns about decreased GFR. He experiences lower back pain, which he attributes to his mattress and aging. The pain is more towards the sides, above the hips, with no direct trauma noted. He maintains hydration with at least five 8-ounce glasses of water  daily. He is scheduled to see a rheumatologist in September.  He has hypothyroidism and is currently on Levoxine, Taurine, and occasionally Synthroid. He was previously on 224 mcg but has adjusted his dosage to 300 mcg three times a week. He notes fluctuations in his thyroid  levels.  He has a history of high  triglycerides, previously as high as 1500, and is currently on Repatha . His triglycerides remain above 200 despite treatment. He has been monitoring his PSA levels due to a family history of prostate cancer, which have shown a rise in the past. He is concerned about insurance coverage for frequent PSA testing.  He reports a history of SIBO and is currently experiencing symptoms such as gas and weight loss. He takes Tums for gas relief and has been prescribed rifaximin  in the past. He has a history of autoimmune gastritis and pernicious anemia, which he believes are under control.  He has a history of melanoma, stage zero, and follows up with dermatology every six  months. He reports a family history of bladder cancer in his brother and mother, and liver cancer in his father.  He reports neuropathy in his feet, with numbness in his toes but no associated pain. He has a history of poorly controlled blood sugar in the past, but his A1c has been less than 6.5 for the last 12 months. He performs daily foot checks due to his neuropathy.  He mentions a rash- that looks like contact dermatitis or eczema, particularly on his wrist under his watch. He was previously unaware of any ear canal swelling.  ROS A comprehensive ROS was negative for any concerning symptoms.   Completed medication reconciliation: Current Outpatient Medications on File Prior to Visit  Medication Sig   Alpha-Lipoic Acid 600 MG TABS Take 600 mg by mouth daily. If a formulation with BIOTIN  exists , please dispense 600 mg Alpha lipoic fatty acid and 660 mg biotin  per tab or capsule.   amLODipine  (NORVASC ) 10 MG tablet Take 1 tablet (10 mg total) by mouth daily at 10 pm.   aspirin 81 MG tablet Take 81 mg by mouth daily.   B-D UF III MINI PEN NEEDLES 31G X 5 MM MISC USE AS DIRECTED   carvedilol  (COREG ) 12.5 MG tablet Take 1 tablet (12.5 mg total) by mouth 2 (two) times daily with a meal.   Cobalamin Combinations (B-12) 1000-400 MCG SUBL Place 1 tablet under the tongue daily.   Continuous Glucose Sensor (DEXCOM G6 SENSOR) MISC USE AS DIRECTED. NEED TO REPLACE EVERY 10 DAYS.   Continuous Glucose Transmitter (DEXCOM G6 TRANSMITTER) MISC USE AS DIRECTED   dapagliflozin propanediol (FARXIGA) 10 MG TABS tablet Take by mouth daily.   diclofenac  Sodium (VOLTAREN ) 1 % GEL USE AS DIRECTED   Evolocumab  (REPATHA  SURECLICK) 140 MG/ML SOAJ Inject 140 mg into the skin every 14 (fourteen) days.   fenofibrate micronized (LOFIBRA) 200 MG capsule daily before breakfast.   Finerenone  (KERENDIA ) 20 MG TABS Take 1 tablet (20 mg total) by mouth daily.   icosapent  Ethyl (VASCEPA ) 1 g capsule Take 2 capsules (2 g  total) by mouth 2 (two) times daily.   isosorbide-hydrALAZINE  (BIDIL) 20-37.5 MG tablet Take 1 tablet by mouth 3 (three) times daily.   levothyroxine (SYNTHROID) 112 MCG tablet Take 224 mcg by mouth daily.   loratadine (CLARITIN) 10 MG tablet Take 10 mg by mouth daily.   losartan  (COZAAR ) 100 MG tablet Take 1 tablet (100 mg total) by mouth daily at 10 pm.   rosuvastatin (CRESTOR) 40 MG tablet Take 40 mg by mouth daily.   sertraline  (ZOLOFT ) 50 MG tablet Take 1 tablet (50 mg total) by mouth daily.   TRESIBA FLEXTOUCH 200 UNIT/ML FlexTouch Pen Inject 50 Units into the skin in the morning and at bedtime.   omeprazole  (PRILOSEC) 40 MG capsule Take 1  capsule (40 mg total) by mouth daily. (Patient not taking: Reported on 07/07/2024)   No current facility-administered medications on file prior to visit.  There are no discontinued medications.The following were reviewed and/or entered/updated into our electronic MEDICAL RECORD NUMBERPast Medical History:  Diagnosis Date   Arthritis 01/30/2023   B12 deficiency 07/27/2023   Coronary artery calcification    Diabetes mellitus without complication (HCC)    Gastric intestinal metaplasia without dysplasia 10/14/2023   Hx of adenomatous colonic polyps 10/14/2023   Hyperlipidemia    Hypertension    Melanoma (HCC)    Thyroid  disease    Past Surgical History:  Procedure Laterality Date   COLONOSCOPY N/A 11/07/2014   Procedure: COLONOSCOPY;  Surgeon: Oneil DELENA Budge, MD;  Location: [facility];  Service: Gastroenterology;  Laterality: N/A;  830   FRACTURE SURGERY  06/11/2023   KNEE SURGERY Right    REPAIR OF PERONEUS BREVIS TENDON Left 06/11/2023   Procedure: REPAIR OF PERONEUS BREVIS TENOLYSIS;  Surgeon: Kit Rush, MD;  Location: [facility];  Service: Orthopedics;  Laterality: Left;   TENDON REPAIR Left 06/11/2023   Procedure: LEFT PERONEUS LONGUS REPAIR;  Surgeon: Kit Rush, MD;  Location: [facility];  Service:  Orthopedics;  Laterality: Left;   Social History   Socioeconomic History   Marital status: Married    Spouse name: Rosaline   Number of children: 1   Years of education: Not on file   Highest education level: Bachelor's degree (e.g., BA, AB, BS)  Occupational History   Not on file  Tobacco Use   Smoking status: Former    Current packs/day: 0.00    Average packs/day: 2.0 packs/day for 25.0 years (50.0 ttl pk-yrs)    Types: Cigarettes, Cigars    Start date: 07/12/1984    Quit date: 07/12/2009    Years since quitting: 14.9   Smokeless tobacco: Never  Vaping Use   Vaping status: Never Used  Substance and Sexual Activity   Alcohol use: Yes    Alcohol/week: 16.0 standard drinks of alcohol    Types: 6 Glasses of wine, 10 Standard drinks or equivalent per week    Comment: weekends   Drug use: No   Sexual activity: Yes    Birth control/protection: None  Other Topics Concern   Not on file  Social History Narrative   Not on file   Social Drivers of Health   Financial Resource Strain: Low Risk  (06/21/2024)   Overall Financial Resource Strain (CARDIA)    Difficulty of Paying Living Expenses: Not very hard  Food Insecurity: No Food Insecurity (06/21/2024)   Hunger Vital Sign    Worried About Running Out of Food in the Last Year: Never true    Ran Out of Food in the Last Year: Never true  Transportation Needs: No Transportation Needs (06/21/2024)   PRAPARE - Administrator, Civil Service (Medical): No    Lack of Transportation (Non-Medical): No  Physical Activity: Insufficiently Active (06/21/2024)   Exercise Vital Sign    Days of Exercise per Week: 3 days    Minutes of Exercise per Session: 30 min  Stress: No Stress Concern Present (06/21/2024)   [employer] of Occupational Health - Occupational Stress Questionnaire    Feeling of Stress: Only a little  Social Connections: Socially Isolated (06/21/2024)   Social Connection and Isolation Panel    Frequency of  Communication with Friends and Family: Once a week    Frequency of Social  Gatherings with Friends and Family: Once a week    Attends Religious Services: Never    Database administrator or Organizations: No    Attends Engineer, structural: Not on file    Marital Status: Married  Intimate Partner Violence: Unknown (06/26/2023)   Received from [facility]   HITS    Physically Hurt: Not on file    Insult or Talk Down To: Not on file    Threaten Physical Harm: Not on file    Scream or Curse: Not on file      06/21/2024    3:28 PM  Alcohol Use Disorder Test (AUDIT)  1. How often do you have a drink containing alcohol? 4  2. How many drinks containing alcohol do you have on a typical day when you are drinking? 1  3. How often do you have six or more drinks on one occasion? 2  AUDIT-C Score 7   4. How often during the last year have you found that you were not able to stop drinking once you had started? 0  5. How often during the last year have you failed to do what was normally expected from you because of drinking? 0  6. How often during the last year have you needed a first drink in the morning to get yourself going after a heavy drinking session? 0  7. How often during the last year have you had a feeling of guilt of remorse after drinking? 0  8. How often during the last year have you been unable to remember what happened the night before because you had been drinking? 1  9. Have you or someone else been injured as a result of your drinking? 0  10. Has a relative or friend or a doctor or another health worker been concerned about your drinking or suggested you cut down? 0  Alcohol Use Disorder Identification Test Final Score (AUDIT) 8      Patient-reported   Family History  Problem Relation Age of Onset   Cancer Mother    Kidney disease Mother    Heart disease Father    Heart attack Father    Cancer Father        liver cancer   Diabetes Father    Obesity Father     Cancer Brother 8   Bladder Cancer Brother    Allergies  Allergen Reactions   Jardiance [Empagliflozin] Hives   Pravastatin Sodium     REACTION: HIVES   Social History   Substance and Sexual Activity  Sexual Activity Yes   Birth control/protection: None  @    01/25/2024    2:05 PM  Depression screen PHQ 2/9  Decreased Interest 0  Down, Depressed, Hopeless 0  PHQ - 2 Score 0      01/25/2024    2:05 PM  Fall Risk   Falls in the past year? 0  Number falls in past yr: 0  Injury with Fall? 0  Risk for fall due to : No Fall Risks  Follow up Falls evaluation completed     BP 138/78   Pulse 73   Temp 98 F (36.7 C) (Temporal)   Ht 6' 3 (1.905 m)   Wt 238 lb 9.6 oz (108.2 kg)   SpO2 98%   BMI 29.82 kg/m  BP Readings from Last 3 Encounters:  07/07/24 138/78  03/30/24 130/60  01/25/24 (!) 169/83   Wt Readings from Last 10 Encounters:  07/07/24 238 lb 9.6  oz (108.2 kg)  03/30/24 244 lb 3.2 oz (110.8 kg)  01/25/24 241 lb 6.4 oz (109.5 kg)  01/25/24 243 lb (110.2 kg)  10/26/23 239 lb 3.2 oz (108.5 kg)  10/21/23 242 lb (109.8 kg)  10/14/23 243 lb (110.2 kg)  10/06/23 243 lb 6.4 oz (110.4 kg)  09/25/23 235 lb 6.1 oz (106.8 kg)  08/20/23 242 lb 3.2 oz (109.9 kg)  Physical Exam  Physical Exam HEENT: Ear canal partially closed and swollen. Gold dental work present. NECK: Thyroid  normal, no masses. No cervical lymphadenopathy. EXTREMITIES: Foot numbness present. Fungal infection in toenail. Skin healthy, no ulcers. Blood flow adequate, nerve damage present. Moderate blood flow in extremities. NEUROLOGICAL: Diminished knee reflexes. SKIN: Multiple skin spots on back.  Diabetic Foot Exam - Simple   Simple Foot Form Visual Inspection No deformities, no ulcerations, no other skin breakdown bilaterally: Yes Sensation Testing Intact to touch and monofilament testing bilaterally: Yes Pulse Check Posterior Tibialis and Dorsalis pulse intact bilaterally:  Yes Comments Inspection:  Feet inspected bilaterally for skin changes, ulcers, calluses, infection, nail abnormalities, and deformities.  No open lesions, erythema, or signs of active infection noted.  Skin intact; no significant callus formation.  Toenails with some fungal overgrowth; trimmed appropriately. Vascular Assessment:  Dorsalis pedis and posterior tibial pulses palpated bilaterally; pulses present and symmetric.  Capillary refill <2 seconds.  No edema observed. Neurologic Assessment:  Protective sensation tested with 10g monofilament at standard plantar sites bilaterally (great toe, 1st/3rd/5th metatarsal heads, heel). Sensation intact except on toe tips Musculoskeletal / Structural:  No Charcot changes, bunions, or significant deformities.  Normal gait and range of motion. Patient Education:  Counseled on daily self-exam of feet,  and when to seek care for wounds or changes. Assessment:  Diabetic foot exam performed; no abnormal findings requiring podiatry referral at this time.      Diabetic foot exam was performed with the following findings:   No deformities, ulcerations, or other skin breakdown Normal sensation of 10g monofilament Intact posterior tibialis and dorsalis pedis pulses Inspection:  Feet inspected bilaterally for skin changes, ulcers, calluses, infection, nail abnormalities, and deformities.  No open lesions, erythema, or signs of active infection noted.  Skin intact; no significant callus formation.  Toenails with some fungal overgrowth; trimmed appropriately. Vascular Assessment:  Dorsalis pedis and posterior tibial pulses palpated bilaterally; pulses present and symmetric.  Capillary refill <2 seconds.  No edema observed. Neurologic Assessment:  Protective sensation tested with 10g monofilament at standard plantar sites bilaterally (great toe, 1st/3rd/5th metatarsal heads, heel). Sensation intact except on toe  tips Musculoskeletal / Structural:  No Charcot changes, bunions, or significant deformities.  Normal gait and range of motion. Patient Education:  Counseled on daily self-exam of feet,  and when to seek care for wounds or changes. Assessment:  Diabetic foot exam performed; no abnormal findings requiring podiatry referral at this time.          GEN: No acute distress, resting comfortably. HEENT: Tympanic membranes normal appearing bilaterally, oropharynx clear, no thyromegaly noted, no palpable lymphadenopathy or thyroid  nodules. CARDIOVASCULAR: S1 and S2 heart sounds with regular rate and rhythm, no murmurs appreciated. PULMONARY: Normal work of breathing, clear to auscultation bilaterally, no crackles, wheezes, or rhonchi. ABDOMEN: Soft, nontender, nondistended. MSK: No edema, cyanosis, or clubbing noted. SKIN: Warm, dry, no lesions of concern observed. NEUROLOGICAL: Cranial nerves II-XII grossly intact, strength 5/5 in upper and lower extremities, reflexes symmetric and intact bilaterally. PSYCH: Normal affect and thought content, pleasant and cooperative.      ======================================  IMPORTANT HEALTH REMINDERS: Report any new or changing skin lesions promptly Maintain recommended screening schedules Discuss any new family history of cancer at future visits Follow up on any new symptoms that persist more than two weeks      Notes:  This document was synthesized by artificial intelligence ([employer]) using HIPAA-compliant recording of the clinical interaction;   We discussed the use of AI scribe software for clinical note transcription with the patient, who gave verbal consent to proceed.    This encounter employed state-of-the-art, real-time, collaborative documentation. The patient was empowered to actively review and assist in updating their electronic medical record on a shared monitor, ensuring transparency and improving accuracy.    Prior to and  at the beginning of Comprehensive Physical Exam (CPE) preventive care annual visit appointment types  we clarify to patients Our goal today is to focus on your preventive or annual Comprehensive Physical Exam (CPE) preventive care annual visit, which typically covers routine screenings and overall health maintenance. However, if you share any new or concerning symptoms--such as dizziness, passing out, severe pain, or anything else that may point to a more serious issue--we are both legally and ethically required to evaluate it. We cannot simply overlook or ignore such concerns, even if you later decide you don't want to discuss them, because it could jeopardize your health.  If addressing a new concern takes us  beyond the scope of the preventive visit, we may need to bill separately for that portion of care. We understand financial considerations are important, and we're happy to discuss your options if something new comes up. However, we want to be clear that once you mention a potentially serious issue, we must investigate it; we can't ethically or legally exclude that from our records or our evaluation. Please let us  know all of your questions or worries. Together, we can decide how best to manage them and how to minimize any unexpected costs, but we want to keep you safe above all else.   This disclosure is mandated by professional ethics and legal obligations, as healthcare providers must address any substantial health concerns raised during any patient interaction and a comprehensive ROS is required by insurance companies for billing preventive-care visit type.   This disclosure ultimately discourages patients financially from reporting significant health issues.   Medical Screening Exam A medical screening exam (MSE) helps to determine whether you need immediate medical treatment relating to any number of symptoms you are having. This type of exam may be done in an emergency department, an urgent care  setting, or your health care provider's office. Depending on your symptoms and severity, you may need additional tests or medical therapy. It is important to note that an MSE does not necessarily mean that you will need or receive further medical testing or interventions if your symptoms are not deemed to be medically urgent (emergent). Tell a health care provider about: Any allergies you have. All medicines you are taking, including vitamins, herbs, eye drops, creams, and over-the-counter medicines. Any problems you or family members have had with anesthetic medicines. Any bleeding problems you have. Any surgeries you have had. Any medical conditions you have. Whether you are pregnant or may be pregnant. What happens during the test? During the exam, a health care provider does a short, often focused, physical exam and asks about your medical history to assess: Your current symptoms. Your overall health. Your need for possible further medical intervention. What can I expect after the test? If you have  a regular health care provider, make an appointment for a follow-up visit with him or her. If you do not have a regular health care provider, ask about resources in your community. Your medical screening exam may determine that: You do not need emergency treatment at this time. You need treatment right away. You need to be transferred to another medical center. This may happen if you need an emergent specialist or consultant that is not available at the medical center you are at. You need to have more tests. A medical specialist may be consulted if needed. Get help right away if: Your condition gets worse. You develop new or troubling symptoms before you see your health care provider. These symptoms may represent a serious problem that is an emergency. Do not wait to see if the symptoms will go away. Get medical help right away. Call your local emergency services (911 in the [place] Do not  drive yourself to the hospital. Summary A medical screening exam helps to determine whether you need medical treatment right away. This type of exam may be done in an emergency department, an urgent care setting, or your health care provider's office. During the exam, a health care provider does a short physical exam and asks about your current symptoms and overall health. Depending on the exam, more tests or therapies may be ordered. However, an MSE does not necessarily mean that you will have further medical testing if your symptoms are not deemed to be urgent. If you need further care that is not offered at your current medical center, you may need to be transferred to another facility. This information is not intended to replace advice given to you by your health care provider. Make sure you discuss any questions you have with your health care provider. Document Revised: 07/17/2021 Document Reviewed: 03/14/2021 Elsevier Patient Education  2024 [employer]   Health Maintenance, Male Adopting a healthy lifestyle and getting preventive care are important in promoting health and wellness. Ask your health care provider about: The right schedule for you to have regular tests and exams. Things you can do on your own to prevent diseases and keep yourself healthy. What should I know about diet, weight, and exercise? Eat a healthy diet  Eat a diet that includes plenty of vegetables, fruits, low-fat dairy products, and lean protein. Do not eat a lot of foods that are high in solid fats, added sugars, or sodium. Maintain a healthy weight Body mass index (BMI) is a measurement that can be used to identify possible weight problems. It estimates body fat based on height and weight. Your health care provider can help determine your BMI and help you achieve or maintain a healthy weight. Get regular exercise Get regular exercise. This is one of the most important things you can do for your health. Most  adults should: Exercise for at least 150 minutes each week. The exercise should increase your heart rate and make you sweat (moderate-intensity exercise). Do strengthening exercises at least twice a week. This is in addition to the moderate-intensity exercise. Spend less time sitting. Even light physical activity can be beneficial. Watch cholesterol and blood lipids Have your blood tested for lipids and cholesterol at 64 years of age, then have this test every 5 years. You may need to have your cholesterol levels checked more often if: Your lipid or cholesterol levels are high. You are older than 64 years of age. You are at high risk for heart disease. What should I know  about cancer screening? Many types of cancers can be detected early and may often be prevented. Depending on your health history and family history, you may need to have cancer screening at various ages. This may include screening for: Colorectal cancer. Prostate cancer. Skin cancer. Lung cancer. What should I know about heart disease, diabetes, and high blood pressure? Blood pressure and heart disease High blood pressure causes heart disease and increases the risk of stroke. This is more likely to develop in people who have high blood pressure readings or are overweight. Talk with your health care provider about your target blood pressure readings. Have your blood pressure checked: Every 3-5 years if you are 6-3 years of age. Every year if you are 30 years old or older. If you are between the ages of 33 and 31 and are a current or former smoker, ask your health care provider if you should have a one-time screening for abdominal aortic aneurysm (AAA). Diabetes Have regular diabetes screenings. This checks your fasting blood sugar level. Have the screening done: Once every three years after age 87 if you are at a normal weight and have a low risk for diabetes. More often and at a younger age if you are overweight or have  a high risk for diabetes. What should I know about preventing infection? Hepatitis B If you have a higher risk for hepatitis B, you should be screened for this virus. Talk with your health care provider to find out if you are at risk for hepatitis B infection. Hepatitis C Blood testing is recommended for: Everyone born from [birth year] through 1966. Anyone with known risk factors for hepatitis C. Sexually transmitted infections (STIs) You should be screened each year for STIs, including gonorrhea and chlamydia, if: You are sexually active and are younger than 64 years of age. You are older than 64 years of age and your health care provider tells you that you are at risk for this type of infection. Your sexual activity has changed since you were last screened, and you are at increased risk for chlamydia or gonorrhea. Ask your health care provider if you are at risk. Ask your health care provider about whether you are at high risk for HIV. Your health care provider may recommend a prescription medicine to help prevent HIV infection. If you choose to take medicine to prevent HIV, you should first get tested for HIV. You should then be tested every 3 months for as long as you are taking the medicine. Follow these instructions at home: Alcohol use Do not drink alcohol if your health care provider tells you not to drink. If you drink alcohol: Limit how much you have to 0-2 drinks a day. Know how much alcohol is in your drink. In the [place] one drink equals one 12 oz bottle of beer (355 mL), one 5 oz glass of wine (148 mL), or one 1 oz glass of hard liquor (44 mL). Lifestyle Do not use any products that contain nicotine or tobacco. These products include cigarettes, chewing tobacco, and vaping devices, such as e-cigarettes. If you need help quitting, ask your health care provider. Do not use street drugs. Do not share needles. Ask your health care provider for help if you need support or information about  quitting drugs. General instructions Schedule regular health, dental, and eye exams. Stay current with your vaccines. Tell your health care provider if: You often feel depressed. You have ever been abused or do not feel safe at home. Summary  Adopting a healthy lifestyle and getting preventive care are important in promoting health and wellness. Follow your health care provider's instructions about healthy diet, exercising, and getting tested or screened for diseases. Follow your health care provider's instructions on monitoring your cholesterol and blood pressure. This information is not intended to replace advice given to you by your health care provider. Make sure you discuss any questions you have with your health care provider. Document Revised: 03/25/2021 Document Reviewed: 03/25/2021 Elsevier Patient Education  2024 [employer].

## 2024-07-07 NOTE — Assessment & Plan Note (Signed)
 Hyperlipidemia with persistently elevated triglycerides despite Repatha . Previous triglyceride levels as high as 1500. Repatha  improved HDL and LDL, but triglycerides remain above 200. Insurance denied alternative medication. Contact insurance company regarding denial. Continue current treatment with Repatha .

## 2024-07-07 NOTE — Assessment & Plan Note (Signed)
 Vitamin B12 levels previously elevated due to high supplementation. Current regimen of 5000 mcg every other day is adequate. Continue current B12 supplementation regimen.  Was severe but anemia and B12 deficiency resolved with supplement by 2025 but recommend continuation anyway to prevent recurrence. Per most recent structured social history update,  reports current alcohol use of about 16.0 standard drinks of alcohol per week.

## 2024-07-08 ENCOUNTER — Ambulatory Visit: Payer: Self-pay | Admitting: Internal Medicine

## 2024-07-09 LAB — PROTEIN / CREATININE RATIO, URINE
Creatinine, Urine: 80 mg/dL (ref 20–320)
Protein/Creat Ratio: 163 mg/g{creat} — ABNORMAL HIGH (ref 25–148)
Protein/Creatinine Ratio: 0.163 mg/mg{creat} — ABNORMAL HIGH (ref 0.025–0.148)
Total Protein, Urine: 13 mg/dL (ref 5–25)

## 2024-07-09 LAB — CYSTATIN C WITH GLOMERULAR FILTRATION RATE, ESTIMATED (EGFR)
CYSTATIN C: 1.58 mg/L — ABNORMAL HIGH (ref 0.52–1.20)
eGFR: 42 mL/min/1.73m2 — ABNORMAL LOW (ref >=60)

## 2024-07-11 NOTE — Telephone Encounter (Signed)
 read by [name]. at 4:43PM on 07/09/2024.

## 2024-07-12 LAB — GENECONNECT MOLECULAR SCREEN: Genetic Analysis Overall Interpretation: NEGATIVE

## 2024-07-13 ENCOUNTER — Encounter: Payer: Self-pay | Admitting: Cardiology

## 2024-07-13 ENCOUNTER — Other Ambulatory Visit (HOSPITAL_COMMUNITY): Payer: Self-pay

## 2024-07-15 ENCOUNTER — Encounter: Payer: Self-pay | Admitting: *Deleted

## 2024-07-15 ENCOUNTER — Ambulatory Visit: Admitting: *Deleted

## 2024-07-15 DIAGNOSIS — E7800 Pure hypercholesterolemia, unspecified: Secondary | ICD-10-CM | POA: Diagnosis not present

## 2024-07-15 DIAGNOSIS — Z87891 Personal history of nicotine dependence: Secondary | ICD-10-CM | POA: Diagnosis not present

## 2024-07-15 DIAGNOSIS — E1165 Type 2 diabetes mellitus with hyperglycemia: Secondary | ICD-10-CM | POA: Diagnosis not present

## 2024-07-15 DIAGNOSIS — E039 Hypothyroidism, unspecified: Secondary | ICD-10-CM | POA: Diagnosis not present

## 2024-07-15 NOTE — Progress Notes (Signed)
 Virtual Visit via Telephone Note  I connected with [name]. on 07/15/24 at  9:30 AM EDT by telephone and verified that I am speaking with the correct person using two identifiers.  Location: Patient: [facility]. Provider: Laneta Speaks, RN   I discussed the limitations, risks, security and privacy concerns of performing an evaluation and management service by telephone and the availability of in person appointments. I also discussed with the patient that there may be a patient responsible charge related to this service. The patient expressed understanding and agreed to proceed.   Shared Decision Making Visit Lung Cancer Screening Program [phone])   Eligibility: Age 63 y.o. Pack Years Smoking History Calculation 58 (# packs/per year x # years smoked) Recent History of coughing up blood  no Unexplained weight loss? no ( >Than 15 pounds within the last 6 months ) Prior History Lung / other cancer no (Diagnosis within the last 5 years already requiring surveillance chest CT Scans). Smoking Status Former Smoker Former Smokers: Years since quit: 4 years  Quit Date: 2021  Visit Components: Discussion included one or more decision making aids. yes Discussion included risk/benefits of screening. yes Discussion included potential follow up diagnostic testing for abnormal scans. yes Discussion included meaning and risk of over diagnosis. yes Discussion included meaning and risk of False Positives. yes Discussion included meaning of total radiation exposure. yes  Counseling Included: Importance of adherence to annual lung cancer LDCT screening. yes Impact of comorbidities on ability to participate in the program. yes Ability and willingness to under diagnostic treatment. yes  Smoking Cessation Counseling: Current Smokers:  Discussed importance of smoking cessation. yes Information about tobacco cessation classes and interventions provided to patient. yes Patient  provided with ticket for LDCT Scan. no Symptomatic Patient. no  Counseling(Intermediate counseling: > three minutes) [id number] Diagnosis Code: Tobacco Use Z72.0 Asymptomatic Patient yes  Counseling (Intermediate counseling: > three minutes counseling) [id number]  Counseled patient 3-4 minutes regarding tobacco use.   Former Smokers:  Discussed the importance of maintaining cigarette abstinence. yes Diagnosis Code: Personal History of Nicotine Dependence. S12.108 Information about tobacco cessation classes and interventions provided to patient. Yes Patient provided with ticket for LDCT Scan. no Written Order for Lung Cancer Screening with LDCT placed in Epic. Yes (CT Chest Lung Cancer Screening Low Dose W/O CM) [id number] Z12.2-Screening of respiratory organs Z87.891-Personal history of nicotine dependence   Laneta Speaks, RN

## 2024-07-15 NOTE — Patient Instructions (Signed)

## 2024-07-19 NOTE — Progress Notes (Signed)
 Office Visit Note  Patient: Frederick Robinson.             Date of Birth: [birth year]           MRN: [id number]             PCP: Jesus Bernardino MATSU, MD Referring: Jesus Bernardino MATSU, MD Visit Date: 07/20/2024  Subjective:  New Patient (Initial Visit) (Pain and auto immune issues.)   Discussed the use of AI scribe software for clinical note transcription with the patient, who gave verbal consent to proceed.  History of Present Illness   Frederick Evitt. is a 63 year old male with hypothyroidism, pernicious anemia, and type 2 diabetes here for evaluation of arthritis symptoms concern for possible inflammatory disease.  He experiences severe pain in his shoulders, fingers, and elbows, primarily during sleep. The pain is described as feeling like 'a knife' between his fingers and is alleviated by straightening his arm or fingers. The pain is mostly in the upper body, but he also experiences numbness due to neuropathy, which has progressed to his knees. He has not used splinting or bracing devices for sleep but finds that avoiding sleeping with bent arms helps reduce pain. He experiences stiffness in the morning.  He has a history of type 2 diabetes for 25 years, with previous A1C levels in the 8.5-9 range, now controlled to less than 6.5 for the past 18 months with the use of a Dexcom device. Neuropathy symptoms include numbness in his feet and knees, and he has started experiencing tingling and numbness in his hands. He suspects neuropathy may be affecting his balance. He has tried over-the-counter medications for neuropathy with limited effect.  He has hypothyroidism diagnosed at age 67, with a family history on his mother's side. His TSH levels have recently increased significantly, from 15 to 60. He is currently taking a brand of levothyroxine, but notes variability in his thyroid  function compared to when he was on Synthroid. He has experienced muscle pain and weakness, which he associates with  his hypothyroidism.  He was diagnosed with pernicious anemia last year, experiencing fatigue and B12 deficiency. He initially did not respond to B12 supplements due to absorption issues, but found improvement with sublingual B12 supplements. He also has gastritis, which may affect his medication absorption.  He has had a torn meniscus and a tendon repair in his leg, but no fractures or significant joint injuries. He has a heel nodule, which he has lived with for years without pain. He has no history of arthritis in his family, but his grandmother had enlarged knuckles and his father and aunt had Parkinson's disease.  He has a sedentary lifestyle, previously working as an Art gallery manager, and now spends more time walking since his son was born 3 years ago. He has a history of sleep apnea, with mild symptoms noted during a sleep study, but does not use a CPAP machine.       Activities of Daily Living:  Patient reports morning stiffness for 60 minutes.   Patient Reports nocturnal pain.  Difficulty dressing/grooming: Denies Difficulty climbing stairs: Denies Difficulty getting out of chair: Denies Difficulty using hands for taps, buttons, cutlery, and/or writing: Denies  Review of Systems  Constitutional:  Positive for fatigue.  HENT:  Negative for mouth sores and mouth dryness.   Eyes:  Negative for dryness.  Respiratory:  Negative for shortness of breath.   Cardiovascular:  Negative for chest pain and palpitations.  Gastrointestinal:  Negative for blood in stool, constipation and diarrhea.  Endocrine: Negative for increased urination.  Genitourinary:  Negative for involuntary urination.  Musculoskeletal:  Positive for joint pain, gait problem, joint pain, myalgias, morning stiffness, muscle tenderness and myalgias. Negative for joint swelling and muscle weakness.  Skin:  Positive for color change. Negative for rash, hair loss and sensitivity to sunlight.  Allergic/Immunologic: Positive for  susceptible to infections.  Neurological:  Positive for headaches. Negative for dizziness.  Hematological:  Negative for swollen glands.  Psychiatric/Behavioral:  Negative for depressed mood and sleep disturbance. The patient is not nervous/anxious.     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   Iron deficiency 01/24/2024   Gastric atrophy 11/01/2023   OSA (obstructive sleep apnea) 10/30/2023   Colon polyps 10/21/2023   Gastritis 10/21/2023   Hx of adenomatous colonic polyps 10/14/2023   Gastric intestinal metaplasia without dysplasia 10/14/2023   Pernicious anemia 08/19/2023   Secondary hypertension 08/18/2023   DM (diabetes mellitus), type 2 with neurological complications (HCC) 08/18/2023   GERD without esophagitis 08/18/2023   Hepatic steatosis 07/27/2023   BPH (benign prostatic hyperplasia) 07/27/2023   Vitamin B 12 deficiency 07/27/2023   B12 deficiency anemia 07/14/2023   Macrocytic anemia 07/09/2023   Vitamin D  deficiency 07/09/2023   Low serum parathyroid  hormone (PTH) 07/09/2023   Foot lesion 07/09/2023   Leg injury 06/22/2023   Family history of prostate cancer 06/22/2023   Apathetic 06/22/2023   CKD (chronic kidney disease) 06/22/2023   Snoring 06/22/2023   Memory loss 06/22/2023   Tendon rupture, post-op 06/12/2023   Tear of medial meniscus of knee 05/03/2023   Peroneal tendon rupture, left, sequela 04/30/2023   Pain of joint of left  ankle and foot 04/30/2023   Acquired cavovarus deformity of left foot 04/30/2023   History of melanoma 01/30/2023   Former smoker 07/12/2013   Resistant hypertension 09/12/2008   Allergic rhinitis 09/23/2007   Diabetes mellitus, type 2 (HCC) 05/13/2007   Hypothyroidism 05/10/2007   Hyperlipidemia associated with type 2 diabetes mellitus (HCC) 05/10/2007    Past Medical History:  Diagnosis Date   Arthritis 01/30/2023   B12 deficiency 07/27/2023   Coronary artery calcification    Diabetes mellitus without complication (HCC)    Gastric intestinal metaplasia without dysplasia 10/14/2023   Hx of adenomatous colonic polyps 10/14/2023   Hyperlipidemia    Hypertension    Melanoma (HCC)    Thyroid  disease     Family History  Problem Relation Age of Onset   Heart disease Mother    Cancer Mother    Kidney disease Mother    Heart disease Father    Heart attack Father    Cancer Father        liver cancer   Diabetes Father    Obesity Father    Cancer Brother 81   Bladder Cancer Brother    Autism Son    Past Surgical History:  Procedure Laterality Date   COLONOSCOPY N/A 11/07/2014   Procedure: COLONOSCOPY;  Surgeon:  Oneil DELENA Budge, MD;  Location: [facility];  Service: Gastroenterology;  Laterality: N/A;  830   FRACTURE SURGERY  06/11/2023   KNEE SURGERY Right    REPAIR OF PERONEUS BREVIS TENDON Left 06/11/2023   Procedure: REPAIR OF PERONEUS BREVIS TENOLYSIS;  Surgeon: Kit Rush, MD;  Location: [facility];  Service: Orthopedics;  Laterality: Left;   TENDON REPAIR Left 06/11/2023   Procedure: LEFT PERONEUS LONGUS REPAIR;  Surgeon: Kit Rush, MD;  Location: [facility];  Service: Orthopedics;  Laterality: Left;   Social History   Social History Narrative   Not on file   Immunization History  Administered Date(s) Administered   Influenza Split 09/10/2013, 09/16/2023   Influenza Whole 08/21/2008, 08/28/2009, 08/29/2009   Influenza,  Seasonal, Injecte, Preservative Fre 10/11/2014   Influenza,inj,Quad PF,6+ Mos 08/31/2015, 11/13/2018   Moderna Sars-Covid-2 Vaccination 09/16/2023   Pneumococcal Polysaccharide-23 02/15/2018   Td 11/01/2008   Tdap 10/01/2007, 02/15/2018   Zoster Recombinant(Shingrix ) 07/22/2023     Objective: Vital Signs: BP 125/63 (BP Location: Right Arm, Patient Position: Sitting, Cuff Size: Normal)   Pulse (!) 58   Resp 16   Ht 6' 3.75 (1.924 m)   Wt 241 lb 12.8 oz (109.7 kg)   BMI 29.63 kg/m    Physical Exam HENT:     Mouth/Throat:     Mouth: Mucous membranes are moist.     Pharynx: Oropharynx is clear.  Eyes:     Conjunctiva/sclera: Conjunctivae normal.  Cardiovascular:     Rate and Rhythm: Normal rate and regular rhythm.  Pulmonary:     Effort: Pulmonary effort is normal.     Breath sounds: Normal breath sounds.  Musculoskeletal:     Right lower leg: No edema.     Left lower leg: No edema.  Lymphadenopathy:     Cervical: No cervical adenopathy.  Skin:    General: Skin is warm and dry.     Findings: No rash.  Neurological:     Mental Status: He is alert.  Psychiatric:        Mood and Affect: Mood normal.      Musculoskeletal Exam:  Left shoulder slight restriction with full overhead abduction, mild tenderness Elbows full ROM, mild right tenderness to pressure, no swelling Early dupuytren's contracture proximal to left 4th MCP Fingers full ROM no tenderness or swelling No paraspinal tenderness to palpation over upper and lower back Hip normal internal and external rotation without pain, no tenderness to lateral hip palpation Knees full ROM no tenderness or swelling   Investigation: No additional findings.  Imaging: CT CHEST LUNG CA SCREEN LOW DOSE W/O CM Result Date: 08/02/2024 CLINICAL DATA:  63 year old male with 41 pack-year history of smoking. Lung cancer screening. EXAM: CT CHEST WITHOUT CONTRAST LOW-DOSE FOR LUNG CANCER SCREENING TECHNIQUE: Multidetector CT  imaging of the chest was performed following the standard protocol without IV contrast. RADIATION DOSE REDUCTION: This exam was performed according to the departmental dose-optimization program which includes automated exposure control, adjustment of the mA and/or kV according to patient size and/or use of iterative reconstruction technique. COMPARISON:  None Available. FINDINGS: Cardiovascular: The heart size is normal. No substantial pericardial effusion. Coronary artery calcification is evident. Mild atherosclerotic calcification is noted in the wall of the thoracic aorta. Mediastinum/Nodes: No mediastinal lymphadenopathy. No evidence for gross hilar lymphadenopathy although assessment is limited by the lack of intravenous contrast on the current study. The esophagus has normal imaging features. There is no axillary lymphadenopathy. Lungs/Pleura: Centrilobular  and paraseptal emphysema evident. Scattered tiny bilateral pulmonary nodules identified. No suspicious pulmonary nodule or mass. No focal airspace consolidation. No pleural effusion. Upper Abdomen: Visualized portion of the upper abdomen shows no acute findings. Musculoskeletal: No worrisome lytic or sclerotic osseous abnormality. IMPRESSION: Lung-RADS 2, benign appearance or behavior. Continue annual screening with low-dose chest CT without contrast in 12 months. Aortic Atherosclerosis (ICD10-I70.0) and Emphysema (ICD10-J43.9). Electronically Signed   By: Camellia Candle M.D.   On: 08/02/2024 12:32    Recent Labs: Lab Results  Component Value Date   WBC 4.0 06/16/2024   HGB 13.7 06/16/2024   PLT 201.0 06/16/2024   NA 139 07/07/2024   K 4.5 07/07/2024   CL 104 07/07/2024   CO2 26 07/07/2024   GLUCOSE 123 (H) 07/07/2024   BUN 23 07/07/2024   CREATININE 1.62 (H) 07/07/2024   BILITOT 0.4 06/16/2024   ALKPHOS 36 (L) 06/16/2024   AST 27 06/16/2024   ALT 23 06/16/2024   PROT 7.1 06/16/2024   ALBUMIN 4.7 07/07/2024   CALCIUM  9.8 07/07/2024    GFRAA 72 02/15/2018    Speciality Comments: No specialty comments available.  Procedures:  No procedures performed Allergies: Jardiance [empagliflozin] and Pravastatin sodium   Assessment / Plan:     Visit Diagnoses: Undifferentiated inflammatory arthritis  Bilateral hand pain  - Plan: Sedimentation rate, C3 and C4, Cyclic citrul peptide antibody, IgG, CK, XR Hand 2 View Right, XR Hand 2 View Left Symptoms likely related to tendon and soft tissue involvement with mild extent of bony arthritis. No appreciable peripheral joint synovitis. Previously have negative antibody testing with PCP office. As a result lower pretest suspicion - Order plain x-rays of hands and left shoulder. - Perform additional blood tests, including serum complements and sedimentation rate. - Provide information on splinting and range of motion exercises. - Consider ultrasound for further evaluation if needed, if serology negative consider orthopedic consultation  Type 2 diabetes mellitus with diabetic polyneuropathy Long-standing type 2 diabetes mellitus with diabetic polyneuropathy. Neuropathy attributed to past poor glycemic control, though recent A1c levels improved to below 6.5 with Dexcom use. Neuropathy not currently associated with pain.  Hypothyroidism, poorly controlled Poorly controlled hypothyroidism with TSH level of 60. Concerns about levothyroxine absorption due to gastritis and potential interactions with other medications. High TSH may contribute to muscle pain, weakness, and affect reflexes.  Vitamin B12 deficiency anemia (pernicious anemia) Vitamin B12 deficiency anemia due to intrinsic factor deficiency. Previously had absorption issues with oral B12 supplements, now using sublingual B12, with some improvement in B12 levels but ongoing symptoms.  Chronic kidney disease, stage 3a Stage 3a chronic kidney disease. Recent lab tests showed decreased GFR, possibly related to poorly controlled  hypothyroidism. Not a good candidate for long term NSAID exposure for multiple reasons.  Bilateral calcaneal bone spurs (heel spurs), asymptomatic Asymptomatic bilateral calcaneal bone spurs.   Orders: Orders Placed This Encounter  Procedures   XR Hand 2 View Right   XR Hand 2 View Left   XR Shoulder Left   Sedimentation rate   C3 and C4   Cyclic citrul peptide antibody, IgG   CK   No orders of the defined types were placed in this encounter.    Follow-Up Instructions: Return in about 4 weeks (around 08/17/2024) for New pt f/u ~39mo.   Lonni LELON Ester, MD  Note - This record has been created using [employer].  Chart creation errors have been sought, but may not always  have been located. Such  creation errors do not reflect on  the standard of medical care.

## 2024-07-20 ENCOUNTER — Ambulatory Visit: Payer: Self-pay

## 2024-07-20 ENCOUNTER — Encounter: Payer: Self-pay | Admitting: Internal Medicine

## 2024-07-20 ENCOUNTER — Ambulatory Visit

## 2024-07-20 ENCOUNTER — Ambulatory Visit (HOSPITAL_COMMUNITY)

## 2024-07-20 ENCOUNTER — Ambulatory Visit (INDEPENDENT_AMBULATORY_CARE_PROVIDER_SITE_OTHER): Admitting: Internal Medicine

## 2024-07-20 ENCOUNTER — Ambulatory Visit (HOSPITAL_COMMUNITY)
Admission: RE | Admit: 2024-07-20 | Discharge: 2024-07-20 | Disposition: A | Discharge status: Home / Self Care | Source: Ambulatory Visit | Attending: Acute Care | Admitting: Acute Care

## 2024-07-20 VITALS — BP 125/63 | HR 58 | Resp 16 | Ht 75.75 in | Wt 241.8 lb

## 2024-07-20 DIAGNOSIS — M25512 Pain in left shoulder: Secondary | ICD-10-CM

## 2024-07-20 DIAGNOSIS — N1831 Chronic kidney disease, stage 3a: Secondary | ICD-10-CM

## 2024-07-20 DIAGNOSIS — M720 Palmar fascial fibromatosis [Dupuytren]: Secondary | ICD-10-CM | POA: Insufficient documentation

## 2024-07-20 DIAGNOSIS — M79642 Pain in left hand: Secondary | ICD-10-CM | POA: Diagnosis not present

## 2024-07-20 DIAGNOSIS — Z87891 Personal history of nicotine dependence: Secondary | ICD-10-CM | POA: Diagnosis not present

## 2024-07-20 DIAGNOSIS — M79641 Pain in right hand: Secondary | ICD-10-CM | POA: Insufficient documentation

## 2024-07-20 DIAGNOSIS — E039 Hypothyroidism, unspecified: Secondary | ICD-10-CM | POA: Diagnosis not present

## 2024-07-20 DIAGNOSIS — I10 Essential (primary) hypertension: Secondary | ICD-10-CM | POA: Diagnosis not present

## 2024-07-20 DIAGNOSIS — I2510 Atherosclerotic heart disease of native coronary artery without angina pectoris: Secondary | ICD-10-CM | POA: Diagnosis not present

## 2024-07-20 DIAGNOSIS — M1990 Unspecified osteoarthritis, unspecified site: Secondary | ICD-10-CM | POA: Diagnosis not present

## 2024-07-20 DIAGNOSIS — G8929 Other chronic pain: Secondary | ICD-10-CM | POA: Diagnosis not present

## 2024-07-20 DIAGNOSIS — Z122 Encounter for screening for malignant neoplasm of respiratory organs: Secondary | ICD-10-CM | POA: Insufficient documentation

## 2024-07-20 DIAGNOSIS — E1165 Type 2 diabetes mellitus with hyperglycemia: Secondary | ICD-10-CM | POA: Diagnosis not present

## 2024-07-20 DIAGNOSIS — D510 Vitamin B12 deficiency anemia due to intrinsic factor deficiency: Secondary | ICD-10-CM | POA: Diagnosis not present

## 2024-07-20 DIAGNOSIS — J432 Centrilobular emphysema: Secondary | ICD-10-CM | POA: Diagnosis not present

## 2024-07-20 DIAGNOSIS — E1140 Type 2 diabetes mellitus with diabetic neuropathy, unspecified: Secondary | ICD-10-CM | POA: Diagnosis not present

## 2024-07-20 DIAGNOSIS — E7800 Pure hypercholesterolemia, unspecified: Secondary | ICD-10-CM | POA: Diagnosis not present

## 2024-07-21 ENCOUNTER — Encounter: Admitting: Internal Medicine

## 2024-07-21 LAB — C3 AND C4
C3 Complement: 168 mg/dL (ref 82–185)
C4 Complement: 31 mg/dL (ref 15–53)

## 2024-07-21 LAB — SEDIMENTATION RATE: Sed Rate: 2 mm/h (ref 0–20)

## 2024-07-21 LAB — CYCLIC CITRUL PEPTIDE ANTIBODY, IGG: Cyclic Citrullin Peptide Ab: <16 U

## 2024-07-21 LAB — CK: Total CK: 211 U/L (ref 22–308)

## 2024-07-22 ENCOUNTER — Ambulatory Visit: Payer: Self-pay | Admitting: Internal Medicine

## 2024-07-22 DIAGNOSIS — Z23 Encounter for immunization: Secondary | ICD-10-CM | POA: Diagnosis not present

## 2024-07-22 NOTE — Progress Notes (Signed)
 Lab results look good his other antibody test for rheumatoid arthritis is negative and inflammatory markers are normal.  His x-rays show osteoarthritis this looks worse in the shoulder than in his hands.  As of now I do not see any specific signs of an inflammatory process.

## 2024-07-25 ENCOUNTER — Other Ambulatory Visit: Payer: Self-pay | Admitting: *Deleted

## 2024-07-25 DIAGNOSIS — M79641 Pain in right hand: Secondary | ICD-10-CM

## 2024-07-25 DIAGNOSIS — G8929 Other chronic pain: Secondary | ICD-10-CM

## 2024-07-25 NOTE — Progress Notes (Signed)
 He could cancel his follow up here. He may also benefit to see orthopedics if symptoms get any worse considering his osteoarthritis.

## 2024-07-29 ENCOUNTER — Ambulatory Visit: Attending: Cardiology | Admitting: Cardiology

## 2024-07-29 ENCOUNTER — Encounter: Payer: Self-pay | Admitting: Cardiology

## 2024-07-29 VITALS — BP 128/60 | HR 55 | Resp 16 | Ht 75.0 in | Wt 243.0 lb

## 2024-07-29 DIAGNOSIS — I2510 Atherosclerotic heart disease of native coronary artery without angina pectoris: Secondary | ICD-10-CM | POA: Diagnosis not present

## 2024-07-29 DIAGNOSIS — E781 Pure hyperglyceridemia: Secondary | ICD-10-CM

## 2024-07-29 DIAGNOSIS — I10 Essential (primary) hypertension: Secondary | ICD-10-CM | POA: Diagnosis not present

## 2024-07-29 DIAGNOSIS — E7800 Pure hypercholesterolemia, unspecified: Secondary | ICD-10-CM

## 2024-07-29 DIAGNOSIS — I2584 Coronary atherosclerosis due to calcified coronary lesion: Secondary | ICD-10-CM | POA: Diagnosis not present

## 2024-07-29 DIAGNOSIS — E1165 Type 2 diabetes mellitus with hyperglycemia: Secondary | ICD-10-CM

## 2024-07-29 DIAGNOSIS — Z794 Long term (current) use of insulin: Secondary | ICD-10-CM

## 2024-07-29 MED ORDER — CARVEDILOL 6.25 MG PO TABS: 6.2500 mg | ORAL_TABLET | Freq: Two times a day (BID) | ORAL | 3 refills | Status: AC

## 2024-07-29 NOTE — Patient Instructions (Signed)
 Medication Instructions:  Your physician has recommended you make the following change in your medication: Decrease carvedilol  to 6.25 mg by mouth twice daily  *If you need a refill on your cardiac medications before your next appointment, please call your pharmacy*  Lab Work: none If you have labs (blood work) drawn today and your tests are completely normal, you will receive your results only by: MyChart Message (if you have MyChart) OR A paper copy in the mail If you have any lab test that is abnormal or we need to change your treatment, we will call you to review the results.  Testing/Procedures: none  Follow-Up: At [facility], you and your health needs are our priority.  As part of our continuing mission to provide you with exceptional heart care, our providers are all part of one team.  This team includes your primary Cardiologist (physician) and Advanced Practice Providers or APPs (Physician Assistants and Nurse Practitioners) who all work together to provide you with the care you need, when you need it.  Your next appointment:   12 month(s)  Provider:   Madonna Large, DO    We recommend signing up for the patient portal called MyChart.  Sign up information is provided on this After Visit Summary.  MyChart is used to connect with patients for Virtual Visits (Telemedicine).  Patients are able to view lab/test results, encounter notes, upcoming appointments, etc.  Non-urgent messages can be sent to your provider as well.   To learn more about what you can do with MyChart, go to [email or web address].   Other Instructions

## 2024-07-29 NOTE — Progress Notes (Signed)
 Cardiology Office Note:  .   Date:  07/29/2024  ID:  Frederick CHRISTELLA Fulton Mickey., DOB 10-05-1961, MRN [id number] PCP:  Jesus Bernardino MATSU, MD  Former Cardiology Providers: NA [facility] Providers Cardiologist:  Madonna Large, DO , [facility]  Electrophysiologist:  None  Click to update primary MD,subspecialty MD or APP then REFRESH:1}    Chief Complaint  Patient presents with   Coronary atherosclerosis due to severely calcified coronary   Follow-up    History of Present Illness: .   Frederick CHRISTELLA Fulton Mickey. is a 63 y.o. Caucasian male whose past medical history and cardiovascular risk factors includes: Pernicious anemia, Severe coronary artery calcification, former smoker, hypertriglyceridemia, insulin-dependent diabetes mellitus type 2, hypertension, erectile dysfunction, hyperlipidemia.   Patient was referred to the practice for evaluation of CAD given his multiple cardiovascular risk factors.  His cardiovascular workup noted severe CAC with a total score of 628, placing him at the 92nd percentile.  Since then he has undergone appropriate ischemic workup as outlined below and since then we have been focusing on improving his modifiable cardiovascular risk factors which includes pure hypercholesterolemia and hypertriglyceridemia.  He presents today for 18-month follow-up visit.  Since last office visit patient denies any anginal chest pain or heart failure symptoms.  No hospitalizations or urgent care visits for cardiovascular reasons.  He has been compliant with his medical therapy. Physical endurance remains stable, go to the gym 3 times a week for cardio and strength training. Home SBP ranges between 120-130 mmHg.   Review of Systems: .   Review of Systems  Cardiovascular:  Negative for chest pain, claudication, irregular heartbeat, leg swelling, near-syncope, orthopnea, palpitations, paroxysmal nocturnal dyspnea and syncope.  Respiratory:  Negative for shortness of breath.   Hematologic/Lymphatic:  Negative for bleeding problem.    Studies Reviewed:   EKG: EKG Interpretation Date/Time:  Friday July 29 2024 13:26:09 EDT Text Interpretation: Sinus bradycardia with 1st degree A-V block When compared with ECG of 26-Oct-2023 13:00, Nonspecific T wave abnormality No significant change since last tracing dated 10/26/2023 Confirmed by Large Madonna [phone]) on 07/29/2024 1:30:32 PM  Echocardiogram: 02/03/2022: Left ventricle cavity is normal in size. Mild concentric hypertrophy of the left ventricle. Normal global wall motion. Normal LV systolic function with EF 64%. Normal diastolic filling pattern. Mildly increased LVOT velocity. No significant valvular stenosis. No significant valvular stenosis. Normal right atrial pressure.   Stress Testing: Lexiscan  (with Mod Bruce protocol) Nuclear stress test 06/25/2022   Myocardial perfusion is normal. Overall LV systolic function is normal without regional wall motion abnormalities.  Stress LV EF: 60%.  Nondiagnostic ECG stress. The heart rate response was consistent with Regadenoson .  The blood pressure response was physiologic. No previous exam available for comparison. Low risk study.   Heart Catheterization: None   Calcium  Scoring.  02/25/2022: Left Main: 33.8 LAD: 239 LCx: 62.2 RCA: 292 Total Agatston Score: 628   MESA database percentile: 92nd Three-vessel coronary atherosclerotic calcifications. The observed calcium  score of 628 is at the 92nd percentile for subjects of the same age, gender, and race/ethnicity who are free of clinical cardiovascular disease and treated diabetes.   Renal artery duplex  03/20/2022:  No evidence of renal artery occlusive disease in either renal artery. Mild increase in bilateral resistivity index and suggests medico-renal disease.  Renal length is within normal limits for both kidneys.  Normal abdominal aorta flow velocities noted.  RADIOLOGY: NA  Risk Assessment/Calculations:   NA   Labs:  Latest Ref Rng & Units 06/16/2024    9:24 AM 05/06/2024   11:43 AM 01/22/2024   10:59 AM  CBC  WBC 4.0 - 10.5 K/uL 4.0  3.9  5.1   Hemoglobin 13.0 - 17.0 g/dL 86.2  86.8  85.5   Hematocrit 39.0 - 52.0 % 41.9  38.6  42.9   Platelets 150.0 - 400.0 K/uL 201.0  189.0  251.0        Latest Ref Rng & Units 07/07/2024    9:24 AM 06/16/2024    9:24 AM 05/06/2024   11:43 AM  BMP  Glucose 70 - 99 mg/dL 876  884  695   BUN 6 - 23 mg/dL 23  28  17    Creatinine 0.40 - 1.50 mg/dL 8.37  8.26  8.56   Sodium 135 - 145 mEq/L 139  143  138   Potassium 3.5 - 5.1 mEq/L 4.5  4.4  4.4   Chloride 96 - 112 mEq/L 104  108  105   CO2 19 - 32 mEq/L 26  26  25    Calcium  8.4 - 10.5 mg/dL 9.8  9.4  8.9       Latest Ref Rng & Units 07/07/2024    9:24 AM 06/16/2024    9:24 AM 05/06/2024   11:43 AM  CMP  Glucose 70 - 99 mg/dL 876  884  695   BUN 6 - 23 mg/dL 23  28  17    Creatinine 0.40 - 1.50 mg/dL 8.37  8.26  8.56   Sodium 135 - 145 mEq/L 139  143  138   Potassium 3.5 - 5.1 mEq/L 4.5  4.4  4.4   Chloride 96 - 112 mEq/L 104  108  105   CO2 19 - 32 mEq/L 26  26  25    Calcium  8.4 - 10.5 mg/dL 9.8  9.4  8.9   Total Protein 6.0 - 8.3 g/dL  7.1  6.6   Total Bilirubin 0.2 - 1.2 mg/dL  0.4  0.4   Alkaline Phos 39 - 117 U/L  36  33   AST 0 - 37 U/L  27  36   ALT 0 - 53 U/L  23  34     Lab Results  Component Value Date   CHOL 157 06/16/2024   HDL 61.50 06/16/2024   LDLCALC 51 06/16/2024   LDLDIRECT 73 08/17/2023   TRIG 222.0 (H) 06/16/2024   CHOLHDL 3 06/16/2024   No results for input(s): LIPOA in the last 8760 hours. No components found for: NTPROBNP No results for input(s): PROBNP in the last 8760 hours. Recent Labs    05/06/24 1143 06/16/24 0924  TSH 0.33* 14.29*    External Labs:  Date Collected: 07/30/2021 , information obtained by referring physician Potassium: 4.5 Creatinine 1.10 mg/dL. eGFR: 72 mL/min per 1.73 m Lipid profile: Total cholesterol 232 , triglycerides 384 , HDL  48 , LDL 107, non-HDL 184 AST: 40 , ALT: 55 , alkaline phosphatase: 56  Hemoglobin A1c: 8.7 TSH: 8.12    Lipid Panel   2022-02-06    Cholesterol 189   <200  Cholesterol / HDL Ratio 6.10   0.00-4.99  HDL Cholesterol 31   >39  LDL Cholesterol (Calculation) SEE COMMENT   <130  LDL/HDL Ratio SEE COMMENT   <3.3  Non-HDL Cholesterol 158   <130  Triglycerides 413   <150   Lipid Panel Reviewed date:01/01/2023 07:55:42 AM Interpretation: Performing [facility], [address], [place], [place] [id number], Phone -  [phone], Director - MDNagendra Notes/Report:  Cholesterol, Total 190 100-199 mg/dL    Triglycerides 685 9-850 mg/dL    HDL Cholesterol 45 >60 mg/dL    VLDL Cholesterol Cal 52 5-40 mg/dL    LDL Chol Calc (NIH) 93 0-99 mg/dL    External Labs: Collected: July 15, 2024 [facility] database. Total cholesterol 196, triglycerides 180, HDL 56, LDL 109  Physical Exam:    Today's Vitals   07/29/24 1324  BP: 128/60  Pulse: (!) 55  Resp: 16  SpO2: 98%  Weight: 243 lb (110.2 kg)  Height: 6' 3 (1.905 m)   Body mass index is 30.37 kg/m. Wt Readings from Last 3 Encounters:  07/29/24 243 lb (110.2 kg)  07/20/24 241 lb 12.8 oz (109.7 kg)  07/07/24 238 lb 9.6 oz (108.2 kg)    Physical Exam  Constitutional: No distress.  Age appropriate, hemodynamically stable.   Neck: No JVD present.  Cardiovascular: Regular rhythm, S1 normal, S2 normal, intact distal pulses and normal pulses. Bradycardia present. Exam reveals no gallop, no S3 and no S4.  No murmur heard. Pulses:      Dorsalis pedis pulses are 2+ on the right side and 2+ on the left side.       Posterior tibial pulses are 2+ on the right side and 2+ on the left side.  Pulmonary/Chest: Effort normal and breath sounds normal. No stridor. He has no wheezes. He has no rales.  Abdominal: Soft. Bowel sounds are normal. He exhibits no distension. There is no abdominal tenderness.  Musculoskeletal:        General: No edema.      Cervical back: Neck supple.  Neurological: He is alert and oriented to person, place, and time. He has intact cranial nerves (2-12).  Skin: Skin is warm and moist.    Impression & Recommendation(s):  Impression:   ICD-10-CM   1. Coronary artery disease due to calcified coronary lesion  I25.10 EKG 12-Lead   I25.84 carvedilol  (COREG ) 6.25 MG tablet    2. Pure hypercholesterolemia  E78.00     3. Hypertriglyceridemia  E78.1     4. Type 2 diabetes mellitus with hyperglycemia, with long-term current use of insulin (HCC)  E11.65    Z79.4     5. Benign hypertension  I10 carvedilol  (COREG ) 6.25 MG tablet       Recommendation(s):  Coronary atherosclerosis due to severely calcified coronary lesion Total CAC 628, 92nd percentile. Currently on aspirin 81 mg p.o. daily. Continue Repatha . Continue fenofibrate. Continue Vascepa . Continue Crestor 40 mg p.o. nightly. EKG nonischemic but illustrates a prolonged first-degree AV block. Overall function capacity remains relatively stable. No additional cardiovascular workup warranted at this time. Reemphasize importance of secondary prevention.  Pure hypercholesterolemia Currently on Crestor 40 mg p.o. nightly, Repatha .   Most recent lipid profile from August 2025 independently reviewed, LDL is acceptable at 109 mg/dL and triglycerides have improved to 180 mg/dL when compared to prior levels. He denies myalgia or other side effects.  Hypertriglyceridemia Improved. Prior levels were 201 mg/dL now 819 mg/dL Currently on Vascepa  1 g 2 tabs twice a day. Currently on fenofibrate 200 mg p.o. daily Reemphasized importance of improving his modifiable cardiovascular risk factors and dietary restrictions.  Type 2 diabetes mellitus with hyperglycemia, with long-term current use of insulin (HCC) Reemphasized the importance of glycemic control. Currently on ARB, statin lipid-lowering agents, Vascepa  and fenofibrate.  Benign hypertension Office  blood pressures are very well-controlled. Continue amlodipine  10 mg p.o. daily. Decrease carvedilol  from  12.5 mg p.o. twice daily to 6.25 mg p.o. twice daily Continue Kerendia  20 mg p.o. daily. Continue Farxiga 10 mg p.o. daily Continue BiDil 20/37.5 mg p.o. 3 times daily. Continue losartan  100 mg p.o. daily Reemphasized importance of low-salt diet.  Orders Placed:  Orders Placed This Encounter  Procedures   EKG 12-Lead   Final Medication List:    Meds ordered this encounter  Medications   carvedilol  (COREG ) 6.25 MG tablet    Sig: Take 1 tablet (6.25 mg total) by mouth 2 (two) times daily.    Dispense:  180 tablet    Refill:  3    Dose decrease    Medications Discontinued During This Encounter  Medication Reason   omeprazole  (PRILOSEC) 40 MG capsule Patient Preference   carvedilol  (COREG ) 12.5 MG tablet      Current Outpatient Medications:    Alpha-Lipoic Acid 600 MG TABS, Take 600 mg by mouth daily. If a formulation with BIOTIN  exists , please dispense 600 mg Alpha lipoic fatty acid and 660 mg biotin  per tab or capsule., Disp: 60 tablet, Rfl: 5   amLODipine  (NORVASC ) 10 MG tablet, Take 1 tablet (10 mg total) by mouth daily at 10 pm., Disp: 90 tablet, Rfl: 3   Ascorbic Acid 500 MG CAPS, as directed Orally, Disp: , Rfl:    aspirin 81 MG tablet, Take 81 mg by mouth daily., Disp: , Rfl:    B-D UF III MINI PEN NEEDLES 31G X 5 MM MISC, USE AS DIRECTED, Disp: 100 each, Rfl: 5   carvedilol  (COREG ) 6.25 MG tablet, Take 1 tablet (6.25 mg total) by mouth 2 (two) times daily., Disp: 180 tablet, Rfl: 3   Cholecalciferol (VITAMIN D3) 25 MCG (1000 UT) CAPS, 1 capsule., Disp: , Rfl:    Cobalamin Combinations (B-12) 1000-400 MCG SUBL, Place 1 tablet under the tongue daily., Disp: 90 tablet, Rfl: 3   Continuous Glucose Sensor (DEXCOM G6 SENSOR) MISC, USE AS DIRECTED. NEED TO REPLACE EVERY 10 DAYS., Disp: 9 each, Rfl: 3   Continuous Glucose Transmitter (DEXCOM G6 TRANSMITTER) MISC, USE AS  DIRECTED, Disp: 1 each, Rfl: 0   dapagliflozin propanediol (FARXIGA) 10 MG TABS tablet, Take by mouth daily., Disp: , Rfl:    diclofenac  Sodium (VOLTAREN ) 1 % GEL, USE AS DIRECTED, Disp: 50 g, Rfl: 2   Evolocumab  (REPATHA  SURECLICK) 140 MG/ML SOAJ, Inject 140 mg into the skin every 14 (fourteen) days., Disp: 6 mL, Rfl: 3   fenofibrate micronized (LOFIBRA) 200 MG capsule, daily before breakfast., Disp: , Rfl:    ferrous sulfate 325 (65 FE) MG tablet, 1 tablet Orally Three times a Week, Disp: , Rfl:    Finerenone  (KERENDIA ) 20 MG TABS, Take 1 tablet (20 mg total) by mouth daily., Disp: 30 tablet, Rfl: 3   ibuprofen (ADVIL) 400 MG tablet, as needed., Disp: , Rfl:    icosapent  Ethyl (VASCEPA ) 1 g capsule, Take 2 capsules (2 g total) by mouth 2 (two) times daily., Disp: 360 capsule, Rfl: 32   isosorbide-hydrALAZINE  (BIDIL) 20-37.5 MG tablet, Take 1 tablet by mouth 3 (three) times daily., Disp: 270 tablet, Rfl: 3   levothyroxine (SYNTHROID) 112 MCG tablet, Take 224 mcg by mouth daily., Disp: , Rfl:    loratadine (CLARITIN) 10 MG tablet, Take 10 mg by mouth daily., Disp: , Rfl:    losartan  (COZAAR ) 100 MG tablet, Take 1 tablet (100 mg total) by mouth daily at 10 pm., Disp: 90 tablet, Rfl: 3   rifaximin  (XIFAXAN ) 200 MG tablet,  Take 2 tablets (400 mg total) by mouth 3 (three) times daily., Disp: 60 tablet, Rfl: 3   rosuvastatin (CRESTOR) 40 MG tablet, Take 40 mg by mouth daily., Disp: , Rfl:    sertraline  (ZOLOFT ) 50 MG tablet, Take 1 tablet (50 mg total) by mouth daily., Disp: 90 tablet, Rfl: 3   TRESIBA FLEXTOUCH 200 UNIT/ML FlexTouch Pen, Inject 50 Units into the skin in the morning and at bedtime., Disp: , Rfl:    triamcinolone  cream (KENALOG ) 0.1 %, Apply 1 Application topically 2 (two) times daily., Disp: 30 g, Rfl: 0  Consent:   N/A  Disposition:   1 year follow-up sooner if needed   His questions and concerns were addressed to his satisfaction. He voices understanding of the recommendations  provided during this encounter.    Signed, Madonna Michele HAS, [facility]  A Division of [facility] [address], [place], [place] [address]   07/29/2024 1:47 PM

## 2024-08-02 ENCOUNTER — Other Ambulatory Visit: Payer: Self-pay

## 2024-08-03 ENCOUNTER — Other Ambulatory Visit: Payer: Self-pay | Admitting: Acute Care

## 2024-08-03 DIAGNOSIS — Z122 Encounter for screening for malignant neoplasm of respiratory organs: Secondary | ICD-10-CM

## 2024-08-03 DIAGNOSIS — Z87891 Personal history of nicotine dependence: Secondary | ICD-10-CM

## 2024-08-04 ENCOUNTER — Telehealth: Payer: Self-pay | Admitting: *Deleted

## 2024-08-04 NOTE — Telephone Encounter (Signed)
 I called patient to ask about cpap, pt states he never started cpap nor did he get a cpap machine. Pt said she spoke with his PCP and decided he didn't want to spend money on cpap. He wanted to keep appointment scheduled on 08/08/24 to discuss neuropathy pain.

## 2024-08-04 NOTE — Progress Notes (Deleted)
 No chief complaint on file.   HISTORY OF PRESENT ILLNESS:  08/04/24 ALL:  Frederick CHRISTELLA Fulton Mickey. is a 63 y.o. male here today for follow up for peripheral neuropathy. He was last seen by Dr Chalice 01/2024 for non painful sensory changes of bilateral lower extremities. He was advised to continue lab eval with PCP and restart CPAP therapy. ALA with biotin  started.   Since,    HISTORY (copied from Dr Dohmeier's previous note)  HPI:  Frederick Hyer. is a 63 y.o. male and seen here upon referral from Dr. Balan for a Consultation/ Evaluation of Neuropathy.    [name] has a longstanding history of DM  ( 20 year history) , at times not well controlled, has hypothyroidism,  B12 deficiency- related to gastritis.  In October, we had met and I dx sleep apnea, CPAP has not been initiated yet as he switched insurances and was on [employer] COBRA- and now enrolled in [place].    Recently had B12 and TSH labs. VIT B12 is coming back up. Iron levels were low, feritin remained low after iron supplements.  He has less nocturia since being on a dex cam.    This patient reports onset of non- painful sensory symptoms beginning and advancing from the toes. over a period of 5-7 years.  This numbness has ascended to just below the knee and over the last 2-3 years started in both hands as well, the third component is sensorineural hearing loss with constant high -pitched sounds.    Last visit : Sleep consultation:  Frederick Robinson is a 63 y.o. male patient who is seen upon a cardiologist referral on 08/18/2023 to evaluate for sleep apnea,  he has hypothyroidism, DM , for many decades and recently his BP were high in AM. He has also headaches at night.   He is now on 3 antihypertensive meds and still struggles with high BP readings. Nephrologist started Farxiga and endocrinologist started ozempic .     Chief concern according to patient :  BP reading in AM higher.    I have the pleasure of seeing Frederick Robinson  08/18/23 a right-handed male with a possible sleep disorder, we are looking for a causes of HTN.     Sleep relevant medical history: DM, Thyroid  disease, HTN, B 12 deficiency, anemia.  Nocturia 1 time-2 times, Tonsillectomy at age 62,  thyroid  disease.     Family medical /sleep history: Thyroid  maternal family, cancer had kidney cancer, father liver cancer.  Brother  ( MD , a year younger than the patient ) passed form bladder cancer. G Father on CPAP with OSA,  mother had CAD, 4 vessel- bypass. Strong paternal history of Parkinson's disease, bipolar disorder.  Paternal aunt and father.    Social history:  He is busy as a father of a 52 year -old. 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  He used to travel a lot.     Tobacco use; quit at 24.   ETOH use ; moderate weekend 10 drinks- snores more after wards.GERD, snoring.  Caffeine intake in form of Coffee( 2-3 cups in AM ) Soda( /) Tea ( /) or energy drinks Exercise in form of walking.   Hobbies :golf.    Sleep habits are as follows: The patient's dinner time is between 6-7 PM.  The patient goes to bed at 10 PM and continues  to sleep for 7-8 hours, wakes for one bathroom break. The preferred sleep position is laterally, with the support of 1-2 pillows.  Dreams are reportedly frequent. The patient wakes up spontaneously at 7.50 with an alarm set for 8 AM . 8 AM is the usual rise time.He reports not feeling refreshed or restored in AM, with symptoms such as dry mouth, recently morning headaches, and residual fatigue. Naps are taken infrequently and are less refreshing than nocturnal sleep.    REVIEW OF SYSTEMS: Out of a complete 14 system review of symptoms, the patient complains only of the following symptoms, and all other reviewed systems are negative.   ALLERGIES: Allergies  Allergen Reactions   Jardiance [Empagliflozin] Hives   Pravastatin Sodium     REACTION:  HIVES     HOME MEDICATIONS: Outpatient Medications Prior to Visit  Medication Sig Dispense Refill   Alpha-Lipoic Acid 600 MG TABS Take 600 mg by mouth daily. If a formulation with BIOTIN  exists , please dispense 600 mg Alpha lipoic fatty acid and 660 mg biotin  per tab or capsule. 60 tablet 5   amLODipine  (NORVASC ) 10 MG tablet Take 1 tablet (10 mg total) by mouth daily at 10 pm. 90 tablet 3   Ascorbic Acid 500 MG CAPS as directed Orally     aspirin 81 MG tablet Take 81 mg by mouth daily.     B-D UF III MINI PEN NEEDLES 31G X 5 MM MISC USE AS DIRECTED 100 each 5   carvedilol  (COREG ) 6.25 MG tablet Take 1 tablet (6.25 mg total) by mouth 2 (two) times daily. 180 tablet 3   Cholecalciferol (VITAMIN D3) 25 MCG (1000 UT) CAPS 1 capsule.     Cobalamin Combinations (B-12) 1000-400 MCG SUBL Place 1 tablet under the tongue daily. 90 tablet 3   Continuous Glucose Sensor (DEXCOM G6 SENSOR) MISC USE AS DIRECTED. NEED TO REPLACE EVERY 10 DAYS. 9 each 3   Continuous Glucose Transmitter (DEXCOM G6 TRANSMITTER) MISC USE AS DIRECTED 1 each 0   dapagliflozin propanediol (FARXIGA) 10 MG TABS tablet Take by mouth daily.     diclofenac  Sodium (VOLTAREN ) 1 % GEL USE AS DIRECTED 50 g 2   Evolocumab  (REPATHA  SURECLICK) 140 MG/ML SOAJ Inject 140 mg into the skin every 14 (fourteen) days. 6 mL 3   fenofibrate micronized (LOFIBRA) 200 MG capsule daily before breakfast.     ferrous sulfate 325 (65 FE) MG tablet 1 tablet Orally Three times a Week     Finerenone  (KERENDIA ) 20 MG TABS Take 1 tablet (20 mg total) by mouth daily. 30 tablet 3   ibuprofen (ADVIL) 400 MG tablet as needed.     icosapent  Ethyl (VASCEPA ) 1 g capsule Take 2 capsules (2 g total) by mouth 2 (two) times daily. 360 capsule 32   isosorbide-hydrALAZINE  (BIDIL) 20-37.5 MG tablet Take 1 tablet by mouth 3 (three) times daily. 270 tablet 3   levothyroxine (SYNTHROID) 112 MCG tablet Take 224 mcg by mouth daily.     loratadine (CLARITIN) 10 MG tablet Take 10  mg by mouth daily.     losartan  (COZAAR ) 100 MG tablet Take 1 tablet (100 mg total) by mouth daily at 10 pm. 90 tablet 3   rifaximin  (XIFAXAN ) 200 MG tablet Take 2 tablets (400 mg total) by mouth 3 (three) times daily. 60 tablet 3   rosuvastatin (CRESTOR) 40 MG tablet Take 40 mg by mouth daily.     sertraline  (ZOLOFT ) 50 MG tablet Take 1 tablet (50 mg  total) by mouth daily. 90 tablet 3   TRESIBA FLEXTOUCH 200 UNIT/ML FlexTouch Pen Inject 50 Units into the skin in the morning and at bedtime.     triamcinolone  cream (KENALOG ) 0.1 % Apply 1 Application topically 2 (two) times daily. 30 g 0   No facility-administered medications prior to visit.     PAST MEDICAL HISTORY: Past Medical History:  Diagnosis Date   Arthritis 01/30/2023   B12 deficiency 07/27/2023   Coronary artery calcification    Diabetes mellitus without complication (HCC)    Gastric intestinal metaplasia without dysplasia 10/14/2023   Hx of adenomatous colonic polyps 10/14/2023   Hyperlipidemia    Hypertension    Melanoma (HCC)    Thyroid  disease      PAST SURGICAL HISTORY: Past Surgical History:  Procedure Laterality Date   COLONOSCOPY N/A 11/07/2014   Procedure: COLONOSCOPY;  Surgeon: Oneil DELENA Budge, MD;  Location: [facility];  Service: Gastroenterology;  Laterality: N/A;  830   FRACTURE SURGERY  06/11/2023   KNEE SURGERY Right    REPAIR OF PERONEUS BREVIS TENDON Left 06/11/2023   Procedure: REPAIR OF PERONEUS BREVIS TENOLYSIS;  Surgeon: Kit Rush, MD;  Location: [facility];  Service: Orthopedics;  Laterality: Left;   TENDON REPAIR Left 06/11/2023   Procedure: LEFT PERONEUS LONGUS REPAIR;  Surgeon: Kit Rush, MD;  Location: [facility];  Service: Orthopedics;  Laterality: Left;     FAMILY HISTORY: Family History  Problem Relation Age of Onset   Heart disease Mother    Cancer Mother    Kidney disease Mother    Heart disease Father    Heart attack Father    Cancer  Father        liver cancer   Diabetes Father    Obesity Father    Cancer Brother 86   Bladder Cancer Brother    Autism Son      SOCIAL HISTORY: Social History   Socioeconomic History   Marital status: Married    Spouse name: Rosaline   Number of children: 1   Years of education: Not on file   Highest education level: Bachelor's degree (e.g., BA, AB, BS)  Occupational History   Not on file  Tobacco Use   Smoking status: Former    Current packs/day: 0.00    Average packs/day: 2.0 packs/day for 29.0 years (58.0 ttl pk-yrs)    Types: Cigarettes, Cigars    Start date: 07/12/1973    Quit date: 07/12/2020    Years since quitting: 4.0    Passive exposure: Past   Smokeless tobacco: Never  Vaping Use   Vaping status: Never Used  Substance and Sexual Activity   Alcohol use: Yes    Alcohol/week: 16.0 standard drinks of alcohol    Types: 6 Glasses of wine, 10 Standard drinks or equivalent per week    Comment: weekends   Drug use: No   Sexual activity: Yes    Birth control/protection: None  Other Topics Concern   Not on file  Social History Narrative   Not on file   Social Drivers of Health   Financial Resource Strain: Low Risk  (06/21/2024)   Overall Financial Resource Strain (CARDIA)    Difficulty of Paying Living Expenses: Not very hard  Food Insecurity: No Food Insecurity (06/21/2024)   Hunger Vital Sign    Worried About Running Out of Food in the Last Year: Never true    Ran Out of Food in the Last Year: Never true  Transportation Needs: No Transportation Needs (06/21/2024)   PRAPARE - Administrator, Civil Service (Medical): No    Lack of Transportation (Non-Medical): No  Physical Activity: Insufficiently Active (06/21/2024)   Exercise Vital Sign    Days of Exercise per Week: 3 days    Minutes of Exercise per Session: 30 min  Stress: No Stress Concern Present (06/21/2024)   [employer] of Occupational Health - Occupational Stress Questionnaire    Feeling  of Stress: Only a little  Social Connections: Socially Isolated (06/21/2024)   Social Connection and Isolation Panel    Frequency of Communication with Friends and Family: Once a week    Frequency of Social Gatherings with Friends and Family: Once a week    Attends Religious Services: Never    Database administrator or Organizations: No    Attends Engineer, structural: Not on file    Marital Status: Married  Intimate Partner Violence: Unknown (06/26/2023)   Received from [facility]   HITS    Physically Hurt: Not on file    Insult or Talk Down To: Not on file    Threaten Physical Harm: Not on file    Scream or Curse: Not on file     PHYSICAL EXAM  There were no vitals filed for this visit. There is no height or weight on file to calculate BMI.  Generalized: Well developed, in no acute distress  Cardiology: normal rate and rhythm, no murmur auscultated  Respiratory: clear to auscultation bilaterally    Neurological examination  Mentation: Alert oriented to time, place, history taking. Follows all commands speech and language fluent Cranial nerve II-XII: Pupils were equal round reactive to light. Extraocular movements were full, visual field were full on confrontational test. Facial sensation and strength were normal. Uvula tongue midline. Head turning and shoulder shrug  were normal and symmetric. Motor: The motor testing reveals 5 over 5 strength of all 4 extremities. Good symmetric motor tone is noted throughout.  Sensory: Sensory testing is intact to soft touch on all 4 extremities. No evidence of extinction is noted.  Coordination: Cerebellar testing reveals good finger-nose-finger and heel-to-shin bilaterally.  Gait and station: Gait is normal. Tandem gait is normal. Romberg is negative. No drift is seen.  Reflexes: Deep tendon reflexes are symmetric and normal bilaterally.    DIAGNOSTIC DATA (LABS, IMAGING, TESTING) - I reviewed patient records, labs, notes,  testing and imaging myself where available.  Lab Results  Component Value Date   WBC 4.0 06/16/2024   HGB 13.7 06/16/2024   HCT 41.9 06/16/2024   MCV 96.7 06/16/2024   PLT 201.0 06/16/2024      Component Value Date/Time   NA 139 07/07/2024 0924   NA 145 (H) 08/17/2023 1156   K 4.5 07/07/2024 0924   CL 104 07/07/2024 0924   CO2 26 07/07/2024 0924   GLUCOSE 123 (H) 07/07/2024 0924   BUN 23 07/07/2024 0924   BUN 13 08/17/2023 1156   CREATININE 1.62 (H) 07/07/2024 0924   CALCIUM  9.8 07/07/2024 0924   PROT 7.1 06/16/2024 0924   PROT 7.2 08/17/2023 1156   ALBUMIN 4.7 07/07/2024 0924   ALBUMIN 4.5 08/17/2023 1156   AST 27 06/16/2024 0924   ALT 23 06/16/2024 0924   ALKPHOS 36 (L) 06/16/2024 0924   BILITOT 0.4 06/16/2024 0924   BILITOT 0.3 08/17/2023 1156   GFRNONAA 79 05/29/2023 0852   GFRAA 72 02/15/2018 1118   Lab Results  Component Value Date  CHOL 157 06/16/2024   HDL 61.50 06/16/2024   LDLCALC 51 06/16/2024   LDLDIRECT 73 08/17/2023   TRIG 222.0 (H) 06/16/2024   CHOLHDL 3 06/16/2024   Lab Results  Component Value Date   HGBA1C 6.6 02/15/2018   Lab Results  Component Value Date   VITAMINB12 817 06/16/2024   Lab Results  Component Value Date   TSH 14.29 (H) 06/16/2024        No data to display               No data to display           ASSESSMENT AND PLAN  63 y.o. year old male  has a past medical history of Arthritis (01/30/2023), B12 deficiency (07/27/2023), Coronary artery calcification, Diabetes mellitus without complication (HCC), Gastric intestinal metaplasia without dysplasia (10/14/2023), adenomatous colonic polyps (10/14/2023), Hyperlipidemia, Hypertension, Melanoma (HCC), and Thyroid  disease. here with    Peripheral sensory neuropathy due to type 2 diabetes mellitus (HCC)  Severe obstructive sleep apnea-hypopnea syndrome  Frederick CHRISTELLA Fulton Mickey. ***.  Healthy lifestyle habits encouraged. *** will follow up with PCP as directed. ***  will return to see me in ***, sooner if needed. *** verbalizes understanding and agreement with this plan.   No orders of the defined types were placed in this encounter.    No orders of the defined types were placed in this encounter.    Greig Forbes, MSN, FNP-C 08/04/2024, 9:07 AM  [facility] [address] [place], [place] [address] [phone]

## 2024-08-08 ENCOUNTER — Encounter: Payer: Self-pay | Admitting: Family Medicine

## 2024-08-08 ENCOUNTER — Ambulatory Visit: Admitting: Family Medicine

## 2024-08-08 DIAGNOSIS — E1142 Type 2 diabetes mellitus with diabetic polyneuropathy: Secondary | ICD-10-CM

## 2024-08-08 DIAGNOSIS — G4733 Obstructive sleep apnea (adult) (pediatric): Secondary | ICD-10-CM

## 2024-08-15 DIAGNOSIS — M25511 Pain in right shoulder: Secondary | ICD-10-CM | POA: Diagnosis not present

## 2024-08-17 NOTE — Progress Notes (Signed)
 Virtual Visit via Telephone Note  I connected with Frederick Robinson. on 08/17/24 at  9:30 AM EDT by telephone and verified that I am speaking with the correct person using two identifiers.  Location: Patient: at home Provider: [address] [place], [place], [address]    I discussed the limitations, risks, security and privacy concerns of performing an evaluation and management service by telephone and the availability of in person appointments. I also discussed with the patient that there may be a patient responsible charge related to this service. The patient expressed understanding and agreed to proceed.

## 2024-08-23 ENCOUNTER — Ambulatory Visit (INDEPENDENT_AMBULATORY_CARE_PROVIDER_SITE_OTHER): Admitting: Otolaryngology

## 2024-08-23 ENCOUNTER — Encounter (INDEPENDENT_AMBULATORY_CARE_PROVIDER_SITE_OTHER): Payer: Self-pay | Admitting: Otolaryngology

## 2024-08-23 VITALS — BP 116/64 | HR 71 | Ht 75.0 in | Wt 240.0 lb

## 2024-08-23 DIAGNOSIS — R438 Other disturbances of smell and taste: Secondary | ICD-10-CM

## 2024-08-23 DIAGNOSIS — J3089 Other allergic rhinitis: Secondary | ICD-10-CM | POA: Diagnosis not present

## 2024-08-23 DIAGNOSIS — R067 Sneezing: Secondary | ICD-10-CM | POA: Diagnosis not present

## 2024-08-23 MED ORDER — FLONASE SENSIMIST 27.5 MCG/SPRAY NA SUSP: 2.0000 | Freq: Two times a day (BID) | NASAL | 12 refills | Status: AC

## 2024-08-23 MED ORDER — AZELASTINE HCL 0.1 % NA SOLN: 2.0000 | Freq: Two times a day (BID) | NASAL | 12 refills | Status: AC | PRN

## 2024-08-23 NOTE — Progress Notes (Signed)
 Dear Dr. Jesus, Here is my assessment for our mutual patient, Frederick Robinson. Thank you for allowing me the opportunity to care for your patient. Please do not hesitate to contact me should you have any other questions. Sincerely, Dr. Eldora Blanch  Otolaryngology Clinic Note  HISTORY: Initial visit (08/2024): Discussed the use of AI scribe software for clinical note transcription with the patient, who gave verbal consent to proceed.  History of Present Illness Frederick Robinson. is a 63 year old male with type 2 diabetes who presents with severe sneezing and some rhinorrhea and loss of sense of taste and smell.  Severe sneezing has been present since the summer, but has been ongoing chronically. Getting worse this summer. The sneezing interferes with daily activities, including driving. He uses a saline solution spray twice daily and a flush every other day, which has helped reduce some severity. He takes Claritin daily. There are no history of sinus infections (never had one in my life), facial pain or pressure, discolored nasal drainage, nasal congestion, or trouble breathing through the nose. He has environmental allergies but no food allergies.  In addition, patient does report loss of sense of taste and smell has persisted for ten months following a COVID-19 infection in January. His sense of smell fluctuates and has only recently started to improve. He can taste spicy foods more than others and has not tried specific treatments for this issue. No neurologic symptoms, no diet changes, no new supplements, no history of nasal polyps. Does have B12 deficiency   AP/AC: ASA 81  Tobacco: former, quit  PMHx: CKD, Hypothyroidism, SIBO, Neuropathy, CAD, HTN, T2DM  RADIOGRAPHIC EVALUATION AND INDEPENDENT REVIEW OF OTHER RECORDS:: Dr. Jesus (07/07/2024): Allergic rhinitis -- noted sneezing all summer, h/o COVID 19, diminished sense of smell; Dx: AR, Hyposmia; Rx: ref to ENT; on saline spray  and saline rinses; using claritin B12/Folate 06/16/2024: 817/11.7 WBC 4.0, Eos 300 --- CBC (06/16/2024) ANA/RF/CRP/Sed 01/25/2024: wnl  Past Medical History:  Diagnosis Date   Arthritis 01/30/2023   B12 deficiency 07/27/2023   Coronary artery calcification    Diabetes mellitus without complication (HCC)    Gastric intestinal metaplasia without dysplasia 10/14/2023   Hx of adenomatous colonic polyps 10/14/2023   Hyperlipidemia    Hypertension    Melanoma (HCC)    Thyroid  disease    Past Surgical History:  Procedure Laterality Date   COLONOSCOPY N/A 11/07/2014   Procedure: COLONOSCOPY;  Surgeon: Oneil DELENA Budge, MD;  Location: [facility];  Service: Gastroenterology;  Laterality: N/A;  830   FRACTURE SURGERY  06/11/2023   KNEE SURGERY Right    REPAIR OF PERONEUS BREVIS TENDON Left 06/11/2023   Procedure: REPAIR OF PERONEUS BREVIS TENOLYSIS;  Surgeon: Kit Rush, MD;  Location: [facility];  Service: Orthopedics;  Laterality: Left;   TENDON REPAIR Left 06/11/2023   Procedure: LEFT PERONEUS LONGUS REPAIR;  Surgeon: Kit Rush, MD;  Location: [facility];  Service: Orthopedics;  Laterality: Left;   Family History  Problem Relation Age of Onset   Heart disease Mother    Cancer Mother    Kidney disease Mother    Heart disease Father    Heart attack Father    Cancer Father        liver cancer   Diabetes Father    Obesity Father    Cancer Brother 70   Bladder Cancer Brother    Autism Son    Social History   Tobacco Use  Smoking status: Former    Current packs/day: 0.00    Average packs/day: 2.0 packs/day for 29.0 years (58.0 ttl pk-yrs)    Types: Cigarettes, Cigars    Start date: 07/12/1973    Quit date: 07/12/2020    Years since quitting: 4.1    Passive exposure: Past   Smokeless tobacco: Never  Substance Use Topics   Alcohol use: Yes    Alcohol/week: 16.0 standard drinks of alcohol    Types: 6 Glasses of wine, 10 Standard drinks or  equivalent per week    Comment: weekends   Allergies  Allergen Reactions   Jardiance [Empagliflozin] Hives   Pravastatin Sodium     REACTION: HIVES   Current Outpatient Medications  Medication Sig Dispense Refill   Alpha-Lipoic Acid 600 MG TABS Take 600 mg by mouth daily. If a formulation with BIOTIN  exists , please dispense 600 mg Alpha lipoic fatty acid and 660 mg biotin  per tab or capsule. 60 tablet 5   amLODipine  (NORVASC ) 10 MG tablet Take 1 tablet (10 mg total) by mouth daily at 10 pm. 90 tablet 3   Ascorbic Acid 500 MG CAPS as directed Orally     aspirin 81 MG tablet Take 81 mg by mouth daily.     azelastine (ASTELIN) 0.1 % nasal spray Place 2 sprays into both nostrils 2 (two) times daily as needed for rhinitis. Use in each nostril as directed 30 mL 12   B-D UF III MINI PEN NEEDLES 31G X 5 MM MISC USE AS DIRECTED 100 each 5   carvedilol  (COREG ) 6.25 MG tablet Take 1 tablet (6.25 mg total) by mouth 2 (two) times daily. 180 tablet 3   Cholecalciferol (VITAMIN D3) 25 MCG (1000 UT) CAPS 1 capsule.     Cobalamin Combinations (B-12) 1000-400 MCG SUBL Place 1 tablet under the tongue daily. 90 tablet 3   Continuous Glucose Sensor (DEXCOM G6 SENSOR) MISC USE AS DIRECTED. NEED TO REPLACE EVERY 10 DAYS. 9 each 3   Continuous Glucose Transmitter (DEXCOM G6 TRANSMITTER) MISC USE AS DIRECTED 1 each 0   dapagliflozin propanediol (FARXIGA) 10 MG TABS tablet Take by mouth daily.     diclofenac  Sodium (VOLTAREN ) 1 % GEL USE AS DIRECTED 50 g 2   Evolocumab  (REPATHA  SURECLICK) 140 MG/ML SOAJ Inject 140 mg into the skin every 14 (fourteen) days. 6 mL 3   fenofibrate micronized (LOFIBRA) 200 MG capsule daily before breakfast.     ferrous sulfate 325 (65 FE) MG tablet 1 tablet Orally Three times a Week     Finerenone  (KERENDIA ) 20 MG TABS Take 1 tablet (20 mg total) by mouth daily. 30 tablet 3   fluticasone  (FLONASE  SENSIMIST) 27.5 MCG/SPRAY nasal spray Place 2 sprays into the nose in the morning and at  bedtime. 10 g 12   ibuprofen (ADVIL) 400 MG tablet as needed.     icosapent  Ethyl (VASCEPA ) 1 g capsule Take 2 capsules (2 g total) by mouth 2 (two) times daily. 360 capsule 32   isosorbide-hydrALAZINE  (BIDIL) 20-37.5 MG tablet Take 1 tablet by mouth 3 (three) times daily. 270 tablet 3   levothyroxine (SYNTHROID) 112 MCG tablet Take 224 mcg by mouth daily.     loratadine (CLARITIN) 10 MG tablet Take 10 mg by mouth daily.     losartan  (COZAAR ) 100 MG tablet Take 1 tablet (100 mg total) by mouth daily at 10 pm. 90 tablet 3   rosuvastatin (CRESTOR) 40 MG tablet Take 40 mg by mouth daily.  sertraline  (ZOLOFT ) 50 MG tablet Take 1 tablet (50 mg total) by mouth daily. 90 tablet 3   TRESIBA FLEXTOUCH 200 UNIT/ML FlexTouch Pen Inject 50 Units into the skin in the morning and at bedtime.     triamcinolone  cream (KENALOG ) 0.1 % Apply 1 Application topically 2 (two) times daily. 30 g 0   No current facility-administered medications for this visit.   BP 116/64 (BP Location: Left Arm, Patient Position: Sitting, Cuff Size: Large)   Pulse 71   Ht 6' 3 (1.905 m)   Wt 240 lb (108.9 kg)   SpO2 95%   BMI 30.00 kg/m   PHYSICAL EXAM:  BP 116/64 (BP Location: Left Arm, Patient Position: Sitting, Cuff Size: Large)   Pulse 71   Ht 6' 3 (1.905 m)   Wt 240 lb (108.9 kg)   SpO2 95%   BMI 30.00 kg/m    Salient findings:  CN II-XII intact Bilateral EAC clear and TM intact with well pneumatized middle ear spaces; osteomas but no other EAC findings Nose: Anterior rhinoscopy reveals septum relatively midline (left dorsal dev mild), bilateral IT without significant hypertrophy.  Nasal endoscopy was indicated to better evaluate the nose and paranasal sinuses, given the patient's history and exam findings, and is detailed below. No lesions of oral cavity/oropharynx No obviously palpable neck masses/lymphadenopathy/thyromegaly No respiratory distress or stridor   PROCEDURE:  Prior to initiating any  procedures, risks/benefits/alternatives were explained to the patient and verbal consent obtained. Diagnostic Nasal Endoscopy Pre-procedure diagnosis: Hyposmia, Rhinorhea Post-procedure diagnosis: same Indication: See pre-procedure diagnosis and physical exam above Complications: None apparent EBL: 0 mL Anesthesia: Lidocaine  4% and topical decongestant was topically sprayed in each nasal cavity  Description of Procedure:  Patient was identified. A rigid 30 degree endoscope was utilized to evaluate the sinonasal cavities, mucosa, sinus ostia and turbinates and septum.  Overall, signs of mucosal inflammation are not noted.  Also noted are no masses over visualized olfactory clefts.  No mucopurulence, polyps, or masses noted.   Right Middle meatus: clear Right SE Recess: clear Left MM: clear Left SE Recess: clear Photodocumentation was obtained.  CPT CODE -- 31231 - Mod 25   ASSESSMENT:  63 y.o. with:  1. Hyposmia   2. Other allergic rhinitis   3. Sneezing    Multifactorial problems: sneezing and some rhinorrhea likely suggestive of allergic etiology. Improved with claritin, saline spray and rinses. Endo reassuring. As such, makes most sense to start with medical management -Prescribe Flonase , two sprays each nostril or add four sprays to rinse bottle. - Prescribe Astelin nasal spray for significant sneezing, as needed. - Discontinue saline spray if desired, replace with Flonase  and Astelin. 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 Endo otherwise without any sx - Recommend smell retraining therapy using 'smell restore' kit, over the counter. - Continue rinses as above - Recommend smoke detectors and checking food expiry dates - If no improvement, consider MRI   See below regarding exact medications prescribed this encounter including dosages and  route: Meds ordered this encounter  Medications   fluticasone  (FLONASE  SENSIMIST) 27.5 MCG/SPRAY nasal spray    Sig: Place 2 sprays into the nose in the morning and at bedtime.    Dispense:  10 g    Refill:  12   azelastine (ASTELIN) 0.1 % nasal spray    Sig: Place 2 sprays into both nostrils 2 (two) times  daily as needed for rhinitis. Use in each nostril as directed    Dispense:  30 mL    Refill:  12     Thank you for allowing me the opportunity to care for your patient. Please do not hesitate to contact me should you have any other questions.  Sincerely, Eldora Blanch, MD Otolaryngologist (ENT), [facility] Phone: [phone] Fax: [phone]  MDM:  Level 4: [phone] Complexity/Problems addressed: mod - chronic problems, multiple Data complexity: mod - independent review of notes, labs - Morbidity: mod  -Drug prescribed or managed: y  08/23/2024, 12:52 PM

## 2024-08-23 NOTE — Patient Instructions (Addendum)
 Use couple of times a day -- smell retraining    Use flonsae two sprays each nostril (or add 4 sprays to the rinse bottle) when you rinse.  If you have significant sneezing, use astelin nasal spray two sprays each nostril as needed (up to twice daily)

## 2024-08-24 ENCOUNTER — Ambulatory Visit: Admitting: Internal Medicine

## 2024-09-16 ENCOUNTER — Other Ambulatory Visit: Payer: Self-pay | Admitting: Internal Medicine

## 2024-09-16 DIAGNOSIS — N179 Acute kidney failure, unspecified: Secondary | ICD-10-CM

## 2024-09-20 ENCOUNTER — Other Ambulatory Visit (HOSPITAL_COMMUNITY): Payer: Self-pay

## 2024-09-20 ENCOUNTER — Telehealth: Payer: Self-pay

## 2024-09-20 DIAGNOSIS — K638219 Small intestinal bacterial overgrowth, unspecified: Secondary | ICD-10-CM

## 2024-09-20 NOTE — Telephone Encounter (Signed)
 Pharmacy Patient Advocate Encounter   Received notification from Onbase that prior authorization for Xifaxan  550MG  tablets is required/requested.   Insurance verification completed.   The patient is insured through [facility].   Per test claim: PA required; PA submitted to above mentioned insurance via Latent Key/confirmation #/EOC [id number] Status is pending

## 2024-09-22 ENCOUNTER — Other Ambulatory Visit (HOSPITAL_COMMUNITY): Payer: Self-pay

## 2024-09-26 ENCOUNTER — Other Ambulatory Visit (HOSPITAL_COMMUNITY): Payer: Self-pay

## 2024-09-26 NOTE — Telephone Encounter (Signed)
 Pharmacy Patient Advocate Encounter  Received notification from [facility] that Prior Authorization fo Xifaxan  200MG  TABS has been DENIED.  Full denial letter will be uploaded to the media tab. See denial reason below.   PA #/Case ID/Reference #: [id number]     - Insurance pays for 9 tablets per month.

## 2024-09-28 ENCOUNTER — Other Ambulatory Visit (HOSPITAL_COMMUNITY): Payer: Self-pay

## 2024-09-29 ENCOUNTER — Other Ambulatory Visit (HOSPITAL_COMMUNITY): Payer: Self-pay

## 2024-09-29 DIAGNOSIS — D225 Melanocytic nevi of trunk: Secondary | ICD-10-CM | POA: Diagnosis not present

## 2024-09-29 DIAGNOSIS — L578 Other skin changes due to chronic exposure to nonionizing radiation: Secondary | ICD-10-CM | POA: Diagnosis not present

## 2024-09-29 DIAGNOSIS — L218 Other seborrheic dermatitis: Secondary | ICD-10-CM | POA: Diagnosis not present

## 2024-09-29 DIAGNOSIS — Z08 Encounter for follow-up examination after completed treatment for malignant neoplasm: Secondary | ICD-10-CM | POA: Diagnosis not present

## 2024-09-29 DIAGNOSIS — W908XXS Exposure to other nonionizing radiation, sequela: Secondary | ICD-10-CM | POA: Diagnosis not present

## 2024-09-29 DIAGNOSIS — L821 Other seborrheic keratosis: Secondary | ICD-10-CM | POA: Diagnosis not present

## 2024-09-29 DIAGNOSIS — L905 Scar conditions and fibrosis of skin: Secondary | ICD-10-CM | POA: Diagnosis not present

## 2024-09-29 DIAGNOSIS — Z85820 Personal history of malignant melanoma of skin: Secondary | ICD-10-CM | POA: Diagnosis not present

## 2024-09-29 DIAGNOSIS — D235 Other benign neoplasm of skin of trunk: Secondary | ICD-10-CM | POA: Diagnosis not present

## 2024-09-29 DIAGNOSIS — B948 Sequelae of other specified infectious and parasitic diseases: Secondary | ICD-10-CM | POA: Diagnosis not present

## 2024-10-10 ENCOUNTER — Encounter (HOSPITAL_COMMUNITY): Payer: Self-pay

## 2024-10-10 NOTE — Addendum Note (Signed)
 Addended by: Namrata Dangler G on: 10/10/2024 12:44 PM   Modules accepted: Orders

## 2024-10-10 NOTE — Addendum Note (Signed)
 Addended by: Aeden Matranga G on: 10/10/2024 01:41 PM   Modules accepted: Orders

## 2024-10-12 ENCOUNTER — Ambulatory Visit: Admitting: Internal Medicine

## 2024-10-12 ENCOUNTER — Encounter: Payer: Self-pay | Admitting: Internal Medicine

## 2024-10-12 VITALS — BP 126/62 | HR 70 | Temp 98.0°F | Ht 75.0 in | Wt 245.2 lb

## 2024-10-12 DIAGNOSIS — K638219 Small intestinal bacterial overgrowth, unspecified: Secondary | ICD-10-CM

## 2024-10-12 DIAGNOSIS — E039 Hypothyroidism, unspecified: Secondary | ICD-10-CM

## 2024-10-12 DIAGNOSIS — N390 Urinary tract infection, site not specified: Secondary | ICD-10-CM

## 2024-10-12 DIAGNOSIS — N182 Chronic kidney disease, stage 2 (mild): Secondary | ICD-10-CM | POA: Diagnosis not present

## 2024-10-12 DIAGNOSIS — R3915 Urgency of urination: Secondary | ICD-10-CM | POA: Diagnosis not present

## 2024-10-12 DIAGNOSIS — A09 Infectious gastroenteritis and colitis, unspecified: Secondary | ICD-10-CM | POA: Diagnosis not present

## 2024-10-12 LAB — MICROALBUMIN / CREATININE URINE RATIO
Creatinine,U: 58.1 mg/dL
Microalb Creat Ratio: 14.9 mg/g (ref 0.0–30.0)
Microalb, Ur: 0.9 mg/dL (ref 0.0–1.9)

## 2024-10-12 MED ORDER — RIFAXIMIN 550 MG PO TABS: 550.0000 mg | ORAL_TABLET | Freq: Three times a day (TID) | ORAL | 0 refills | Status: DC

## 2024-10-12 NOTE — Progress Notes (Unsigned)
 ==============================   [facility]: [phone]   -- Medical Office Visit --  Patient: Frederick Robinson.      Age: 63 y.o.       Sex:  male  Date:   10/12/2024 Today's Healthcare Provider: Bernardino KANDICE Cone, MD  ==============================   Chief Complaint: Medication Problem (Would like to discuss medication today )  Discussed the use of AI scribe software for clinical note transcription with the patient, who gave verbal consent to proceed.  History of Present Illness  63 year old male who presents with diarrhea and medication management issues.  He experiences alternating episodes of constipation and explosive diarrhea, which he attributes to small intestinal bacterial overgrowth (SIBO). He was previously treated with a regimen of 400 mg of medication daily for 10 days, which was effective. However, he is currently facing challenges with insurance coverage for his medication, which requires a positive test for SIBO that he has not undergone due to the perceived inaccuracy of the test.  He is currently taking Kerendia , initially prescribed at 10 mg, but recently increased to 20 mg. Since the dosage increase, he has noticed increased frequency of urination, including nocturia, and a decreased ability to delay urination once the urge arises. He is concerned that the increased dosage may be contributing to these symptoms.  He has a history of thyroid  issues, with recent concerns about Synthroid absorption. His T4 levels were previously noted to be zero, prompting a change in the timing of his medication intake. He is scheduled for a T4 test and comprehensive metabolic panel (CMP) on Monday to further evaluate his thyroid  function and kidney health.  He experiences bilateral flank pain, particularly in the mornings, which resolves after some activity. He reports increased physical activity and gym attendance, noting that he has been more active  recently while his son attends ABA therapy. He is not currently seeking physical therapy for his pain.  He has undergone a sleep study which indicated minimal breathing issues during sleep, and he has not pursued further evaluation for sleep apnea. He experiences morning stiffness and pain in his shoulders and hips, which he attributes to his sleeping position and increased physical activity.  Background Reviewed: Problem List: has Hypothyroidism; Diabetes mellitus, type 2 (HCC); Hyperlipidemia associated with type 2 diabetes mellitus (HCC); Resistant hypertension; Allergic rhinitis; Former smoker; Leg injury; Family history of prostate cancer; Apathetic; CKD (chronic kidney disease); Peroneal tendon rupture, left, sequela; Pain of joint of left ankle and foot; Acquired cavovarus deformity of left foot; History of melanoma; Tear of medial meniscus of knee; Tendon rupture, post-op; Snoring; Memory loss; Macrocytic anemia; Vitamin D  deficiency; Low serum parathyroid  hormone (PTH); Foot lesion; B12 deficiency anemia; Hepatic steatosis; BPH (benign prostatic hyperplasia); Vitamin B 12 deficiency; Secondary hypertension; DM (diabetes mellitus), type 2 with neurological complications (HCC); GERD without esophagitis; Pernicious anemia; Colon polyps; Gastritis; OSA (obstructive sleep apnea); Hx of adenomatous colonic polyps; Gastric intestinal metaplasia without dysplasia; Gastric atrophy; Iron deficiency; Peripheral sensory neuropathy due to type 2 diabetes mellitus (HCC); Autoimmune gastritis; Peripheral polyneuropathy; Small intestinal bacterial overgrowth (SIBO); Autoimmune polyendocrine syndrome, type 3 (HCC); Undifferentiated inflammatory arthritis; CAD (coronary artery disease); Bloating; Pain in left shoulder; Bilateral hand pain; and Dupuytren contracture of left hand on their problem list. Past Medical History:  has a past medical history of Arthritis (01/30/2023), B12 deficiency (07/27/2023), Coronary  artery calcification, Diabetes mellitus without complication (HCC), Gastric intestinal metaplasia without dysplasia (10/14/2023), adenomatous colonic polyps (10/14/2023), Hyperlipidemia,  Hypertension, Melanoma (HCC), and Thyroid  disease. Past Surgical History:   has a past surgical history that includes Knee surgery (Right); Colonoscopy (N/A, 11/07/2014); Repair of peroneus brevis tendon (Left, 06/11/2023); Tendon repair (Left, 06/11/2023); and Fracture surgery (06/11/2023). Social History:   reports that he quit smoking about 4 years ago. His smoking use included cigarettes and cigars. He started smoking about 51 years ago. He has a 58 pack-year smoking history. He has been exposed to tobacco smoke. He has never used smokeless tobacco. He reports current alcohol use of about 16.0 standard drinks of alcohol per week. He reports that he does not use drugs. Family History:  family history includes Autism in his son; Bladder Cancer in his brother; Cancer in his father and mother; Cancer (age of onset: 62) in his brother; Diabetes in his father; Heart attack in his father; Heart disease in his father and mother; Kidney disease in his mother; Obesity in his father. Allergies:  is allergic to jardiance [empagliflozin] and pravastatin sodium.   Medication Reconciliation: Current Outpatient Medications on File Prior to Visit  Medication Sig   amLODipine  (NORVASC ) 10 MG tablet Take 1 tablet (10 mg total) by mouth daily at 10 pm.   Ascorbic Acid 500 MG CAPS as directed Orally   aspirin 81 MG tablet Take 81 mg by mouth daily.   azelastine  (ASTELIN ) 0.1 % nasal spray Place 2 sprays into both nostrils 2 (two) times daily as needed for rhinitis. Use in each nostril as directed   carvedilol  (COREG ) 6.25 MG tablet Take 1 tablet (6.25 mg total) by mouth 2 (two) times daily.   Cholecalciferol (VITAMIN D3) 25 MCG (1000 UT) CAPS 1 capsule.   Cobalamin Combinations (B-12) 1000-400 MCG SUBL Place 1 tablet under the tongue  daily.   Continuous Glucose Sensor (DEXCOM G6 SENSOR) MISC USE AS DIRECTED. NEED TO REPLACE EVERY 10 DAYS.   Continuous Glucose Transmitter (DEXCOM G6 TRANSMITTER) MISC USE AS DIRECTED   dapagliflozin propanediol (FARXIGA) 10 MG TABS tablet Take by mouth daily.   diclofenac  Sodium (VOLTAREN ) 1 % GEL USE AS DIRECTED   Evolocumab  (REPATHA  SURECLICK) 140 MG/ML SOAJ Inject 140 mg into the skin every 14 (fourteen) days.   isosorbide-hydrALAZINE  (BIDIL) 20-37.5 MG tablet Take 1 tablet by mouth 3 (three) times daily.   levothyroxine (SYNTHROID) 112 MCG tablet Take 224 mcg by mouth daily.   loratadine (CLARITIN) 10 MG tablet Take 10 mg by mouth daily.   rosuvastatin (CRESTOR) 40 MG tablet Take 40 mg by mouth daily.   sertraline  (ZOLOFT ) 50 MG tablet Take 1 tablet (50 mg total) by mouth daily.   TRESIBA FLEXTOUCH 200 UNIT/ML FlexTouch Pen Inject 50 Units into the skin in the morning and at bedtime.   Alpha-Lipoic Acid 600 MG TABS Take 600 mg by mouth daily. If a formulation with BIOTIN  exists , please dispense 600 mg Alpha lipoic fatty acid and 660 mg biotin  per tab or capsule. (Patient not taking: Reported on 10/12/2024)   B-D UF III MINI PEN NEEDLES 31G X 5 MM MISC USE AS DIRECTED   fenofibrate micronized (LOFIBRA) 200 MG capsule daily before breakfast.   ferrous sulfate 325 (65 FE) MG tablet 1 tablet Orally Three times a Week   Finerenone  (KERENDIA ) 20 MG TABS Take 1 tablet (20 mg total) by mouth daily.   fluticasone  (FLONASE  SENSIMIST) 27.5 MCG/SPRAY nasal spray Place 2 sprays into the nose in the morning and at bedtime.   ibuprofen (ADVIL) 400 MG tablet as needed.   icosapent   Ethyl (VASCEPA ) 1 g capsule Take 2 capsules (2 g total) by mouth 2 (two) times daily.   losartan  (COZAAR ) 100 MG tablet Take 1 tablet (100 mg total) by mouth daily at 10 pm.   triamcinolone  cream (KENALOG ) 0.1 % APPLY TO AFFECTED AREA TWICE A DAY   No current facility-administered medications on file prior to visit.    Medications Discontinued During This Encounter  Medication Reason   rifaximin  (XIFAXAN ) 550 MG TABS tablet Expired Prescription   rifaximin  (XIFAXAN ) 200 MG tablet Expired Prescription     Physical Exam:    10/12/2024    1:42 PM 08/23/2024   11:47 AM 07/29/2024    1:24 PM  Vitals with BMI  Height 6' 3 6' 3 6' 3  Weight 245 lbs 3 oz 240 lbs 243 lbs  BMI 30.65 30 30.37  Systolic 126 116 871  Diastolic 62 64 60  Pulse 70 71 55  Vital signs reviewed.  Nursing notes reviewed. Weight trend reviewed. Physical Activity: Sufficiently Active (10/10/2024)   Exercise Vital Sign    Days of Exercise per Week: 4 days    Minutes of Exercise per Session: 60 min   General Appearance:  No acute distress appreciable.   Well-groomed, healthy-appearing male.  Well proportioned with no abnormal fat distribution.  Good muscle tone. Pulmonary:  Normal work of breathing at rest, no respiratory distress apparent. SpO2: 98 %  Musculoskeletal: All extremities are intact.  Neurological:  Awake, alert, oriented, and engaged.  No obvious focal neurological deficits or cognitive impairments.  Sensorium seems unclouded.   Speech is clear and coherent with logical content. Psychiatric:  Appropriate mood, pleasant and cooperative demeanor, thoughtful and engaged during the exam   Verbalized to patient: Physical Exam    Results:   Verbalized to patient: Results LABS   eGFR: 42   TSH: 0     01/25/2024    2:05 PM 08/20/2023    1:59 PM 06/22/2023    2:20 PM 02/15/2018   10:27 AM  PHQ 2/9 Scores  PHQ - 2 Score 0 0 1 1  PHQ- 9 Score   5       Data saved with a previous flowsheet row definition    Office Visit on 10/12/2024  Component Date Value Ref Range Status   Color, Urine 10/12/2024 YELLOW  YELLOW Final   APPearance 10/12/2024 HAZY (A)  CLEAR Final   Specific Gravity, Urine 10/12/2024 1.031  1.001 - 1.035 Final   pH 10/12/2024 5.5  5.0 - 8.0 Final   Glucose, UA 10/12/2024 3+ (A)  NEGATIVE  Final   Bilirubin Urine 10/12/2024 NEGATIVE  NEGATIVE Final   Ketones, ur 10/12/2024 NEGATIVE  NEGATIVE Final   Hgb urine dipstick 10/12/2024 NEGATIVE  NEGATIVE Final   Protein, ur 10/12/2024 NEGATIVE  NEGATIVE Final   Nitrites, Initial 10/12/2024 NEGATIVE  NEGATIVE Final   Leukocyte Esterase 10/12/2024 NEGATIVE  NEGATIVE Final   WBC, UA 10/12/2024 NONE SEEN  0 - 5 /HPF Final   RBC / HPF 10/12/2024 NONE SEEN  0 - 2 /HPF Final   Squamous Epithelial / HPF 10/12/2024 NONE SEEN  < OR = 5 /HPF Final   Bacteria, UA 10/12/2024 NONE SEEN  NONE SEEN /HPF Final   Hyaline Cast 10/12/2024 NONE SEEN  NONE SEEN /LPF Final   Note 10/12/2024    Final   Creatinine, Urine 10/12/2024 55  20 - 320 mg/dL Final   Protein/Creat Ratio 10/12/2024 NOTE  25 - 148 mg/g creat Final  Protein/Creatinine Ratio 10/12/2024 NOTE  0.025 - 0.148 mg/mg creat Final   Total Protein, Urine 10/12/2024 <4 (L)  5 - 25 mg/dL Final   Microalb, Ur 88/73/7974 0.9  0.0 - 1.9 mg/dL Final   Creatinine,U 88/73/7974 58.1  mg/dL Final   Microalb Creat Ratio 10/12/2024 14.9  0.0 - 30.0 mg/g Final   Reflexve Urine Culture 10/12/2024    Final  Office Visit on 07/20/2024  Component Date Value Ref Range Status   Sed Rate 07/20/2024 2  0 - 20 mm/h Final   C3 Complement 07/20/2024 168  82 - 185 mg/dL Final   C4 Complement 90/96/7974 31  15 - 53 mg/dL Final   Cyclic Citrullin Peptide Ab 90/96/7974 <16  UNITS Final   Total CK 07/20/2024 211  22 - 308 U/L Final  Office Visit on 07/07/2024  Component Date Value Ref Range Status   CYSTATIN C 07/07/2024 1.58 (H)  0.52 - 1.20 mg/L Final   eGFR 07/07/2024 42 (L)  >=60 mL/min/1.17m2 Final   Creatinine, Urine 07/07/2024 80  20 - 320 mg/dL Final   Protein/Creat Ratio 07/07/2024 163 (H)  25 - 148 mg/g creat Final   Protein/Creatinine Ratio 07/07/2024 0.163 (H)  0.025 - 0.148 mg/mg creat Final   Total Protein, Urine 07/07/2024 13  5 - 25 mg/dL Final   Sodium 91/78/7974 139  135 - 145 mEq/L Final    Potassium 07/07/2024 4.5  3.5 - 5.1 mEq/L Final   Chloride 07/07/2024 104  96 - 112 mEq/L Final   CO2 07/07/2024 26  19 - 32 mEq/L Final   Albumin 07/07/2024 4.7  3.5 - 5.2 g/dL Final   BUN 91/78/7974 23  6 - 23 mg/dL Final   Creatinine, Ser 07/07/2024 1.62 (H)  0.40 - 1.50 mg/dL Final   Glucose, Bld 91/78/7974 123 (H)  70 - 99 mg/dL Final   Phosphorus 91/78/7974 3.9  2.3 - 4.6 mg/dL Final   GFR 91/78/7974 44.64 (L)  >60.00 mL/min Final   Calcium  07/07/2024 9.8  8.4 - 10.5 mg/dL Final  Hospital Outpatient Visit on 07/05/2024  Component Date Value Ref Range Status   Genetic Analysis Overall Interpret* 07/05/2024 Negative   Final   Genetic Disease Assessed 07/05/2024    Final                   Value:This is a screening test and does not detect all pathogenic or likely pathogenic variant(s) in the tested genes; diagnostic testing is recommended for individuals with a personal or family history of heart disease or hereditary cancer. [facility] One  Population Screen is a screening test that analyzes 11 genes related to hereditary breast and ovarian cancer (HBOC) syndrome, Lynch syndrome, and familial hypercholesterolemia. This test only reports clinically significant pathogenic and likely  pathogenic variants but does not report variants of uncertain significance (VUS). In addition, analysis of the PMS2 gene excludes exons 11-15, which overlap with a known pseudogene (PMS2CL).    Genetic Analysis Report 07/05/2024    Final                   Value:No pathogenic or likely pathogenic variants were detected in the genes analyzed by this test.Genetic test results should be interpreted in the context of an individual's personal medical and family history. Alteration to medical management is NOT  recommended based solely on this result. Clinical correlation is advised.Additional Considerations- This is a screening test; individuals may still carry pathogenic or likely pathogenic variant(s) in  the tested  genes that are not detected by this test.-  For individuals at risk for these or other related conditions based on factors including personal or family history, diagnostic testing is recommended.- The absence of pathogenic or likely pathogenic variant(s) in the analyzed genes, while reassuring,  does not eliminate the possibility of a hereditary condition; there are other variants and genes associated with heart disease and hereditary cancer that are not included in this test.    Genes Tested 07/05/2024 See Notes   Final   Disclaimer 07/05/2024 See Notes   Final   Sequencing Location 07/05/2024 See Notes   Final   Interpretation Methods and Limitat* 07/05/2024 See Notes   Final  Lab on 06/16/2024  Component Date Value Ref Range Status   PSA 06/16/2024 0.78  0.10 - 4.00 ng/mL Final   TSH 06/16/2024 14.29 (H)  0.35 - 5.50 uIU/mL Final   Cholesterol 06/16/2024 157  0 - 200 mg/dL Final   Triglycerides 92/68/7974 222.0 (H)  0.0 - 149.0 mg/dL Final   HDL 92/68/7974 61.50  >39.00 mg/dL Final   VLDL 92/68/7974 44.4 (H)  0.0 - 40.0 mg/dL Final   LDL Cholesterol 06/16/2024 51  0 - 99 mg/dL Final   Total CHOL/HDL Ratio 06/16/2024 3   Final   NonHDL 06/16/2024 95.86   Final   Sodium 06/16/2024 143  135 - 145 mEq/L Final   Potassium 06/16/2024 4.4  3.5 - 5.1 mEq/L Final   Chloride 06/16/2024 108  96 - 112 mEq/L Final   CO2 06/16/2024 26  19 - 32 mEq/L Final   Glucose, Bld 06/16/2024 115 (H)  70 - 99 mg/dL Final   BUN 92/68/7974 28 (H)  6 - 23 mg/dL Final   Creatinine, Ser 06/16/2024 1.73 (H)  0.40 - 1.50 mg/dL Final   Total Bilirubin 06/16/2024 0.4  0.2 - 1.2 mg/dL Final   Alkaline Phosphatase 06/16/2024 36 (L)  39 - 117 U/L Final   AST 06/16/2024 27  0 - 37 U/L Final   ALT 06/16/2024 23  0 - 53 U/L Final   Total Protein 06/16/2024 7.1  6.0 - 8.3 g/dL Final   Albumin 92/68/7974 4.6  3.5 - 5.2 g/dL Final   GFR 92/68/7974 41.27 (L)  >60.00 mL/min Final   Calcium  06/16/2024 9.4  8.4 - 10.5 mg/dL  Final   WBC 92/68/7974 4.0  4.0 - 10.5 K/uL Final   RBC 06/16/2024 4.34  4.22 - 5.81 Mil/uL Final   Hemoglobin 06/16/2024 13.7  13.0 - 17.0 g/dL Final   HCT 92/68/7974 41.9  39.0 - 52.0 % Final   MCV 06/16/2024 96.7  78.0 - 100.0 fl Final   MCHC 06/16/2024 32.6  30.0 - 36.0 g/dL Final   RDW 92/68/7974 13.9  11.5 - 15.5 % Final   Platelets 06/16/2024 201.0  150.0 - 400.0 K/uL Final   Neutrophils Relative % 06/16/2024 43.0  43.0 - 77.0 % Final   Lymphocytes Relative 06/16/2024 41.3  12.0 - 46.0 % Final   Monocytes Relative 06/16/2024 8.4  3.0 - 12.0 % Final   Eosinophils Relative 06/16/2024 6.5 (H)  0.0 - 5.0 % Final   Basophils Relative 06/16/2024 0.8  0.0 - 3.0 % Final   Neutro Abs 06/16/2024 1.7  1.4 - 7.7 K/uL Final   Lymphs Abs 06/16/2024 1.7  0.7 - 4.0 K/uL Final   Monocytes Absolute 06/16/2024 0.3  0.1 - 1.0 K/uL Final   Eosinophils Absolute 06/16/2024 0.3  0.0 - 0.7 K/uL Final  Basophils Absolute 06/16/2024 0.0  0.0 - 0.1 K/uL Final   Iron 06/16/2024 91  42 - 165 ug/dL Final   Transferrin 92/68/7974 289.0  212.0 - 360.0 mg/dL Final   Saturation Ratios 06/16/2024 22.5  20.0 - 50.0 % Final   TIBC 06/16/2024 404.6  250.0 - 450.0 mcg/dL Final   Vitamin A-87 92/68/7974 817  211 - 911 pg/mL Final   Folate 06/16/2024 11.7  >5.9 ng/mL Final  Lab on 05/06/2024  Component Date Value Ref Range Status   Vitamin B-12 05/06/2024 >1500 (H)  211 - 911 pg/mL Final   Folate 05/06/2024 9.5  >5.9 ng/mL Final   TSH 05/06/2024 0.33 (L)  0.35 - 5.50 uIU/mL Final   Cholesterol 05/06/2024 147  0 - 200 mg/dL Final   Triglycerides 93/79/7974 380.0 (H)  0.0 - 149.0 mg/dL Final   HDL 93/79/7974 36.10 (L)  >60.99 mg/dL Final   VLDL 93/79/7974 76.0 (H)  0.0 - 40.0 mg/dL Final   LDL Cholesterol 05/06/2024 35  0 - 99 mg/dL Final   Total CHOL/HDL Ratio 05/06/2024 4   Final   NonHDL 05/06/2024 111.00   Final   Sodium 05/06/2024 138  135 - 145 mEq/L Final   Potassium 05/06/2024 4.4  3.5 - 5.1 mEq/L Final    Chloride 05/06/2024 105  96 - 112 mEq/L Final   CO2 05/06/2024 25  19 - 32 mEq/L Final   Glucose, Bld 05/06/2024 304 (H)  70 - 99 mg/dL Final   BUN 93/79/7974 17  6 - 23 mg/dL Final   Creatinine, Ser 05/06/2024 1.43  0.40 - 1.50 mg/dL Final   Total Bilirubin 05/06/2024 0.4  0.2 - 1.2 mg/dL Final   Alkaline Phosphatase 05/06/2024 33 (L)  39 - 117 U/L Final   AST 05/06/2024 36  0 - 37 U/L Final   ALT 05/06/2024 34  0 - 53 U/L Final   Total Protein 05/06/2024 6.6  6.0 - 8.3 g/dL Final   Albumin 93/79/7974 4.1  3.5 - 5.2 g/dL Final   GFR 93/79/7974 51.91 (L)  >60.00 mL/min Final   Calcium  05/06/2024 8.9  8.4 - 10.5 mg/dL Final   WBC 93/79/7974 3.9 (L)  4.0 - 10.5 K/uL Final   RBC 05/06/2024 4.16 (L)  4.22 - 5.81 Mil/uL Final   Hemoglobin 05/06/2024 13.1  13.0 - 17.0 g/dL Final   HCT 93/79/7974 38.6 (L)  39.0 - 52.0 % Final   MCV 05/06/2024 92.7  78.0 - 100.0 fl Final   MCHC 05/06/2024 33.9  30.0 - 36.0 g/dL Final   RDW 93/79/7974 13.3  11.5 - 15.5 % Final   Platelets 05/06/2024 189.0  150.0 - 400.0 K/uL Final   Neutrophils Relative % 05/06/2024 51.9  43.0 - 77.0 % Final   Lymphocytes Relative 05/06/2024 34.6  12.0 - 46.0 % Final   Monocytes Relative 05/06/2024 7.5  3.0 - 12.0 % Final   Eosinophils Relative 05/06/2024 5.0  0.0 - 5.0 % Final   Basophils Relative 05/06/2024 1.0  0.0 - 3.0 % Final   Neutro Abs 05/06/2024 2.0  1.4 - 7.7 K/uL Final   Lymphs Abs 05/06/2024 1.3  0.7 - 4.0 K/uL Final   Monocytes Absolute 05/06/2024 0.3  0.1 - 1.0 K/uL Final   Eosinophils Absolute 05/06/2024 0.2  0.0 - 0.7 K/uL Final   Basophils Absolute 05/06/2024 0.0  0.0 - 0.1 K/uL Final   Iron 05/06/2024 97  42 - 165 ug/dL Final   Transferrin 93/79/7974 243.0  212.0 - 360.0  mg/dL Final   Saturation Ratios 05/06/2024 28.5  20.0 - 50.0 % Final   Ferritin 05/06/2024 84.8  22.0 - 322.0 ng/mL Final   TIBC 05/06/2024 340.2  250.0 - 450.0 mcg/dL Final  Office Visit on 01/25/2024  Component Date Value Ref Range  Status   Methylmalonic Acid, Quant 01/25/2024 165  69 - 390 nmol/L Final   Sed Rate 01/25/2024 15  0 - 20 mm/hr Final   CRP 01/25/2024 <1.0  0.5 - 20.0 mg/dL Final   Anti Nuclear Antibody (ANA) 01/25/2024 Negative  Negative Final   Rheumatoid fact SerPl-aCnc 01/25/2024 <10  <14 IU/mL Final   HLA-B27 Antigen 01/25/2024 NEGATIVE  NEGATIVE Final   INTERPRETATION 01/25/2024    Final   (tTG) Ab, IgA 01/25/2024 <1.0  U/mL Final   Immunoglobulin A 01/25/2024 149  70 - 320 mg/dL Final  Lab on 96/92/7974  Component Date Value Ref Range Status   PSA 01/22/2024 0.89  0.10 - 4.00 ng/mL Final   WBC 01/22/2024 5.1  4.0 - 10.5 K/uL Final   RBC 01/22/2024 4.76  4.22 - 5.81 Mil/uL Final   Hemoglobin 01/22/2024 14.4  13.0 - 17.0 g/dL Final   HCT 96/92/7974 42.9  39.0 - 52.0 % Final   MCV 01/22/2024 90.1  78.0 - 100.0 fl Final   MCHC 01/22/2024 33.5  30.0 - 36.0 g/dL Final   RDW 96/92/7974 14.5  11.5 - 15.5 % Final   Platelets 01/22/2024 251.0  150.0 - 400.0 K/uL Final   Neutrophils Relative % 01/22/2024 53.1  43.0 - 77.0 % Final   Lymphocytes Relative 01/22/2024 33.3  12.0 - 46.0 % Final   Monocytes Relative 01/22/2024 6.7  3.0 - 12.0 % Final   Eosinophils Relative 01/22/2024 5.7 (H)  0.0 - 5.0 % Final   Basophils Relative 01/22/2024 1.2  0.0 - 3.0 % Final   Neutro Abs 01/22/2024 2.7  1.4 - 7.7 K/uL Final   Lymphs Abs 01/22/2024 1.7  0.7 - 4.0 K/uL Final   Monocytes Absolute 01/22/2024 0.3  0.1 - 1.0 K/uL Final   Eosinophils Absolute 01/22/2024 0.3  0.0 - 0.7 K/uL Final   Basophils Absolute 01/22/2024 0.1  0.0 - 0.1 K/uL Final   Vitamin B-12 01/22/2024 >1537 (H)  211 - 911 pg/mL Final   Folate 01/22/2024 10.4  >5.9 ng/mL Final   Iron 01/22/2024 84  42 - 165 ug/dL Final   Transferrin 96/92/7974 338.0  212.0 - 360.0 mg/dL Final   Saturation Ratios 01/22/2024 17.8 (L)  20.0 - 50.0 % Final   Ferritin 01/22/2024 51.7  22.0 - 322.0 ng/mL Final   TIBC 01/22/2024 473.2 (H)  250.0 - 450.0 mcg/dL Final   Office Visit on 10/21/2023  Component Date Value Ref Range Status   Gastrin 10/21/2023 40  <=100 pg/mL Final   H pylori Ag, Stl 10/22/2023 Negative  Negative Final  Procedure visit on 10/14/2023  Component Date Value Ref Range Status   SURGICAL PATHOLOGY 10/14/2023    Final-Edited                   Value:SURGICAL PATHOLOGY [facility] [address], [address] [place], [place] [address] Telephone [phone] or [phone] Fax [phone]  REPORT OF SURGICAL PATHOLOGY   Accession #: [id number] Patient Name: CHRISTOBAL, MORADO Visit # : [id number]  MRN: [id number] Physician: Avram Pitts DOB/Age Apr 28, 1961 (Age: 63) Gender: M Collected Date: 10/14/2023 Received Date: 10/19/2023  FINAL DIAGNOSIS       1. Surgical [  P], duodenal (2nd portion) :       -  DUODENAL MUCOSA WITH NO SIGNIFICANT PATHOLOGY.       2. Surgical [P], duodenal bulb :       -  DUODENAL MUCOSA WITH NO SIGNIFICANT PATHOLOGY.       3. Surgical [P], gastric antrum :       -  ANTRAL MUCOSA WITH FEATURES OF BOTH CHEMICAL/REACTIVE GASTROPATHY AND MILD TO      MODERATE CHRONIC INACTIVE GASTRITIS WITH FOCAL INTESTINAL METAPLASIA SUGGESTIVE      OF ATROPHY.      -  AN IMMUNOHISTOCHEMICAL STAIN FOR HELICOBACTER PYLORI ORGANISMS IS PENDING AND      WILL BE REPORTED IN ADDENDUM.                                4. Surgical [P], gastric body :       -  ANTALIZED OXYNTIC MUCOSA WITH EXTENSIVE INTESTINAL METAPLASIA CONSISTENT WITH      ATROPHY.      -  AN IMMUNOHISTOCHEMICAL STAIN FOR HELICOBACTER PYLORI ORGANISMS IS PENDING AND      WILL BE REPORTED IN AN ADDENDUM.       5. Surgical [P], colon, ascending, transverse, polyp (3) :       -  TUBULAR ADENOMA, FRAGMENTS       6. Surgical [P], colon, descending, polyp (3) :       -  TUBULAR ADENOMA, FRAGMENTS.      -  HYPERPLASTIC POLYP.       ELECTRONIC SIGNATURE : Legolvan Do, Mark, Pathologist, Electronic Signature  MICROSCOPIC  DESCRIPTION  CASE COMMENTS STAINS USED IN DIAGNOSIS: H&E H&E H&E Universal Negative Control-DAB H&E H&E H&E-2 H&E H&E-2 Stains used in diagnosis 3 H. Pylori IHC, 4 H. Pylori IHC Some of these immunohistochemical stains may have been developed and the performance characteristics determined by [facility].  Some may not have been cleared or approved by the U.S. Food and Drug                          Administration.  The FDA has determined that such clearance or approval is not necessary.  This test is used for clinical purposes.  It should not be regarded as investigational or for research.  This laboratory is certified under the Clinical Laboratory Improvement Amendments of 1988 (CLIA-88) as qualified to perform high complexity clinical laboratory testing.  ADDENDUM Immunohistochemical stains performed on both parts 3 and 4 are negative for Helicobacter pylori organisms. [place] Do, Mark, Pathologist, Electronic Signature ( Signed [id number])   CLINICAL HISTORY  SPECIMEN(S) OBTAINED 1. Surgical [P], Duodenal (2nd Portion) 2. Surgical [P], Duodenal Bulb 3. Surgical [P], Gastric Antrum 4. Surgical [P], Gastric Body 5. Surgical [P], Colon, Ascending, Transverse, Polyp (3) 6. Surgical [P], Colon, Descending, Polyp (3)  SPECIMEN COMMENTS: 1. Loss of weight; B12 deficiency; iron deficiency anemia, unspecified iron deficiency anemia type; benign neoplasm of as                         cending colon; benign neoplasm of transverse colon; benign neoplasm of descending colon; benign neoplasm of sigmoid colon SPECIMEN CLINICAL INFORMATION: 1. R/O celiac sprue 2. R/O celiac sprue 3. R/O H.pylori and other autoimmune gastritis 4. R/O H.pylori and other autoimmune gastritis 5. R/O adenoma 6. R/O adenoma  Gross Description 1. Received in formalin are tan, soft tissue fragments that are submitted in toto.Number: 5, Size: 0.2 cm smallest to 0.4 cm largest, (1B) (  TA ) 2. Received in formalin are tan, soft tissue fragments that are submitted in toto.Number: 1 Size: 0.3 cm, (1B) ( TA ) 3. Received in formalin are tan, soft tissue fragments that are submitted in toto.Number: 3, Size: 0.2 cm smallest to 0.5 cm largest, (1B) ( TA ) 4. Received in formalin are tan, soft tissue fragments that are submitted in toto.Number: 3, Size: 0.3 cm smallest to 0.4 cm largest, (1B) ( TA ) 5. Received in formalin are tan, soft tissue fragments that are submitted in toto.3                          Number: melanocytic proliferation, Size: 0.4 cm smallest to 1.1 cm largest, (1B) ( TA ) 6. Received in formalin are tan, soft tissue fragments that are submitted in toto.Number: 3, Size: 0.3 cm smallest to 0.6 cm largest = 4sec (1B) ( TA )        Report signed out from the following location(s) [facility] [address] [place], [place] [address] CLIA #: [id number]  [facility] [address] [place], [place] [address] CLIA #: [id number]   There may be more visits with results that are not included.  No image results found. CT CHEST LUNG CA SCREEN LOW DOSE W/O CM Result Date: 08/02/2024 CLINICAL DATA:  63 year old male with 61 pack-year history of smoking. Lung cancer screening. EXAM: CT CHEST WITHOUT CONTRAST LOW-DOSE FOR LUNG CANCER SCREENING TECHNIQUE: Multidetector CT imaging of the chest was performed following the standard protocol without IV contrast. RADIATION DOSE REDUCTION: This exam was performed according to the departmental dose-optimization program which includes automated exposure control, adjustment of the mA and/or kV according to patient size and/or use of iterative reconstruction technique. COMPARISON:  None Available. FINDINGS: Cardiovascular: The heart size is normal. No substantial pericardial effusion. Coronary artery calcification is evident. Mild atherosclerotic calcification is noted in the wall of the thoracic  aorta. Mediastinum/Nodes: No mediastinal lymphadenopathy. No evidence for gross hilar lymphadenopathy although assessment is limited by the lack of intravenous contrast on the current study. The esophagus has normal imaging features. There is no axillary lymphadenopathy. Lungs/Pleura: Centrilobular and paraseptal emphysema evident. Scattered tiny bilateral pulmonary nodules identified. No suspicious pulmonary nodule or mass. No focal airspace consolidation. No pleural effusion. Upper Abdomen: Visualized portion of the upper abdomen shows no acute findings. Musculoskeletal: No worrisome lytic or sclerotic osseous abnormality. IMPRESSION: Lung-RADS 2, benign appearance or behavior. Continue annual screening with low-dose chest CT without contrast in 12 months. Aortic Atherosclerosis (ICD10-I70.0) and Emphysema (ICD10-J43.9). Electronically Signed   By: Camellia Candle M.D.   On: 08/02/2024 12:32         ASSESSMENT & PLAN   Assessment & Plan Small intestinal bacterial overgrowth (SIBO) Diarrhea of infectious origin Small intestinal bacterial overgrowth (SIBO) with diarrhea   He experiences alternating constipation and explosive diarrhea, likely due to SIBO. Previous treatment with rifaximin  was effective, but insurance issues arose due to lack of documented diarrhea and incorrect dosing information. Current symptoms include significant gas and halitosis. He prefers rifaximin  over other antibiotics for its efficacy and fewer side effects. Prior authorization has been resubmitted with corrected information. [employer] assistance may be considered to cover medication costs. Rifaximin  550 mg twice daily for 9 days  will be prescribed if insurance approves. Potential use of other antibiotics will be discussed if rifaximin  is unavailable. Urinary urgency Urgency of urination   He has increased frequency and urgency of urination, possibly related to the increased dose of Kerendia . The differential includes UTI or  a medication side effect. Urine was checked for UTI. Kerendia  may be reduced to 10 mg if UTI is ruled out and symptoms persist. Chronic kidney disease (CKD), active medical management without dialysis, stage 2 (mild) Chronic kidney disease, stage 3a   He reports increased frequency of urination, possibly related to an increased dose of Kerendia . There is no recent proteinuria, but there are concerns about potential kidney injury due to fluctuating EGFR levels. Upcoming TSH and CMP tests will assess kidney function and thyroid  status. Urine was checked for protein and UTI. Kerendia  may be reduced to 10 mg if increased urination persists and no UTI is found. Kidney function will be monitored with the upcoming tests. Hypothyroidism, unspecified type Hypothyroidism   He has recent issues with Synthroid absorption, leading to low T4 levels. Synthroid administration has been switched to nighttime to improve absorption. A TSH test has been ordered to assess thyroid  function. General Health Maintenance   Discussed potential benefits of a memory foam mattress and pillow for improving sleep quality and reducing morning pain. Consider using a memory foam mattress and pillow.  Lab Results  Component Value Date   MICROALBUR 0.9 10/12/2024   MICROALBUR 1.9 10/29/2009   MICROALBUR 0.7 12/13/2008   MICROALBUR 1.0 09/16/2007   MICROALBUR 6.3 (H) 05/13/2007    Lab Results  Component Value Date   GFR 44.64 (L) 07/07/2024   GFR 41.27 (L) 06/16/2024   GFR 51.91 (L) 05/06/2024   GFR 58.44 (L) 10/06/2023   GFR 63.18 07/06/2023   Lab Results  Component Value Date   EGFR 42 (L) 07/07/2024   EGFR 65.0 08/18/2023   EGFR 65 08/17/2023   EGFR 59 (L) 05/28/2023   EGFR 63 10/16/2022   EGFR 87 03/20/2022   ORDER ASSOCIATIONS  #   DIAGNOSIS / CONDITION ICD-10 ENCOUNTER ORDER     ICD-10-CM   1. Small intestinal bacterial overgrowth (SIBO)  X36.1780 rifaximin  (XIFAXAN ) 550 MG TABS tablet    2. Diarrhea of  infectious origin  A09 rifaximin  (XIFAXAN ) 550 MG TABS tablet    3. Urinary urgency  R39.15 Urinalysis w microscopic + reflex cultur    Protein / creatinine ratio, urine    4. Chronic kidney disease (CKD), active medical management without dialysis, stage 2 (mild)  N18.2 Microalbumin / creatinine urine ratio    5. Hypothyroidism, unspecified type  E03.9          Orders Placed in Encounter:  Lab Orders         Urinalysis w microscopic + reflex cultur         Protein / creatinine ratio, urine         Microalbumin / creatinine urine ratio         REFLEXIVE URINE CULTURE    Meds ordered this encounter  Medications   rifaximin  (XIFAXAN ) 550 MG TABS tablet    Sig: Take 1 tablet (550 mg total) by mouth 3 (three) times daily for 9 days.    Dispense:  27 tablet    Refill:  0    New prior authorization update being submitted.   Orders Placed This Encounter  Procedures   Urinalysis w microscopic + reflex cultur   Protein / creatinine ratio, urine  Microalbumin / creatinine urine ratio    [place]   REFLEXIVE URINE CULTURE    This document was synthesized by artificial intelligence (Abridge) using HIPAA-compliant recording of the clinical interaction;   We discussed the use of AI scribe software for clinical note transcription with the patient, who gave verbal consent to proceed. additional Info: This encounter employed state-of-the-art, real-time, collaborative documentation. The patient actively reviewed and assisted in updating their electronic medical record on a shared screen, ensuring transparency and facilitating joint problem-solving for the problem list, overview, and plan. This approach promotes accurate, informed care. The treatment plan was discussed and reviewed in detail, including medication safety, potential side effects, and all patient questions. We confirmed understanding and comfort with the plan. Follow-up instructions were established, including contacting the office for any  concerns, returning if symptoms worsen, persist, or new symptoms develop, and precautions for potential emergency department visits.

## 2024-10-13 ENCOUNTER — Encounter: Payer: Self-pay | Admitting: Internal Medicine

## 2024-10-13 LAB — URINALYSIS W MICROSCOPIC + REFLEX CULTURE
Bacteria, UA: NONE SEEN /HPF
Bilirubin Urine: NEGATIVE
Hgb urine dipstick: NEGATIVE
Hyaline Cast: NONE SEEN /LPF
Ketones, ur: NEGATIVE
Leukocyte Esterase: NEGATIVE
Nitrites, Initial: NEGATIVE
Protein, ur: NEGATIVE
RBC / HPF: NONE SEEN /HPF (ref 0–2)
Specific Gravity, Urine: 1.031 (ref 1.001–1.035)
Squamous Epithelial / HPF: NONE SEEN /HPF (ref <=5)
WBC, UA: NONE SEEN /HPF (ref 0–5)
pH: 5.5 (ref 5.0–8.0)

## 2024-10-13 LAB — PROTEIN / CREATININE RATIO, URINE
Creatinine, Urine: 55 mg/dL (ref 20–320)
Total Protein, Urine: <4 mg/dL — ABNORMAL LOW (ref 5–25)

## 2024-10-13 LAB — NO CULTURE INDICATED

## 2024-10-13 NOTE — Assessment & Plan Note (Signed)
 Small intestinal bacterial overgrowth (SIBO) with diarrhea   He experiences alternating constipation and explosive diarrhea, likely due to SIBO. Previous treatment with rifaximin  was effective, but insurance issues arose due to lack of documented diarrhea and incorrect dosing information. Current symptoms include significant gas and halitosis. He prefers rifaximin  over other antibiotics for its efficacy and fewer side effects. Prior authorization has been resubmitted with corrected information. [employer] assistance may be considered to cover medication costs. Rifaximin  550 mg twice daily for 9 days will be prescribed if insurance approves. Potential use of other antibiotics will be discussed if rifaximin  is unavailable.

## 2024-10-13 NOTE — Assessment & Plan Note (Signed)
 Hypothyroidism   He has recent issues with Synthroid absorption, leading to low T4 levels. Synthroid administration has been switched to nighttime to improve absorption. A TSH test has been ordered to assess thyroid  function.

## 2024-10-13 NOTE — Patient Instructions (Signed)
 It was a pleasure seeing you today! Your health and satisfaction are our top priorities.  Frederick Cone, MD  VISIT SUMMARY: Today, we discussed your ongoing issues with diarrhea, medication management, and other health concerns. We reviewed your symptoms, current medications, and planned further tests to better understand and manage your conditions.  YOUR PLAN: -SMALL INTESTINAL BACTERIAL OVERGROWTH (SIBO) WITH DIARRHEA: SIBO is a condition where excess bacteria grow in the small intestine, causing symptoms like diarrhea and gas. We have resubmitted a prior authorization for rifaximin , which you have found effective in the past. If approved, you will take 550 mg twice daily for 9 days. If not, we will discuss alternative antibiotics.  -CHRONIC KIDNEY DISEASE, STAGE 3A: This is a condition where your kidneys are not functioning at full capacity. We are monitoring your kidney function with upcoming tests. If your increased urination persists and no UTI is found, we may reduce your Kerendia  dose back to 10 mg.  -URGENCY OF URINATION: You have been experiencing increased urgency and frequency of urination, possibly due to your increased dose of Kerendia . We checked your urine for a UTI. If no UTI is found and symptoms persist, we may reduce your Kerendia  dose.  -HYPOTHYROIDISM: This is a condition where your thyroid  does not produce enough hormones. We have changed your Synthroid administration to nighttime to improve absorption. A TSH test has been ordered to assess your thyroid  function.  -GENERAL HEALTH MAINTENANCE: We discussed the potential benefits of using a memory foam mattress and pillow to improve your sleep quality and reduce morning pain. Consider trying these to see if they help.  INSTRUCTIONS: Please follow up with the T4 test and comprehensive metabolic panel (CMP) on Monday as scheduled. If you experience any new or worsening symptoms, contact our office.  Your Providers PCP:  Robinson Frederick MATSU, MD,  [phone]) Referring Provider: Cone Frederick MATSU, MD,  [phone]) Care Team Provider: Tommas Pears, MD,  [phone]) Care Team Provider: Towana Fonda RAMAN, MD,  [phone]) Care Team Provider: Michele Richardson, [place],  [phone]) Care Team Provider: Chalice Saunas, MD,  [phone]) Care Team Provider: Ladora My [place], [place],  [phone]) Care Team Provider: Tommas Pears, MD,  [phone])  NEXT STEPS: [x]  Early Intervention: Schedule sooner appointment, call our on-call services, or go to emergency room if there is any significant Increase in pain or discomfort New or worsening symptoms Sudden or severe changes in your health [x]  Flexible Follow-Up: We recommend a No follow-ups on file. for optimal routine care. This allows for progress monitoring and treatment adjustments. [x]  Preventive Care: Schedule your annual preventive care visit! It's typically covered by insurance and helps identify potential health issues early. [x]  Lab & X-ray Appointments: Incomplete tests scheduled today, or call to schedule. X-rays: [facility] (M-F, 8:30am-noon or 1pm-5pm). [x]  Medical Information Release: Sign a release form at front desk to obtain relevant medical information we don't have.  MAKING THE MOST OF OUR FOCUSED 20 MINUTE APPOINTMENTS: [x]   Clearly state your top concerns at the beginning of the visit to focus our discussion [x]   If you anticipate you will need more time, please inform the front desk during scheduling - we can book multiple appointments in the same week. [x]   If you have transportation problems- use our convenient video appointments or ask about transportation support. [x]   We can get down to business faster if you use MyChart to update information before the visit and submit non-urgent questions before your visit. Thank you for taking  the time to provide details through MyChart.  Let our nurse know and she can import this  information into your encounter documents.  Arrival and Wait Times: [x]   Arriving on time ensures that everyone receives prompt attention. [x]   Early morning (8a) and afternoon (1p) appointments tend to have shortest wait times. [x]   Unfortunately, we cannot delay appointments for late arrivals or hold slots during phone calls.  Getting Answers and Following Up [x]   Simple Questions & Concerns: For quick questions or basic follow-up after your visit, reach us  at [phone] or MyChart messaging. [x]   Complex Concerns: If your concern is more complex, scheduling an appointment might be best. Discuss this with the staff to find the most suitable option. [x]   Lab & Imaging Results: We'll contact you directly if results are abnormal or you don't use MyChart. Most normal results will be on MyChart within 2-3 business days, with a review message from Dr. Jesus. Haven't heard back in 2 weeks? Need results sooner? Contact us  at [phone]. [x]   Referrals: Our referral coordinator will manage specialist referrals. The specialist's office should contact you within 2 weeks to schedule an appointment. Call us  if you haven't heard from them after 2 weeks.  Staying Connected [x]   MyChart: Activate your MyChart for the fastest way to access results and message us . See the last page of this paperwork for instructions on how to activate.  Bring to Your Next Appointment [x]   Medications: Please bring all your medication bottles to your next appointment to ensure we have an accurate record of your prescriptions. [x]   Health Diaries: If you're monitoring any health conditions at home, keeping a diary of your readings can be very helpful for discussions at your next appointment.  Billing [x]   X-ray & Lab Orders: These are billed by separate companies. Contact the invoicing company directly for questions or concerns. [x]   Visit Charges: Discuss any billing inquiries with our administrative services  team.  Your Satisfaction Matters [x]   Share Your Experience: We strive for your satisfaction! If you have any complaints, or preferably compliments, please let Dr. Jesus know directly or contact our Practice Administrators, Manuelita Rubin or [employer], by asking at the front desk.   Reviewing Your Records [x]   Review this early draft of your clinical encounter notes below and the final encounter summary tomorrow on MyChart after its been completed.  All orders placed so far are visible here: Small intestinal bacterial overgrowth (SIBO) -     rifAXIMin ; Take 1 tablet (550 mg total) by mouth 3 (three) times daily for 9 days.  Dispense: 27 tablet; Refill: 0  Diarrhea of infectious origin -     rifAXIMin ; Take 1 tablet (550 mg total) by mouth 3 (three) times daily for 9 days.  Dispense: 27 tablet; Refill: 0  Urinary urgency -     Urinalysis w microscopic + reflex cultur -     Protein / creatinine ratio, urine  Chronic kidney disease (CKD), active medical management without dialysis, stage 2 (mild) -     Microalbumin / creatinine urine ratio  Other orders -     REFLEXIVE URINE CULTURE

## 2024-10-15 ENCOUNTER — Ambulatory Visit: Payer: Self-pay | Admitting: Internal Medicine

## 2024-10-17 ENCOUNTER — Telehealth: Payer: Self-pay

## 2024-10-17 ENCOUNTER — Other Ambulatory Visit (HOSPITAL_COMMUNITY): Payer: Self-pay

## 2024-10-17 NOTE — Telephone Encounter (Signed)
 Pharmacy Patient Advocate Encounter   Received notification from Physician's Office that prior authorization for Xifaxan  550MG  Tabs is required/requested.   Insurance verification completed.   The patient is insured through [employer] [facility].   Per test claim: PA required and submitted KEY/EOC/Request #: [id number] from 10/17/24 to 10/31/24

## 2024-10-18 ENCOUNTER — Encounter: Payer: Self-pay | Admitting: Internal Medicine

## 2024-10-18 DIAGNOSIS — E1165 Type 2 diabetes mellitus with hyperglycemia: Secondary | ICD-10-CM | POA: Diagnosis not present

## 2024-10-18 DIAGNOSIS — E039 Hypothyroidism, unspecified: Secondary | ICD-10-CM | POA: Diagnosis not present

## 2024-10-21 ENCOUNTER — Other Ambulatory Visit (HOSPITAL_COMMUNITY): Payer: Self-pay

## 2024-10-22 ENCOUNTER — Encounter: Payer: Self-pay | Admitting: Internal Medicine

## 2024-10-22 DIAGNOSIS — K638219 Small intestinal bacterial overgrowth, unspecified: Secondary | ICD-10-CM

## 2024-10-24 MED ORDER — NEOMYCIN SULFATE 500 MG PO TABS: 500.0000 mg | ORAL_TABLET | Freq: Two times a day (BID) | ORAL | 1 refills | Status: AC

## 2024-10-28 ENCOUNTER — Other Ambulatory Visit: Payer: Self-pay | Admitting: Cardiology

## 2024-10-28 ENCOUNTER — Other Ambulatory Visit: Payer: Self-pay | Admitting: Internal Medicine

## 2024-10-28 DIAGNOSIS — N1831 Chronic kidney disease, stage 3a: Secondary | ICD-10-CM

## 2024-11-05 ENCOUNTER — Ambulatory Visit
Admission: RE | Admit: 2024-11-05 | Discharge: 2024-11-05 | Disposition: A | Discharge status: Home / Self Care | Attending: Family Medicine | Admitting: Family Medicine

## 2024-11-05 ENCOUNTER — Other Ambulatory Visit: Payer: Self-pay

## 2024-11-05 VITALS — BP 157/89 | HR 70 | Temp 97.7°F | Resp 20

## 2024-11-05 DIAGNOSIS — J069 Acute upper respiratory infection, unspecified: Secondary | ICD-10-CM | POA: Diagnosis not present

## 2024-11-05 LAB — POC COVID19/FLU A&B COMBO
Covid Antigen, POC: NEGATIVE
Influenza A Antigen, POC: NEGATIVE
Influenza B Antigen, POC: NEGATIVE

## 2024-11-05 MED ORDER — PROMETHAZINE-DM 6.25-15 MG/5ML PO SYRP: 5.0000 mL | ORAL_SOLUTION | Freq: Four times a day (QID) | ORAL | 0 refills | Status: AC | PRN

## 2024-11-05 NOTE — ED Triage Notes (Signed)
 Pt states he has had a cold that his little one brought home. Pt states he has been sick x 5 days. Pt c/o congestion, runny nose, and cough. Pt took DayQuil with a little relief.

## 2024-11-05 NOTE — ED Provider Notes (Signed)
 [facility]    CSN: [id number] Arrival date & time: 11/05/24  1355      History   Chief Complaint Chief Complaint  Patient presents with   Cough    Severe congestion - Entered by patient   Nasal Congestion    HPI Frederick Robinson. is a 63 y.o. male.    Cough Not feeling well for 5 days symptoms include rhinorrhea, nasal congestion, cough.  Developed chest congestion last p.m. felt short of breath when lying on his side.  Denies fever, chills, sweats, chest pain, wheezing, nausea, vomiting, diarrhea.  He admits to abdominal pain which is an ongoing issue, has gastritis and SIBO. 33-year-old son was sick recently however no confirmed COVID strep or flu exposures.  Denies chest pain, palpitations, dizziness or lightheadedness.  He is taking over-the-counter meds without relief, he has an empty bottle of Promethazine  DM states this generally works well for him  Past Medical History:  Diagnosis Date   Arthritis 01/30/2023   B12 deficiency 07/27/2023   Coronary artery calcification    Diabetes mellitus without complication (HCC)    Gastric intestinal metaplasia without dysplasia 10/14/2023   Hx of adenomatous colonic polyps 10/14/2023   Hyperlipidemia    Hypertension    Melanoma (HCC)    Thyroid  disease     Patient Active Problem List   Diagnosis Date Noted   Pain in left shoulder 07/20/2024   Bilateral hand pain 07/20/2024   Dupuytren contracture of left hand 07/20/2024   Bloating 04/05/2024   CAD (coronary artery disease) 03/28/2024   Undifferentiated inflammatory arthritis 01/31/2024   Peripheral sensory neuropathy due to type 2 diabetes mellitus (HCC) 01/25/2024   Autoimmune gastritis 01/25/2024   Peripheral polyneuropathy 01/25/2024   Small intestinal bacterial overgrowth (SIBO) 01/25/2024   Autoimmune polyendocrine syndrome, type 3 (HCC) 01/25/2024   Iron deficiency 01/24/2024   Gastric atrophy 11/01/2023   OSA (obstructive sleep apnea) 10/30/2023    Colon polyps 10/21/2023   Gastritis 10/21/2023   Hx of adenomatous colonic polyps 10/14/2023   Gastric intestinal metaplasia without dysplasia 10/14/2023   Pernicious anemia 08/19/2023   Secondary hypertension 08/18/2023   DM (diabetes mellitus), type 2 with neurological complications (HCC) 08/18/2023   GERD without esophagitis 08/18/2023   Hepatic steatosis 07/27/2023   BPH (benign prostatic hyperplasia) 07/27/2023   Vitamin B 12 deficiency 07/27/2023   B12 deficiency anemia 07/14/2023   Macrocytic anemia 07/09/2023   Vitamin D  deficiency 07/09/2023   Low serum parathyroid  hormone (PTH) 07/09/2023   Foot lesion 07/09/2023   Leg injury 06/22/2023   Family history of prostate cancer 06/22/2023   Apathetic 06/22/2023   CKD (chronic kidney disease) 06/22/2023   Snoring 06/22/2023   Memory loss 06/22/2023   Tendon rupture, post-op 06/12/2023   Tear of medial meniscus of knee 05/03/2023   Peroneal tendon rupture, left, sequela 04/30/2023   Pain of joint of left ankle and foot 04/30/2023   Acquired cavovarus deformity of left foot 04/30/2023   History of melanoma 01/30/2023   Former smoker 07/12/2013   Resistant hypertension 09/12/2008   Allergic rhinitis 09/23/2007   Diabetes mellitus, type 2 (HCC) 05/13/2007   Hypothyroidism 05/10/2007   Hyperlipidemia associated with type 2 diabetes mellitus (HCC) 05/10/2007    Past Surgical History:  Procedure Laterality Date   COLONOSCOPY N/A 11/07/2014   Procedure: COLONOSCOPY;  Surgeon: Oneil DELENA Budge, MD;  Location: [facility];  Service: Gastroenterology;  Laterality: N/A;  830   FRACTURE SURGERY  06/11/2023  KNEE SURGERY Right    REPAIR OF PERONEUS BREVIS TENDON Left 06/11/2023   Procedure: REPAIR OF PERONEUS BREVIS TENOLYSIS;  Surgeon: Kit Rush, MD;  Location: [facility];  Service: Orthopedics;  Laterality: Left;   TENDON REPAIR Left 06/11/2023   Procedure: LEFT PERONEUS LONGUS REPAIR;  Surgeon: Kit Rush,  MD;  Location: [facility];  Service: Orthopedics;  Laterality: Left;       Home Medications    Prior to Admission medications  Medication Sig Start Date End Date Taking? Authorizing Provider  Alpha-Lipoic Acid 600 MG TABS Take 600 mg by mouth daily. If a formulation with BIOTIN  exists , please dispense 600 mg Alpha lipoic fatty acid and 660 mg biotin  per tab or capsule. 01/27/24  Yes Dohmeier, Dedra, MD  promethazine -dextromethorphan (PROMETHAZINE -DM) 6.25-15 MG/5ML syrup Take 5 mLs by mouth 4 (four) times daily as needed for up to 6 days for cough. 11/05/24 11/11/24 Yes Tamiya Colello, PA  amLODipine  (NORVASC ) 10 MG tablet Take 1 tablet (10 mg total) by mouth daily at 10 pm. 03/02/24   Tolia, Sunit, DO  Ascorbic Acid 500 MG CAPS as directed Orally    [provider]  aspirin 81 MG tablet Take 81 mg by mouth daily.    [provider]  azelastine  (ASTELIN ) 0.1 % nasal spray Place 2 sprays into both nostrils 2 (two) times daily as needed for rhinitis. Use in each nostril as directed 08/23/24   Tobie Eldora NOVAK, MD  B-D UF III MINI PEN NEEDLES 31G X 5 MM MISC USE AS DIRECTED 06/24/23   Jesus Bernardino MATSU, MD  carvedilol  (COREG ) 6.25 MG tablet Take 1 tablet (6.25 mg total) by mouth 2 (two) times daily. 07/29/24   Tolia, Sunit, DO  Cholecalciferol (VITAMIN D3) 25 MCG (1000 UT) CAPS 1 capsule.    [provider]  Cobalamin Combinations (B-12) 1000-400 MCG SUBL Place 1 tablet under the tongue daily. 07/27/23   Jesus Bernardino MATSU, MD  Continuous Glucose Sensor (DEXCOM G6 SENSOR) MISC USE AS DIRECTED. NEED TO REPLACE EVERY 10 DAYS. 06/24/23   Jesus Bernardino MATSU, MD  Continuous Glucose Transmitter (DEXCOM G6 TRANSMITTER) MISC USE AS DIRECTED 06/24/23   Jesus Bernardino MATSU, MD  dapagliflozin propanediol (FARXIGA) 10 MG TABS tablet Take by mouth daily.    [provider]  diclofenac  Sodium (VOLTAREN ) 1 % GEL USE AS DIRECTED 06/24/23   Jesus Bernardino MATSU, MD  Evolocumab  (REPATHA   SURECLICK) 140 MG/ML SOAJ INJECT 140 MG INTO THE SKIN EVERY 14 (FOURTEEN) DAYS. 10/31/24   Tolia, Sunit, DO  fenofibrate micronized (LOFIBRA) 200 MG capsule daily before breakfast.    [provider]  ferrous sulfate 325 (65 FE) MG tablet 1 tablet Orally Three times a Week    [provider]  Finerenone  (KERENDIA ) 20 MG TABS Take 1 tablet (20 mg total) by mouth daily. 03/14/24   Jesus Bernardino MATSU, MD  fluticasone  (FLONASE  SENSIMIST) 27.5 MCG/SPRAY nasal spray Place 2 sprays into the nose in the morning and at bedtime. 08/23/24   Patel, Kunjan B, MD  ibuprofen (ADVIL) 400 MG tablet as needed.    [provider]  icosapent  Ethyl (VASCEPA ) 1 g capsule Take 2 capsules (2 g total) by mouth 2 (two) times daily. 03/01/24   Jesus Bernardino MATSU, MD  isosorbide-hydrALAZINE  (BIDIL) 20-37.5 MG tablet Take 1 tablet by mouth 3 (three) times daily. 03/02/24   Tolia, Sunit, DO  levothyroxine (SYNTHROID) 112 MCG tablet Take 224 mcg by mouth daily.  [provider]  loratadine (CLARITIN) 10 MG tablet Take 10 mg by mouth daily.    [provider]  losartan  (COZAAR ) 100 MG tablet Take 1 tablet (100 mg total) by mouth daily at 10 pm. 03/02/24 08/23/24  Tolia, Sunit, DO  neomycin  (MYCIFRADIN ) 500 MG tablet Take 1 tablet (500 mg total) by mouth 2 (two) times daily. 10/24/24   Jesus Bernardino MATSU, MD  rifaximin  (XIFAXAN ) 550 MG TABS tablet Take 1 tablet (550 mg total) by mouth 3 (three) times daily for 9 days. 10/12/24 10/21/24  Jesus Bernardino MATSU, MD  rosuvastatin (CRESTOR) 40 MG tablet Take 40 mg by mouth daily.    [provider]  sertraline  (ZOLOFT ) 50 MG tablet Take 1 tablet (50 mg total) by mouth daily. 03/01/24   Jesus Bernardino MATSU, MD  TRESIBA FLEXTOUCH 200 UNIT/ML FlexTouch Pen Inject 50 Units into the skin in the morning and at bedtime. 12/02/21   [provider]  triamcinolone  cream (KENALOG ) 0.1 % APPLY TO AFFECTED AREA TWICE A DAY 10/29/24   Jesus Bernardino MATSU, MD     Family History Family History  Problem Relation Age of Onset   Heart disease Mother    Cancer Mother    Kidney disease Mother    Heart disease Father    Heart attack Father    Cancer Father        liver cancer   Diabetes Father    Obesity Father    Cancer Brother 69   Bladder Cancer Brother    Autism Son     Social History Social History[1]   Allergies   Jardiance [empagliflozin] and Pravastatin sodium   Review of Systems Review of Systems  Respiratory:  Positive for cough.      Physical Exam Triage Vital Signs ED Triage Vitals  Encounter Vitals Group     BP 11/05/24 1408 (!) 157/89     Girls Systolic BP Percentile --      Girls Diastolic BP Percentile --      Boys Systolic BP Percentile --      Boys Diastolic BP Percentile --      Pulse Rate 11/05/24 1408 70     Resp 11/05/24 1408 20     Temp 11/05/24 1408 97.7 F (36.5 C)     Temp Source 11/05/24 1408 Oral     SpO2 11/05/24 1408 98 %     Weight --      Height --      Head Circumference --      Peak Flow --      Pain Score 11/05/24 1402 0     Pain Loc --      Pain Education --      Exclude from Growth Chart --    No data found.  Updated Vital Signs BP (!) 157/89 (BP Location: Right Arm)   Pulse 70   Temp 97.7 F (36.5 C) (Oral)   Resp 20   SpO2 98%   Visual Acuity Right Eye Distance:   Left Eye Distance:   Bilateral Distance:    Right Eye Near:   Left Eye Near:    Bilateral Near:     Physical Exam Vitals reviewed.  Constitutional:      Appearance: He is not ill-appearing.  HENT:     Head: Normocephalic and atraumatic.     Right Ear: Tympanic membrane and ear canal normal.     Left Ear: Tympanic membrane and ear canal normal.  Nose: No rhinorrhea.     Mouth/Throat:     Mouth: Mucous membranes are moist.     Pharynx: No oropharyngeal exudate or posterior oropharyngeal erythema.  Cardiovascular:     Rate and Rhythm: Normal rate and regular rhythm.     Heart sounds: Normal  heart sounds.  Pulmonary:     Effort: Pulmonary effort is normal. No respiratory distress.     Breath sounds: Normal breath sounds. No wheezing, rhonchi or rales.  Musculoskeletal:     Cervical back: Neck supple.  Lymphadenopathy:     Cervical: No cervical adenopathy.  Neurological:     Mental Status: He is alert and oriented to person, place, and time.  Psychiatric:        Mood and Affect: Mood normal.      UC Treatments / Results  Labs (all labs ordered are listed, but only abnormal results are displayed) Labs Reviewed  POC COVID19/FLU A&B COMBO    EKG   Radiology No results found.  Procedures Procedures (including critical care time)  Medications Ordered in UC Medications - No data to display  Initial Impression / Assessment and Plan / UC Course  I have reviewed the triage vital signs and the nursing notes.  Pertinent labs & imaging results that were available during my care of the patient were reviewed by me and considered in my medical decision making (see chart for details).     63 year old man with URI symptoms for several days, wants to be checked for COVID and flu as he will be traveling soon he is well-appearing, mildly hypertensive at 157/89 otherwise vital signs are stable, no evidence of bacterial infection on exam, lungs are clear to auscultation, COVID and flu are negative he is requesting Rx Promethazine  DM sent to pharmacy, home care and follow-up reviewed Final Clinical Impressions(s) / UC Diagnoses   Final diagnoses:  Upper respiratory tract infection, unspecified type   Discharge Instructions   None    ED Prescriptions     Medication Sig Dispense Auth. Provider   promethazine -dextromethorphan (PROMETHAZINE -DM) 6.25-15 MG/5ML syrup Take 5 mLs by mouth 4 (four) times daily as needed for up to 6 days for cough. 118 mL Tamir Wallman, [place]      PDMP not reviewed this encounter.    [1]  Social History Tobacco Use   Smoking status: Former     Current packs/day: 0.00    Average packs/day: 2.0 packs/day for 29.0 years (58.0 ttl pk-yrs)    Types: Cigarettes, Cigars    Start date: 07/12/1973    Quit date: 07/12/2020    Years since quitting: 4.3    Passive exposure: Past   Smokeless tobacco: Never  Vaping Use   Vaping status: Never Used  Substance Use Topics   Alcohol use: Yes    Alcohol/week: 16.0 standard drinks of alcohol    Types: 6 Glasses of wine, 10 Standard drinks or equivalent per week    Comment: weekends   Drug use: No     Engelbert Sevin, PA 11/05/24 1432  "

## 2024-12-09 ENCOUNTER — Telehealth

## 2024-12-09 ENCOUNTER — Encounter: Payer: Self-pay | Admitting: Cardiology

## 2024-12-09 ENCOUNTER — Telehealth: Admitting: Physician Assistant

## 2024-12-09 DIAGNOSIS — R062 Wheezing: Secondary | ICD-10-CM | POA: Diagnosis not present

## 2024-12-09 MED ORDER — IPRATROPIUM-ALBUTEROL 0.5-2.5 (3) MG/3ML IN SOLN: 3.0000 mL | Freq: Four times a day (QID) | RESPIRATORY_TRACT | 0 refills | Status: AC | PRN

## 2024-12-09 NOTE — Progress Notes (Signed)
" ° °  Thank you for the details you included in the comment boxes. Those details are very helpful in determining the best course of treatment for you and help us  to provide the best care.Because of ongoing symptoms despite recent evaluation, we recommend that you schedule a Virtual Urgent Care video visit in order for the provider to better assess what is going on.  The provider will be able to give you a more accurate diagnosis and treatment plan if we can more freely discuss your symptoms and with the addition of a virtual examination.   If you change your visit to a video visit, we will bill your insurance (similar to an office visit) and you will not be charged for this e-Visit. You will be able to stay at home and speak with the first available [facility] advanced practice provider. The link to do a video visit is in the drop down Menu tab of your Welcome screen in MyChart.    "

## 2024-12-09 NOTE — Progress Notes (Signed)
 E Visit for Bronchospasm/Bronchitis  Based on what you have shared with me, it looks like you may have a bronchospasm/bronchitis secondary to a viral respiratory illness.  Treatment: I have prescribed: Ipratropium-Albuterol (Duoneb) (0.5-2.5 mg in 3 mL) 0.083 % Take 3mls by nebulization solution every six hours as needed for wheezing or shortness of breath  HOME CARE Only take medications as instructed by your medical team. Consider wearing a mask or scarf to improve breathing when there is poor air quality as this has been shown to decrease irritation and decrease exacerbations Get rest. Using a humidifier may help nasal congestion and ease sore throat pain. Using a saline nasal spray works much the same way.  Cough drops, hard candies and sore throat lozenges may ease your cough.  Avoid close contacts, especially the very you and the elderly. Cover your mouth if you cough or sneeze. Always remember to wash your hands.   GET HELP RIGHT AWAY IF: You develop worsening shortness of breath/difficulty breathing or chest tightness, breathlessness at rest, drowsy, confused or agitated, unable to speak in full sentences, or if you develop chest pain.  You have coughing fits. You develop a severe headache or visual changes. You develop shortness of breath, difficulty breathing or start having chest pain. Your symptoms persist after you have completed your treatment plan. If your symptoms do not improve within 5 days.  MAKE SURE YOU Understand these instructions. Will watch your condition. Will get help right away if you are not doing well or get worse.   Your e-visit answers were reviewed by a board certified advanced clinical practitioner to complete your personal care plan, Depending upon the condition, your plan could have included both over the counter or prescription medications.  Please review your pharmacy choice. Your safety is important to us . If you have drug allergies check your  prescription carefully. You can use MyChart to ask questions about today's visit, request a non-urgent call back, or ask for a work or school excuse for 24 hours related to this e-Visit. If it has been greater than 24 hours you will need to follow up with your provider, or enter a new e-Visit to address those concerns.  You will get an e-mail in the next two days asking about your experience. I hope that your e-visit has been valuable and will speed your recovery. Thank you for using e-visits.  I have spent 5 minutes in review of e-visit questionnaire, review and updating patient chart, medical decision making and response to patient.   Delon CHRISTELLA Dickinson, PA-C

## 2024-12-22 ENCOUNTER — Ambulatory Visit: Admitting: Internal Medicine

## 2024-12-22 ENCOUNTER — Ambulatory Visit

## 2024-12-22 ENCOUNTER — Telehealth: Payer: Self-pay

## 2024-12-22 ENCOUNTER — Other Ambulatory Visit: Payer: Self-pay

## 2024-12-22 ENCOUNTER — Encounter: Payer: Self-pay | Admitting: Internal Medicine

## 2024-12-22 VITALS — BP 126/58 | HR 83 | Temp 98.2°F | Resp 14 | Ht 75.0 in | Wt 236.4 lb

## 2024-12-22 DIAGNOSIS — R453 Demoralization and apathy: Secondary | ICD-10-CM

## 2024-12-22 DIAGNOSIS — J189 Pneumonia, unspecified organism: Secondary | ICD-10-CM

## 2024-12-22 DIAGNOSIS — E1169 Type 2 diabetes mellitus with other specified complication: Secondary | ICD-10-CM

## 2024-12-22 DIAGNOSIS — J441 Chronic obstructive pulmonary disease with (acute) exacerbation: Secondary | ICD-10-CM

## 2024-12-22 DIAGNOSIS — I1A0 Resistant hypertension: Secondary | ICD-10-CM

## 2024-12-22 DIAGNOSIS — J0100 Acute maxillary sinusitis, unspecified: Secondary | ICD-10-CM

## 2024-12-22 DIAGNOSIS — D518 Other vitamin B12 deficiency anemias: Secondary | ICD-10-CM

## 2024-12-22 DIAGNOSIS — Z794 Long term (current) use of insulin: Secondary | ICD-10-CM

## 2024-12-22 LAB — CBC WITH DIFFERENTIAL/PLATELET
Basophils Absolute: 0.1 10*3/uL (ref 0.0–0.1)
Basophils Relative: 1 % (ref 0.0–3.0)
Eosinophils Absolute: 0.2 10*3/uL (ref 0.0–0.7)
Eosinophils Relative: 2.5 % (ref 0.0–5.0)
HCT: 44.9 % (ref 39.0–52.0)
Hemoglobin: 15.7 g/dL (ref 13.0–17.0)
Lymphocytes Relative: 23.6 % (ref 12.0–46.0)
Lymphs Abs: 1.8 10*3/uL (ref 0.7–4.0)
MCHC: 35 g/dL (ref 30.0–36.0)
MCV: 92.2 fl (ref 78.0–100.0)
Monocytes Absolute: 0.5 10*3/uL (ref 0.1–1.0)
Monocytes Relative: 7 % (ref 3.0–12.0)
Neutro Abs: 4.9 10*3/uL (ref 1.4–7.7)
Neutrophils Relative %: 65.9 % (ref 43.0–77.0)
Platelets: 258 10*3/uL (ref 150.0–400.0)
RBC: 4.87 Mil/uL (ref 4.22–5.81)
RDW: 12.1 % (ref 11.5–15.5)
WBC: 7.4 10*3/uL (ref 4.0–10.5)

## 2024-12-22 LAB — COMPREHENSIVE METABOLIC PANEL WITH GFR
ALT: 21 U/L (ref 3–53)
AST: 19 U/L (ref 5–37)
Albumin: 4.5 g/dL (ref 3.5–5.2)
Alkaline Phosphatase: 52 U/L (ref 39–117)
BUN: 23 mg/dL (ref 6–23)
CO2: 28 meq/L (ref 19–32)
Calcium: 10 mg/dL (ref 8.4–10.5)
Chloride: 97 meq/L (ref 96–112)
Creatinine, Ser: 1.83 mg/dL — ABNORMAL HIGH (ref 0.40–1.50)
GFR: 38.44 mL/min — ABNORMAL LOW
Glucose, Bld: 447 mg/dL — ABNORMAL HIGH (ref 70–99)
Potassium: 4.9 meq/L (ref 3.5–5.1)
Sodium: 133 meq/L — ABNORMAL LOW (ref 135–145)
Total Bilirubin: 0.5 mg/dL (ref 0.2–1.2)
Total Protein: 7.3 g/dL (ref 6.0–8.3)

## 2024-12-22 LAB — LDL CHOLESTEROL, DIRECT: Direct LDL: 98 mg/dL

## 2024-12-22 LAB — MICROALBUMIN / CREATININE URINE RATIO
Creatinine,U: 51.4 mg/dL
Microalb Creat Ratio: 55 mg/g — ABNORMAL HIGH (ref 0.0–30.0)
Microalb, Ur: 2.8 mg/dL — ABNORMAL HIGH (ref 0.7–1.9)

## 2024-12-22 LAB — HEMOGLOBIN A1C: Hgb A1c MFr Bld: 12.2 % — ABNORMAL HIGH (ref 4.6–6.5)

## 2024-12-22 MED ORDER — AZITHROMYCIN 250 MG PO TABS: ORAL_TABLET | ORAL | 0 refills | Status: DC

## 2024-12-22 MED ORDER — SERTRALINE HCL 50 MG PO TABS: 50.0000 mg | ORAL_TABLET | Freq: Every day | ORAL | 3 refills | Status: AC

## 2024-12-22 MED ORDER — BD PEN NEEDLE MINI U/F 31G X 5 MM MISC: 1 refills | Status: AC

## 2024-12-22 MED ORDER — B-12 1000-400 MCG SL SUBL: 1.0000 | SUBLINGUAL_TABLET | Freq: Every day | SUBLINGUAL | 3 refills | Status: AC

## 2024-12-22 MED ORDER — DEXCOM G6 SENSOR MISC: 3 refills | Status: AC

## 2024-12-22 MED ORDER — IPRATROPIUM-ALBUTEROL 0.5-2.5 (3) MG/3ML IN SOLN: 3.0000 mL | RESPIRATORY_TRACT | 2 refills | Status: AC | PRN

## 2024-12-22 MED ORDER — IPRATROPIUM-ALBUTEROL 0.5-2.5 (3) MG/3ML IN SOLN: 3.0000 mL | RESPIRATORY_TRACT | 2 refills | Status: DC | PRN

## 2024-12-22 MED ORDER — PREDNISONE 20 MG PO TABS: ORAL_TABLET | ORAL | 0 refills | Status: DC

## 2024-12-22 MED ORDER — PREDNISONE 20 MG PO TABS: ORAL_TABLET | ORAL | 0 refills | Status: AC

## 2024-12-22 MED ORDER — AZITHROMYCIN 250 MG PO TABS: ORAL_TABLET | ORAL | 0 refills | Status: AC

## 2024-12-22 MED ORDER — DICLOFENAC SODIUM 1 % EX GEL: CUTANEOUS | 0 refills | Status: AC

## 2024-12-22 MED ORDER — KERENDIA 20 MG PO TABS: 20.0000 mg | ORAL_TABLET | Freq: Every day | ORAL | 3 refills | Status: AC

## 2024-12-22 MED ORDER — ICOSAPENT ETHYL 1 G PO CAPS: 2.0000 g | ORAL_CAPSULE | Freq: Two times a day (BID) | ORAL | 3 refills | Status: AC

## 2024-12-22 MED ORDER — DEXCOM G6 TRANSMITTER MISC: 3 refills | Status: AC

## 2024-12-22 NOTE — Assessment & Plan Note (Signed)
 Will prescribe refills per patient request

## 2024-12-22 NOTE — Patient Instructions (Addendum)
 When to Return to the Emergency Room: Pneumonia Warning Signs Take probiotics when done with Z-pack Look into taking zinc and NAD+ supplements   When to Go to the Emergency Room  If you have been diagnosed with pneumonia, you should return to the emergency room immediately if you experience any of these warning signs:  Breathing Problems:  - Severe shortness of breath or difficulty breathing  - Breathing faster than 30 breaths per minute  - Feeling like you cannot catch your breath  - Chest pain that gets worse when you breathe or cough  - Bluish color to your lips or fingernails  Changes in Mental Status:  - New confusion or disorientation  - Difficulty staying awake or extreme drowsiness  - Trouble recognizing people or places  Signs of Severe Infection:  - High fever (over 103F or 39.4C) that does not improve with medication  - Very low body temperature (below 73F or 35C)  - Shaking chills  - Dizziness or feeling faint when standing up  - Blood pressure that drops (feeling lightheaded, weak, or like you might pass out)  Other Serious Symptoms:  - Coughing up blood  - Severe chest pain  - Rapid heartbeat that does not slow down  - Inability to keep down fluids or medications  - Symptoms that are getting worse instead of better after 2-3 days of treatment What to Expect During Recovery   Most people start feeling better within 3-5 days of starting antibiotics, but complete recovery takes longer. You may still feel tired and have a cough for several weeks. This is normal. Important Reminders   - Take all of your antibiotics exactly as prescribed, even if you start feeling better  - Rest and drink plenty of fluids  - Follow up with your regular doctor within 1-2 weeks  - If you smoke, this is an excellent time to quit--ask your doctor for help  When in doubt, seek medical attention. It is better to be checked and reassured than to wait too  long.   ALLERGY MANAGEMENT PLAN  This plan is designed to help manage your allergic rhinitis (nasal allergies) effectively. Follow these steps daily for best results.  Sinus saline sprays- use nightly, and after sneezing episodes or exposure to allergen.  Insert deeply and spray mist into nose while leaning over sink at 45 degrees,  while gently breathing. Also blow out onto tissue while leaning forward 45 degrees. Once daily, after a sinus rinse, use sensimist.  Just before bedtime is best. This only needed if allergies acting up.  If this is inadequate add-on once daily for levocetirizine / xyzal 5 mg for nondrowsy antihistamine Take benadryl 25 mg at bedtime also if allergic mucus is persisting  When allergies cause chronic swelling in sinuses, it leads to sinus infections:    DAILY TREATMENT ROUTINE   Time of Day Treatment Steps  Morning 1. Saline Nasal Spray - Use to cleanse nasal passages 2. Xyzal (levocetirizine) - Take one tablet daily   Throughout Day Saline Nasal Spray - Use 2 additional times (mid-day and afternoon)   Evening/Bedtime 1. Saline Nasal Rinse - Thoroughly clean nasal passages 2. Flonase  Sensimist - Apply after nasal rinse 3. Benadryl (diphenhydramine) - Take 25mg  if experiencing persistent congestion    PROPER TECHNIQUE GUIDE       Saline Nasal Spray/Rinse Technique: Lean forward over sink at a 45-degree angle Turn head slightly to one side Insert spray tip into upper nostril Spray gently while breathing  lightly through your nose Repeat on other side Gently blow nose to clear excess solution Use saline spray 3 times daily to keep nasal passages moist and clear allergens.       Flonase  Sensimist Technique: Shake bottle gently before each use Prime the bottle if it's new or hasn't been used for a week Tilt your head forward slightly Insert tip into nostril, pointing away from the center of your nose Spray while inhaling gently Repeat in other  nostril Use Flonase  Sensimist once daily, preferably at bedtime after using saline rinse. It may take several days of regular use to feel maximum benefit.   WHY FLONASE  SENSIMIST?   Benefits of Flonase  Sensimist:  Alcohol-free and scent-free formula - gentler on sensitive nasal passages Fine mist application - more comfortable with less dripping down throat Effectively relieves nasal congestion, sneezing, runny nose, and even eye symptoms 24-hour relief with once-daily dosing Uses a more potent form of fluticasone  that works at a lower dose Less liquid per spray means less discomfort  UNDERSTANDING YOUR MEDICATIONS   Medication How It Works Important Notes  Flonase  Sensimist (fluticasone  furoate) Reduces inflammation in nasal passages, addressing the underlying cause of allergy symptoms - Takes several days for full effect - Use daily for best results - Safe for long-term use   Xyzal (levocetirizine) Blocks histamine to reduce allergy symptoms like sneezing and itching - Take at the same time each day - May cause drowsiness in some people - Once-daily dosing   Benadryl (diphenhydramine) Antihistamine that provides additional relief for breakthrough symptoms - Causes drowsiness - Use only at bedtime - For occasional use when needed   Saline Spray/Rinse Physically removes allergens and moistens nasal passages - Safe to use frequently - Improves effectiveness of other treatments - Reduces nasal irritation    CONTACT YOUR PROVIDER IF: Your symptoms do not improve after 1-2 weeks of following this plan You develop sinus pain with fever or green/yellow discharge You experience frequent nosebleeds You develop new or worsening symptoms You have questions about your treatment plan     ADDITIONAL ALLERGY MANAGEMENT TIPS   HELPFUL STRATEGIES: ?? Keep windows closed during high pollen seasons ??? Use allergen-proof covers for pillows and mattresses ?? Vacuum regularly with a HEPA  filter vacuum ?? Shower and change clothes after spending time outdoors ?? Check local pollen counts and limit outdoor time when counts are high ?? Stay well-hydrated to help keep mucous membranes moist     It was a pleasure seeing you today! Your health and satisfaction are our top priorities.  Frederick Cone, MD  VISIT SUMMARY: During your visit, we discussed your persistent respiratory symptoms, recent treatment for small intestinal bacterial overgrowth, and other health concerns. We reviewed your symptoms, including wheezing, fatigue, dry eyes, headaches, and joint pain. We also discussed your elevated blood sugars and blood pressure management.  YOUR PLAN: -PNEUMONIA: Pneumonia is an infection that inflames the air sacs in one or both lungs. You have been prescribed azithromycin  (Z-Pak) for suspected bacterial pneumonia and prednisone  for potential air hunger. Continue using DuoNeb up to four times daily for symptom relief. A chest x-ray has been ordered to confirm the diagnosis. If your symptoms do not improve, you have been referred to a pulmonologist. Please monitor your oxygen saturation and seek care if your symptoms worsen.  -ACUTE SINUSITIS: Acute sinusitis is an infection or inflammation of the sinuses. To help alleviate your symptoms of dry eyes and frontal headaches, I recommend using a sinus rinse  at bedtime.  -TYPE 2 DIABETES MELLITUS WITH STAGE 3A CHRONIC KIDNEY DISEASE: Type 2 diabetes is a condition that affects the way your body processes blood sugar, and stage 3a chronic kidney disease indicates moderate kidney damage. Your blood sugars have been elevated, likely due to recent prednisone  use. We will continue to manage your diabetes with insulin adjustments as needed. I have ordered full diabetes blood work to assess your current status.  -RESISTANT HYPERTENSION: Resistant hypertension is high blood pressure that remains above goal despite the use of multiple medications. Your  blood pressure is well-controlled with your current medication regimen, so please continue taking your antihypertensive medications as prescribed.  -GENERAL HEALTH MAINTENANCE: We discussed the use of probiotics after your antibiotic treatment to help restore gut flora. I also recommended considering NAD and zinc supplements for additional health benefits.  INSTRUCTIONS: Please follow up with the chest x-ray to confirm the pneumonia diagnosis. Continue monitoring your blood sugars and blood pressure, and adjust your insulin as needed. If your respiratory symptoms do not improve, please see the pulmonologist. Use the sinus rinse at bedtime to help with your sinus symptoms. Complete the full diabetes blood work as ordered. If you experience any worsening of symptoms, seek medical attention immediately.  Your Providers PCP: Jesus Frederick MATSU, MD,  [phone]) Referring Provider: Jesus Frederick MATSU, MD,  [phone]) Care Team Provider: Tommas Pears, MD,  [phone]) Care Team Provider: Towana Fonda RAMAN, MD,  [phone]) Care Team Provider: Michele Richardson, [place],  [phone]) Care Team Provider: Chalice Saunas, MD,  [phone]) Care Team Provider: Ladora My Mora, [place],  [phone]) Care Team Provider: Tommas Pears, MD,  [phone])  NEXT STEPS: [x]  Early Intervention: Schedule sooner appointment, call our on-call services, or go to emergency room if there is any significant Increase in pain or discomfort New or worsening symptoms Sudden or severe changes in your health [x]  Flexible Follow-Up: We recommend a No follow-ups on file. for optimal routine care. This allows for progress monitoring and treatment adjustments. [x]  Preventive Care: Schedule your annual preventive care visit! It's typically covered by insurance and helps identify potential health issues early. [x]  Lab & X-ray Appointments: Incomplete tests scheduled today, or call to schedule. X-rays: [facility]  at [place] (M-F, 8:30am-noon or 1pm-5pm). [x]  Medical Information Release: Sign a release form at front desk to obtain relevant medical information we don't have.  MAKING THE MOST OF OUR FOCUSED 20 MINUTE APPOINTMENTS: [x]   Clearly state your top concerns at the beginning of the visit to focus our discussion [x]   If you anticipate you will need more time, please inform the front desk during scheduling - we can book multiple appointments in the same week. [x]   If you have transportation problems- use our convenient video appointments or ask about transportation support. [x]   We can get down to business faster if you use MyChart to update information before the visit and submit non-urgent questions before your visit. Thank you for taking the time to provide details through MyChart.  Let our nurse know and she can import this information into your encounter documents.  Arrival and Wait Times: [x]   Arriving on time ensures that everyone receives prompt attention. [x]   Early morning (8a) and afternoon (1p) appointments tend to have shortest wait times. [x]   Unfortunately, we cannot delay appointments for late arrivals or hold slots during phone calls.  Getting Answers and Following Up [x]   Simple Questions & Concerns: For quick questions or basic follow-up after your visit, reach us   at [phone] or MyChart messaging. [x]   Complex Concerns: If your concern is more complex, scheduling an appointment might be best. Discuss this with the staff to find the most suitable option. [x]   Lab & Imaging Results: We'll contact you directly if results are abnormal or you don't use MyChart. Most normal results will be on MyChart within 2-3 business days, with a review message from Dr. Jesus. Haven't heard back in 2 weeks? Need results sooner? Contact us  at [phone]. [x]   Referrals: Our referral coordinator will manage specialist referrals. The specialist's office should contact you within 2 weeks to  schedule an appointment. Call us  if you haven't heard from them after 2 weeks.  Staying Connected [x]   MyChart: Activate your MyChart for the fastest way to access results and message us . See the last page of this paperwork for instructions on how to activate.  Bring to Your Next Appointment [x]   Medications: Please bring all your medication bottles to your next appointment to ensure we have an accurate record of your prescriptions. [x]   Health Diaries: If you're monitoring any health conditions at home, keeping a diary of your readings can be very helpful for discussions at your next appointment.  Billing [x]   X-ray & Lab Orders: These are billed by separate companies. Contact the invoicing company directly for questions or concerns. [x]   Visit Charges: Discuss any billing inquiries with our administrative services team.  Your Satisfaction Matters [x]   Share Your Experience: We strive for your satisfaction! If you have any complaints, or preferably compliments, please let Dr. Jesus know directly or contact our Practice Administrators, Manuelita Rubin or [employer], by asking at the front desk.   Reviewing Your Records [x]   Review this early draft of your clinical encounter notes below and the final encounter summary tomorrow on MyChart after its been completed.  All orders placed so far are visible here: Pneumonia due to infectious organism, unspecified laterality, unspecified part of lung -     Pulmonary Visit  COPD exacerbation (HCC) -     DG Chest 2 View; Future -     Pulmonary Visit  Apathetic -     Sertraline  HCl; Take 1 tablet (50 mg total) by mouth daily.  Dispense: 90 tablet; Refill: 3  Hyperlipidemia associated with type 2 diabetes mellitus (HCC) -     Icosapent  Ethyl; Take 2 capsules (2 g total) by mouth 2 (two) times daily.  Dispense: 180 capsule; Refill: 3  Type 2 diabetes mellitus with stage 3a chronic kidney disease, with long-term current use of insulin (HCC) -      Kerendia ; Take 1 tablet (20 mg total) by mouth daily.  Dispense: 90 tablet; Refill: 3 -     Diclofenac  Sodium; USE AS DIRECTED  Dispense: 350 g; Refill: 0 -     Dexcom G6 Transmitter; USE AS DIRECTED  Dispense: 3 each; Refill: 3 -     Dexcom G6 Sensor; USE AS DIRECTED. NEED TO REPLACE EVERY 10 DAYS.  Dispense: 9 each; Refill: 3 -     BD Pen Needle Mini U/F; USE AS DIRECTED  Dispense: 300 each; Refill: 1 -     CBC with Differential/Platelet -     Comprehensive metabolic panel with GFR -     Hemoglobin A1c -     Microalbumin / creatinine urine ratio -     LDL cholesterol, direct  Other vitamin B12 deficiency anemia -     B-12; Place 1 tablet under the tongue daily.  Dispense: 90 tablet; Refill: 3  Acute maxillary sinusitis, recurrence not specified  Resistant hypertension

## 2024-12-22 NOTE — Assessment & Plan Note (Signed)
 Blood pressure well-controlled with current medication regimen.  - Continue current antihypertensive medications.

## 2024-12-22 NOTE — Telephone Encounter (Signed)
 I reached out to patient to let him know Prescriptions have been re-sent to the correct pharmacy, patient verbalized understanding.

## 2024-12-22 NOTE — Assessment & Plan Note (Signed)
 Blood sugars elevated, likely due to recent prednisone  use. Current management includes insulin adjustments. No recent A1c available, but previous levels were well-controlled. Ordered full diabetes blood work to assess current status. Continue current insulin regimen with adjustments as needed.

## 2024-12-22 NOTE — Telephone Encounter (Signed)
 Copied from CRM [id number]. Topic: Clinical - Prescription Issue >> Dec 22, 2024  2:48 PM Montie POUR wrote: Reason for CRM:  Mr Sookram is calling to let us  know his 3 medications went to the incorrect pharmacy. They need to [facility] to [facility] in [place]. These are the medications azithromycin  (ZITHROMAX ) 250 MG tablet predniSONE  (DELTASONE ) 20 MG tablet  ipratropium-albuterol  (DUONEB) 0.5-2.5 (3) MG/3ML SOLN   Everything else can goes to mail order pharmacy. Please call Mr. Glazebrook with questions at [phone]

## 2024-12-22 NOTE — Progress Notes (Signed)
 ==============================  [facility]: [phone]   -- Medical Office Visit --  Patient: Frederick Robinson.      Age: 64 y.o.       Sex:  male  Date:   12/22/2024 Today's Healthcare Provider: Bernardino KANDICE Cone, MD  ==============================   Chief Complaint: Cough (Says his son came home sick a couple of days ago. All test came back negative at last visit. Says he is feeling worse and his wife is currently sick. Wants to check that it's not pneumonia. )  Also due for bloodwork and needs medication(s) refills and chronic disease monitoring    Discussed the use of AI scribe software for clinical note transcription with the patient, who gave verbal consent to proceed. History of Present Illness 64 year old male who presents with persistent respiratory symptoms.  He recently completed treatment for small intestinal bacterial overgrowth (SIBO) with rifaximin  and neomycin , along with partially hydrolyzed guar gum (PHGG). He experienced significant diarrhea during the first three days of treatment, which resolved by the seventh day.  His respiratory symptoms began around December 09, 2024, initially presenting as upper respiratory symptoms with clear sputum and wheezing. He was treated with DuoNeb and a five-day course of steroids, which provided temporary relief. However, his symptoms have worsened, and he now experiences wheezing and fatigue, particularly after exertion. He uses DuoNeb at home once daily and notes elevated blood sugars since taking prednisone . He has a history of sleep apnea and no smoking history.  His four-year-old son, who is in ABA therapy for autism, recently had a respiratory illness treated with antibiotics. His wife also contracted the illness and has been symptomatic for four weeks.  He reports additional symptoms of dry eyes and low-grade headaches in the morning, which resolve with activity. He also experiences joint  pain that improves with movement. No fever is noted, and he has been monitoring his blood pressure and blood sugars, noting that his sugars have been slightly elevated.  He is currently on Medicaid and will transition to Medicare in two months.  Background Reviewed: Problem List: has Hypothyroidism; Diabetes mellitus, type 2 (HCC); Hyperlipidemia associated with type 2 diabetes mellitus (HCC); Resistant hypertension; Allergic rhinitis; Former smoker; Leg injury; Family history of prostate cancer; Apathetic; CKD (chronic kidney disease); Peroneal tendon rupture, left, sequela; Pain of joint of left ankle and foot; Acquired cavovarus deformity of left foot; History of melanoma; Tear of medial meniscus of knee; Tendon rupture, post-op; Snoring; Memory loss; Macrocytic anemia; Vitamin D  deficiency; Low serum parathyroid  hormone (PTH); Foot lesion; B12 deficiency anemia; Hepatic steatosis; BPH (benign prostatic hyperplasia); Vitamin B 12 deficiency; Secondary hypertension; DM (diabetes mellitus), type 2 with neurological complications (HCC); GERD without esophagitis; Pernicious anemia; Colon polyps; Gastritis; OSA (obstructive sleep apnea); Hx of adenomatous colonic polyps; Gastric intestinal metaplasia without dysplasia; Gastric atrophy; Iron deficiency; Peripheral sensory neuropathy due to type 2 diabetes mellitus (HCC); Autoimmune gastritis; Peripheral polyneuropathy; Small intestinal bacterial overgrowth (SIBO); Autoimmune polyendocrine syndrome, type 3 (HCC); Undifferentiated inflammatory arthritis; CAD (coronary artery disease); Bloating; Pain in left shoulder; Bilateral hand pain; and Dupuytren contracture of left hand on their problem list. Past Medical History:  has a past medical history of Arthritis (01/30/2023), B12 deficiency (07/27/2023), Coronary artery calcification, Diabetes mellitus without complication (HCC), Gastric intestinal metaplasia without dysplasia (10/14/2023), adenomatous colonic polyps  (10/14/2023), Hyperlipidemia, Hypertension, Melanoma (HCC), and Thyroid  disease. Past Surgical History:   has a past surgical history that includes Knee surgery (Right);  Colonoscopy (N/A, 11/07/2014); Repair of peroneus brevis tendon (Left, 06/11/2023); Tendon repair (Left, 06/11/2023); and Fracture surgery (06/11/2023). Social History:   reports that he quit smoking about 4 years ago. His smoking use included cigarettes and cigars. He started smoking about 51 years ago. He has a 58 pack-year smoking history. He has been exposed to tobacco smoke. He has never used smokeless tobacco. He reports current alcohol use of about 16.0 standard drinks of alcohol per week. He reports that he does not use drugs. Family History:  family history includes Autism in his son; Bladder Cancer in his brother; Cancer in his father and mother; Cancer (age of onset: 62) in his brother; Diabetes in his father; Heart attack in his father; Heart disease in his father and mother; Kidney disease in his mother; Obesity in his father. Allergies:  is allergic to jardiance [empagliflozin] and pravastatin sodium.   Medication Reconciliation: Current Outpatient Medications on File Prior to Visit  Medication Sig   Alpha-Lipoic Acid 600 MG TABS Take 600 mg by mouth daily. If a formulation with BIOTIN  exists , please dispense 600 mg Alpha lipoic fatty acid and 660 mg biotin  per tab or capsule.   amLODipine  (NORVASC ) 10 MG tablet Take 1 tablet (10 mg total) by mouth daily at 10 pm.   Ascorbic Acid 500 MG CAPS as directed Orally   aspirin 81 MG tablet Take 81 mg by mouth daily.   azelastine  (ASTELIN ) 0.1 % nasal spray Place 2 sprays into both nostrils 2 (two) times daily as needed for rhinitis. Use in each nostril as directed   carvedilol  (COREG ) 6.25 MG tablet Take 1 tablet (6.25 mg total) by mouth 2 (two) times daily.   Cholecalciferol (VITAMIN D3) 25 MCG (1000 UT) CAPS 1 capsule.   dapagliflozin propanediol (FARXIGA) 10 MG TABS tablet  Take by mouth daily.   Evolocumab  (REPATHA  SURECLICK) 140 MG/ML SOAJ INJECT 140 MG INTO THE SKIN EVERY 14 (FOURTEEN) DAYS.   fenofibrate micronized (LOFIBRA) 200 MG capsule daily before breakfast.   ferrous sulfate 325 (65 FE) MG tablet 1 tablet Orally Three times a Week   fluticasone  (FLONASE  SENSIMIST) 27.5 MCG/SPRAY nasal spray Place 2 sprays into the nose in the morning and at bedtime.   ibuprofen (ADVIL) 400 MG tablet as needed.   ipratropium-albuterol  (DUONEB) 0.5-2.5 (3) MG/3ML SOLN Take 3 mLs by nebulization every 6 (six) hours as needed (shortness of breath, wheezing).   isosorbide-hydrALAZINE  (BIDIL) 20-37.5 MG tablet Take 1 tablet by mouth 3 (three) times daily.   levothyroxine (SYNTHROID) 112 MCG tablet Take 224 mcg by mouth daily.   loratadine (CLARITIN) 10 MG tablet Take 10 mg by mouth daily.   losartan  (COZAAR ) 100 MG tablet Take 1 tablet (100 mg total) by mouth daily at 10 pm.   neomycin  (MYCIFRADIN ) 500 MG tablet Take 1 tablet (500 mg total) by mouth 2 (two) times daily.   rosuvastatin (CRESTOR) 40 MG tablet Take 40 mg by mouth daily.   TRESIBA FLEXTOUCH 200 UNIT/ML FlexTouch Pen Inject 50 Units into the skin in the morning and at bedtime.   triamcinolone  cream (KENALOG ) 0.1 % APPLY TO AFFECTED AREA TWICE A DAY   No current facility-administered medications on file prior to visit.   Medications Discontinued During This Encounter  Medication Reason   rifaximin  (XIFAXAN ) 550 MG TABS tablet Completed Course   Continuous Glucose Sensor (DEXCOM G6 SENSOR) MISC Reorder   Continuous Glucose Transmitter (DEXCOM G6 TRANSMITTER) MISC Reorder   diclofenac  Sodium (VOLTAREN ) 1 % GEL  Reorder   B-D UF III MINI PEN NEEDLES 31G X 5 MM MISC Reorder   Cobalamin Combinations (B-12) 1000-400 MCG SUBL Reorder   sertraline  (ZOLOFT ) 50 MG tablet Reorder   icosapent  Ethyl (VASCEPA ) 1 g capsule Reorder   Finerenone  (KERENDIA ) 20 MG TABS Reorder     Physical Exam:    12/22/2024   11:57 AM  11/05/2024    2:08 PM 10/12/2024    1:42 PM  Vitals with BMI  Height 6' 3  6' 3  Weight 236 lbs 6 oz  245 lbs 3 oz  BMI 29.55  30.65  Systolic 126 157 873  Diastolic 58 89 62  Pulse 83 70 70  Vital signs reviewed.  Nursing notes reviewed. Weight trend reviewed. Physical Activity: Sufficiently Active (10/10/2024)   Exercise Vital Sign    Days of Exercise per Week: 4 days    Minutes of Exercise per Session: 60 min   General Appearance:  No acute distress appreciable.   Well-groomed, healthy-appearing male.  Well proportioned with no abnormal fat distribution.  Good muscle tone. Pulmonary:  Normal work of breathing at rest, no respiratory distress apparent. SpO2: 97 %  Musculoskeletal: All extremities are intact.  Neurological:  Awake, alert, oriented, and engaged.  No obvious focal neurological deficits or cognitive impairments.  Sensorium seems unclouded.   Speech is clear and coherent with logical content. Psychiatric:  Appropriate mood, pleasant and cooperative demeanor, thoughtful and engaged during the exam   Verbalized to patient: Physical Exam CHEST: Abnormal lung sounds at the bases bilaterally.     12/22/2024   11:49 AM 01/25/2024    2:05 PM 08/20/2023    1:59 PM 06/22/2023    2:20 PM  PHQ 2/9 Scores  PHQ - 2 Score 0 0 0 1  PHQ- 9 Score    5      Data saved with a previous flowsheet row definition   Office Visit on 12/22/2024  Component Date Value Ref Range Status   WBC 12/22/2024 7.4  4.0 - 10.5 K/uL Final   RBC 12/22/2024 4.87  4.22 - 5.81 Mil/uL Final   Hemoglobin 12/22/2024 15.7  13.0 - 17.0 g/dL Final   HCT 97/94/7973 44.9  39.0 - 52.0 % Final   MCV 12/22/2024 92.2  78.0 - 100.0 fl Final   MCHC 12/22/2024 35.0  30.0 - 36.0 g/dL Final   RDW 97/94/7973 12.1  11.5 - 15.5 % Final   Platelets 12/22/2024 258.0  150.0 - 400.0 K/uL Final   Neutrophils Relative % 12/22/2024 65.9  43.0 - 77.0 % Final   Lymphocytes Relative 12/22/2024 23.6  12.0 - 46.0 % Final    Monocytes Relative 12/22/2024 7.0  3.0 - 12.0 % Final   Eosinophils Relative 12/22/2024 2.5  0.0 - 5.0 % Final   Basophils Relative 12/22/2024 1.0  0.0 - 3.0 % Final   Neutro Abs 12/22/2024 4.9  1.4 - 7.7 K/uL Final   Lymphs Abs 12/22/2024 1.8  0.7 - 4.0 K/uL Final   Monocytes Absolute 12/22/2024 0.5  0.1 - 1.0 K/uL Final   Eosinophils Absolute 12/22/2024 0.2  0.0 - 0.7 K/uL Final   Basophils Absolute 12/22/2024 0.1  0.0 - 0.1 K/uL Final   Sodium 12/22/2024 133 (L)  135 - 145 mEq/L Final   Potassium 12/22/2024 4.9  3.5 - 5.1 mEq/L Final   Chloride 12/22/2024 97  96 - 112 mEq/L Final   CO2 12/22/2024 28  19 - 32 mEq/L Final   Glucose, Bld 12/22/2024  447 (H)  70 - 99 mg/dL Final   BUN 97/94/7973 23  6 - 23 mg/dL Final   Creatinine, Ser 12/22/2024 1.83 (H)  0.40 - 1.50 mg/dL Final   Total Bilirubin 12/22/2024 0.5  0.2 - 1.2 mg/dL Final   Alkaline Phosphatase 12/22/2024 52  39 - 117 U/L Final   AST 12/22/2024 19  5 - 37 U/L Final   ALT 12/22/2024 21  3 - 53 U/L Final   Total Protein 12/22/2024 7.3  6.0 - 8.3 g/dL Final   Albumin 97/94/7973 4.5  3.5 - 5.2 g/dL Final   GFR 97/94/7973 38.44 (L)  >60.00 mL/min Final   Calcium  12/22/2024 10.0  8.4 - 10.5 mg/dL Final   Hgb J8r MFr Bld 12/22/2024 12.2 (H)  4.6 - 6.5 % Final   Microalb, Ur 12/22/2024 2.8 (H)  0.7 - 1.9 mg/dL Final   Creatinine,U 97/94/7973 51.4  mg/dL Final   Microalb Creat Ratio 12/22/2024 55.0 (H)  0.0 - 30.0 mg/g Final   Direct LDL 12/22/2024 98.0  mg/dL Final  Admission on 87/79/7974, Discharged on 11/05/2024  Component Date Value Ref Range Status   Influenza A Antigen, POC 11/05/2024 Negative  Negative Final   Influenza B Antigen, POC 11/05/2024 Negative  Negative Final   Covid Antigen, POC 11/05/2024 Negative  Negative Final  Office Visit on 10/12/2024  Component Date Value Ref Range Status   Color, Urine 10/12/2024 YELLOW  YELLOW Final   APPearance 10/12/2024 HAZY (A)  CLEAR Final   Specific Gravity, Urine 10/12/2024  1.031  1.001 - 1.035 Final   pH 10/12/2024 5.5  5.0 - 8.0 Final   Glucose, UA 10/12/2024 3+ (A)  NEGATIVE Final   Bilirubin Urine 10/12/2024 NEGATIVE  NEGATIVE Final   Ketones, ur 10/12/2024 NEGATIVE  NEGATIVE Final   Hgb urine dipstick 10/12/2024 NEGATIVE  NEGATIVE Final   Protein, ur 10/12/2024 NEGATIVE  NEGATIVE Final   Nitrites, Initial 10/12/2024 NEGATIVE  NEGATIVE Final   Leukocyte Esterase 10/12/2024 NEGATIVE  NEGATIVE Final   WBC, UA 10/12/2024 NONE SEEN  0 - 5 /HPF Final   RBC / HPF 10/12/2024 NONE SEEN  0 - 2 /HPF Final   Squamous Epithelial / HPF 10/12/2024 NONE SEEN  < OR = 5 /HPF Final   Bacteria, UA 10/12/2024 NONE SEEN  NONE SEEN /HPF Final   Hyaline Cast 10/12/2024 NONE SEEN  NONE SEEN /LPF Final   Note 10/12/2024    Final   Creatinine, Urine 10/12/2024 55  20 - 320 mg/dL Final   Protein/Creat Ratio 10/12/2024 NOTE  25 - 148 mg/g creat Final   Protein/Creatinine Ratio 10/12/2024 NOTE  0.025 - 0.148 mg/mg creat Final   Total Protein, Urine 10/12/2024 <4 (L)  5 - 25 mg/dL Final   Microalb, Ur 88/73/7974 0.9  0.0 - 1.9 mg/dL Final   Creatinine,U 88/73/7974 58.1  mg/dL Final   Microalb Creat Ratio 10/12/2024 14.9  0.0 - 30.0 mg/g Final   Reflexve Urine Culture 10/12/2024    Final  Office Visit on 07/20/2024  Component Date Value Ref Range Status   Sed Rate 07/20/2024 2  0 - 20 mm/h Final   C3 Complement 07/20/2024 168  82 - 185 mg/dL Final   C4 Complement 90/96/7974 31  15 - 53 mg/dL Final   Cyclic Citrullin Peptide Ab 90/96/7974 <16  UNITS Final   Total CK 07/20/2024 211  22 - 308 U/L Final  Office Visit on 07/07/2024  Component Date Value Ref Range Status  CYSTATIN C 07/07/2024 1.58 (H)  0.52 - 1.20 mg/L Final   eGFR 07/07/2024 42 (L)  >=60 mL/min/1.3m2 Final   Creatinine, Urine 07/07/2024 80  20 - 320 mg/dL Final   Protein/Creat Ratio 07/07/2024 163 (H)  25 - 148 mg/g creat Final   Protein/Creatinine Ratio 07/07/2024 0.163 (H)  0.025 - 0.148 mg/mg creat Final    Total Protein, Urine 07/07/2024 13  5 - 25 mg/dL Final   Sodium 91/78/7974 139  135 - 145 mEq/L Final   Potassium 07/07/2024 4.5  3.5 - 5.1 mEq/L Final   Chloride 07/07/2024 104  96 - 112 mEq/L Final   CO2 07/07/2024 26  19 - 32 mEq/L Final   Albumin 07/07/2024 4.7  3.5 - 5.2 g/dL Final   BUN 91/78/7974 23  6 - 23 mg/dL Final   Creatinine, Ser 07/07/2024 1.62 (H)  0.40 - 1.50 mg/dL Final   Glucose, Bld 91/78/7974 123 (H)  70 - 99 mg/dL Final   Phosphorus 91/78/7974 3.9  2.3 - 4.6 mg/dL Final   GFR 91/78/7974 44.64 (L)  >60.00 mL/min Final   Calcium  07/07/2024 9.8  8.4 - 10.5 mg/dL Final  Hospital Outpatient Visit on 07/05/2024  Component Date Value Ref Range Status   Genetic Analysis Overall Interpret* 07/05/2024 Negative   Final   Genetic Disease Assessed 07/05/2024    Final                   Value:This is a screening test and does not detect all pathogenic or likely pathogenic variant(s) in the tested genes; diagnostic testing is recommended for individuals with a personal or family history of heart disease or hereditary cancer. [employer]  Population Screen is a screening test that analyzes 11 genes related to hereditary breast and ovarian cancer (HBOC) syndrome, Lynch syndrome, and familial hypercholesterolemia. This test only reports clinically significant pathogenic and likely  pathogenic variants but does not report variants of uncertain significance (VUS). In addition, analysis of the PMS2 gene excludes exons 11-15, which overlap with a known pseudogene (PMS2CL).    Genetic Analysis Report 07/05/2024    Final                   Value:No pathogenic or likely pathogenic variants were detected in the genes analyzed by this test.Genetic test results should be interpreted in the context of an individual's personal medical and family history. Alteration to medical management is NOT  recommended based solely on this result. Clinical correlation is advised.Additional Considerations- This is  a screening test; individuals may still carry pathogenic or likely pathogenic variant(s) in the tested genes that are not detected by this test.-  For individuals at risk for these or other related conditions based on factors including personal or family history, diagnostic testing is recommended.- The absence of pathogenic or likely pathogenic variant(s) in the analyzed genes, while reassuring,  does not eliminate the possibility of a hereditary condition; there are other variants and genes associated with heart disease and hereditary cancer that are not included in this test.    Genes Tested 07/05/2024 See Notes   Final   Disclaimer 07/05/2024 See Notes   Final   Sequencing Location 07/05/2024 See Notes   Final   Interpretation Methods and Limitat* 07/05/2024 See Notes   Final  Lab on 06/16/2024  Component Date Value Ref Range Status   PSA 06/16/2024 0.78  0.10 - 4.00 ng/mL Final   TSH 06/16/2024 14.29 (H)  0.35 - 5.50 uIU/mL  Final   Cholesterol 06/16/2024 157  0 - 200 mg/dL Final   Triglycerides 92/68/7974 222.0 (H)  0.0 - 149.0 mg/dL Final   HDL 92/68/7974 61.50  >39.00 mg/dL Final   VLDL 92/68/7974 44.4 (H)  0.0 - 40.0 mg/dL Final   LDL Cholesterol 06/16/2024 51  0 - 99 mg/dL Final   Total CHOL/HDL Ratio 06/16/2024 3   Final   NonHDL 06/16/2024 95.86   Final   Sodium 06/16/2024 143  135 - 145 mEq/L Final   Potassium 06/16/2024 4.4  3.5 - 5.1 mEq/L Final   Chloride 06/16/2024 108  96 - 112 mEq/L Final   CO2 06/16/2024 26  19 - 32 mEq/L Final   Glucose, Bld 06/16/2024 115 (H)  70 - 99 mg/dL Final   BUN 92/68/7974 28 (H)  6 - 23 mg/dL Final   Creatinine, Ser 06/16/2024 1.73 (H)  0.40 - 1.50 mg/dL Final   Total Bilirubin 06/16/2024 0.4  0.2 - 1.2 mg/dL Final   Alkaline Phosphatase 06/16/2024 36 (L)  39 - 117 U/L Final   AST 06/16/2024 27  0 - 37 U/L Final   ALT 06/16/2024 23  0 - 53 U/L Final   Total Protein 06/16/2024 7.1  6.0 - 8.3 g/dL Final   Albumin 92/68/7974 4.6  3.5 - 5.2 g/dL  Final   GFR 92/68/7974 41.27 (L)  >60.00 mL/min Final   Calcium  06/16/2024 9.4  8.4 - 10.5 mg/dL Final   WBC 92/68/7974 4.0  4.0 - 10.5 K/uL Final   RBC 06/16/2024 4.34  4.22 - 5.81 Mil/uL Final   Hemoglobin 06/16/2024 13.7  13.0 - 17.0 g/dL Final   HCT 92/68/7974 41.9  39.0 - 52.0 % Final   MCV 06/16/2024 96.7  78.0 - 100.0 fl Final   MCHC 06/16/2024 32.6  30.0 - 36.0 g/dL Final   RDW 92/68/7974 13.9  11.5 - 15.5 % Final   Platelets 06/16/2024 201.0  150.0 - 400.0 K/uL Final   Neutrophils Relative % 06/16/2024 43.0  43.0 - 77.0 % Final   Lymphocytes Relative 06/16/2024 41.3  12.0 - 46.0 % Final   Monocytes Relative 06/16/2024 8.4  3.0 - 12.0 % Final   Eosinophils Relative 06/16/2024 6.5 (H)  0.0 - 5.0 % Final   Basophils Relative 06/16/2024 0.8  0.0 - 3.0 % Final   Neutro Abs 06/16/2024 1.7  1.4 - 7.7 K/uL Final   Lymphs Abs 06/16/2024 1.7  0.7 - 4.0 K/uL Final   Monocytes Absolute 06/16/2024 0.3  0.1 - 1.0 K/uL Final   Eosinophils Absolute 06/16/2024 0.3  0.0 - 0.7 K/uL Final   Basophils Absolute 06/16/2024 0.0  0.0 - 0.1 K/uL Final   Iron 06/16/2024 91  42 - 165 ug/dL Final   Transferrin 92/68/7974 289.0  212.0 - 360.0 mg/dL Final   Saturation Ratios 06/16/2024 22.5  20.0 - 50.0 % Final   TIBC 06/16/2024 404.6  250.0 - 450.0 mcg/dL Final   Vitamin A-87 92/68/7974 817  211 - 911 pg/mL Final   Folate 06/16/2024 11.7  >5.9 ng/mL Final  Lab on 05/06/2024  Component Date Value Ref Range Status   Vitamin B-12 05/06/2024 >1500 (H)  211 - 911 pg/mL Final   Folate 05/06/2024 9.5  >5.9 ng/mL Final   TSH 05/06/2024 0.33 (L)  0.35 - 5.50 uIU/mL Final   Cholesterol 05/06/2024 147  0 - 200 mg/dL Final   Triglycerides 93/79/7974 380.0 (H)  0.0 - 149.0 mg/dL Final   HDL 93/79/7974 36.10 (L)  >  39.00 mg/dL Final   VLDL 93/79/7974 76.0 (H)  0.0 - 40.0 mg/dL Final   LDL Cholesterol 05/06/2024 35  0 - 99 mg/dL Final   Total CHOL/HDL Ratio 05/06/2024 4   Final   NonHDL 05/06/2024 111.00   Final    Sodium 05/06/2024 138  135 - 145 mEq/L Final   Potassium 05/06/2024 4.4  3.5 - 5.1 mEq/L Final   Chloride 05/06/2024 105  96 - 112 mEq/L Final   CO2 05/06/2024 25  19 - 32 mEq/L Final   Glucose, Bld 05/06/2024 304 (H)  70 - 99 mg/dL Final   BUN 93/79/7974 17  6 - 23 mg/dL Final   Creatinine, Ser 05/06/2024 1.43  0.40 - 1.50 mg/dL Final   Total Bilirubin 05/06/2024 0.4  0.2 - 1.2 mg/dL Final   Alkaline Phosphatase 05/06/2024 33 (L)  39 - 117 U/L Final   AST 05/06/2024 36  0 - 37 U/L Final   ALT 05/06/2024 34  0 - 53 U/L Final   Total Protein 05/06/2024 6.6  6.0 - 8.3 g/dL Final   Albumin 93/79/7974 4.1  3.5 - 5.2 g/dL Final   GFR 93/79/7974 51.91 (L)  >60.00 mL/min Final   Calcium  05/06/2024 8.9  8.4 - 10.5 mg/dL Final   WBC 93/79/7974 3.9 (L)  4.0 - 10.5 K/uL Final   RBC 05/06/2024 4.16 (L)  4.22 - 5.81 Mil/uL Final   Hemoglobin 05/06/2024 13.1  13.0 - 17.0 g/dL Final   HCT 93/79/7974 38.6 (L)  39.0 - 52.0 % Final   MCV 05/06/2024 92.7  78.0 - 100.0 fl Final   MCHC 05/06/2024 33.9  30.0 - 36.0 g/dL Final   RDW 93/79/7974 13.3  11.5 - 15.5 % Final   Platelets 05/06/2024 189.0  150.0 - 400.0 K/uL Final   Neutrophils Relative % 05/06/2024 51.9  43.0 - 77.0 % Final   Lymphocytes Relative 05/06/2024 34.6  12.0 - 46.0 % Final   Monocytes Relative 05/06/2024 7.5  3.0 - 12.0 % Final   Eosinophils Relative 05/06/2024 5.0  0.0 - 5.0 % Final   Basophils Relative 05/06/2024 1.0  0.0 - 3.0 % Final   Neutro Abs 05/06/2024 2.0  1.4 - 7.7 K/uL Final   Lymphs Abs 05/06/2024 1.3  0.7 - 4.0 K/uL Final   Monocytes Absolute 05/06/2024 0.3  0.1 - 1.0 K/uL Final   Eosinophils Absolute 05/06/2024 0.2  0.0 - 0.7 K/uL Final   Basophils Absolute 05/06/2024 0.0  0.0 - 0.1 K/uL Final   Iron 05/06/2024 97  42 - 165 ug/dL Final   Transferrin 93/79/7974 243.0  212.0 - 360.0 mg/dL Final   Saturation Ratios 05/06/2024 28.5  20.0 - 50.0 % Final   Ferritin 05/06/2024 84.8  22.0 - 322.0 ng/mL Final   TIBC 05/06/2024  340.2  250.0 - 450.0 mcg/dL Final  Office Visit on 01/25/2024  Component Date Value Ref Range Status   Methylmalonic Acid, Quant 01/25/2024 165  69 - 390 nmol/L Final   Sed Rate 01/25/2024 15  0 - 20 mm/hr Final   CRP 01/25/2024 <1.0  0.5 - 20.0 mg/dL Final   Anti Nuclear Antibody (ANA) 01/25/2024 Negative  Negative Final   Rheumatoid fact SerPl-aCnc 01/25/2024 <10  <14 IU/mL Final   HLA-B27 Antigen 01/25/2024 NEGATIVE  NEGATIVE Final   INTERPRETATION 01/25/2024    Final   (tTG) Ab, IgA 01/25/2024 <1.0  U/mL Final   Immunoglobulin A 01/25/2024 149  70 - 320 mg/dL Final  Lab on 96/92/7974  Component Date Value Ref Range Status   PSA 01/22/2024 0.89  0.10 - 4.00 ng/mL Final   WBC 01/22/2024 5.1  4.0 - 10.5 K/uL Final   RBC 01/22/2024 4.76  4.22 - 5.81 Mil/uL Final   Hemoglobin 01/22/2024 14.4  13.0 - 17.0 g/dL Final   HCT 96/92/7974 42.9  39.0 - 52.0 % Final   MCV 01/22/2024 90.1  78.0 - 100.0 fl Final   MCHC 01/22/2024 33.5  30.0 - 36.0 g/dL Final   RDW 96/92/7974 14.5  11.5 - 15.5 % Final   Platelets 01/22/2024 251.0  150.0 - 400.0 K/uL Final   Neutrophils Relative % 01/22/2024 53.1  43.0 - 77.0 % Final   Lymphocytes Relative 01/22/2024 33.3  12.0 - 46.0 % Final   Monocytes Relative 01/22/2024 6.7  3.0 - 12.0 % Final   Eosinophils Relative 01/22/2024 5.7 (H)  0.0 - 5.0 % Final   Basophils Relative 01/22/2024 1.2  0.0 - 3.0 % Final   Neutro Abs 01/22/2024 2.7  1.4 - 7.7 K/uL Final   Lymphs Abs 01/22/2024 1.7  0.7 - 4.0 K/uL Final   Monocytes Absolute 01/22/2024 0.3  0.1 - 1.0 K/uL Final   Eosinophils Absolute 01/22/2024 0.3  0.0 - 0.7 K/uL Final   Basophils Absolute 01/22/2024 0.1  0.0 - 0.1 K/uL Final   Vitamin B-12 01/22/2024 >1537 (H)  211 - 911 pg/mL Final   Folate 01/22/2024 10.4  >5.9 ng/mL Final   Iron 01/22/2024 84  42 - 165 ug/dL Final   Transferrin 96/92/7974 338.0  212.0 - 360.0 mg/dL Final   Saturation Ratios 01/22/2024 17.8 (L)  20.0 - 50.0 % Final   Ferritin  01/22/2024 51.7  22.0 - 322.0 ng/mL Final   TIBC 01/22/2024 473.2 (H)  250.0 - 450.0 mcg/dL Final  There may be more visits with results that are not included.  No image results found. No results found.       ASSESSMENT & PLAN   Assessment & Plan Pneumonia due to infectious organism, unspecified laterality, unspecified part of lung COPD exacerbation (HCC) Suspected bacterial pneumonia with bronchitis, given worsening symptoms and lung sounds. Differential includes bacterial pneumonia, possibly Legionella or Bordetella pertussis, though pertussis is less likely due to recent tetanus vaccination. Symptoms include wheezing, fatigue, and dry eyes with headaches, possibly related to sinus involvement. No fever reported. Previous treatment with DuoNeb and prednisone  provided temporary relief. Current symptoms suggest a need for antibiotic intervention. Ordered chest x-ray to confirm pneumonia diagnosis. Prescribed azithromycin  (Z-Pak) for suspected bacterial pneumonia and prednisone  for potential air hunger, with instructions to use based on symptoms rather than oxygen saturation. Continue DuoNeb up to four times daily for symptom relief. Referred to pulmonologist as a backup plan if symptoms do not improve. Advised on hospital ER precautions, including monitoring oxygen saturation and seeking care if symptoms worsen.  Discussion on the use of probiotics post-antibiotic treatment to restore gut flora. Recommended probiotics after completion of azithromycin  course. Consider NAD and zinc supplements for additional health benefits. Apathetic Hyperlipidemia associated with type 2 diabetes mellitus (HCC) Other vitamin B12 deficiency anemia Will prescribe refills per patient request  Type 2 diabetes mellitus with stage 3a chronic kidney disease, with long-term current use of insulin (HCC) Blood sugars elevated, likely due to recent prednisone  use. Current management includes insulin adjustments. No recent  A1c available, but previous levels were well-controlled. Ordered full diabetes blood work to assess current status. Continue current insulin regimen with adjustments as needed. Acute maxillary sinusitis, recurrence  not specified Symptoms of dry eyes and frontal headaches suggest possible sinus involvement. Recommended sinus rinse at bedtime to alleviate symptoms. Resistant hypertension Blood pressure well-controlled with current medication regimen. Continue current antihypertensive medications.  ORDER ASSOCIATIONS  #   DIAGNOSIS / CONDITION ICD-10 ENCOUNTER ORDER     ICD-10-CM   1. Pneumonia due to infectious organism, unspecified laterality, unspecified part of lung  J18.9 Ambulatory referral to Pulmonology    DISCONTINUED: azithromycin  (ZITHROMAX ) 250 MG tablet    2. COPD exacerbation (HCC)  J44.1 DG Chest 2 View    Ambulatory referral to Pulmonology    DISCONTINUED: ipratropium-albuterol  (DUONEB) 0.5-2.5 (3) MG/3ML SOLN    DISCONTINUED: predniSONE  (DELTASONE ) 20 MG tablet    3. Apathetic  R45.3 sertraline  (ZOLOFT ) 50 MG tablet    4. Hyperlipidemia associated with type 2 diabetes mellitus (HCC)  E11.69 icosapent  Ethyl (VASCEPA ) 1 g capsule   E78.5     5. Type 2 diabetes mellitus with stage 3a chronic kidney disease, with long-term current use of insulin (HCC)  E11.22 Finerenone  (KERENDIA ) 20 MG TABS   N18.31 diclofenac  Sodium (VOLTAREN ) 1 % GEL   Z79.4 Continuous Glucose Transmitter (DEXCOM G6 TRANSMITTER) MISC    Continuous Glucose Sensor (DEXCOM G6 SENSOR) MISC    B-D UF III MINI PEN NEEDLES 31G X 5 MM MISC    CBC with Differential/Platelet    Comprehensive metabolic panel with GFR    Hemoglobin A1c    Microalbumin / creatinine urine ratio    Direct LDL    CANCELED: Lipid panel    6. Other vitamin B12 deficiency anemia  D51.8 Cobalamin Combinations (B-12) 1000-400 MCG SUBL           Orders Placed in Encounter:   Lab Orders         CBC with Differential/Platelet          Comprehensive metabolic panel with GFR         Hemoglobin A1c         Microalbumin / creatinine urine ratio         Direct LDL    Imaging Orders         DG Chest 2 View    Referral Orders         Ambulatory referral to Pulmonology    Meds ordered this encounter  Medications   sertraline  (ZOLOFT ) 50 MG tablet    Sig: Take 1 tablet (50 mg total) by mouth daily.    Dispense:  90 tablet    Refill:  3   icosapent  Ethyl (VASCEPA ) 1 g capsule    Sig: Take 2 capsules (2 g total) by mouth 2 (two) times daily.    Dispense:  180 capsule    Refill:  3   Finerenone  (KERENDIA ) 20 MG TABS    Sig: Take 1 tablet (20 mg total) by mouth daily.    Dispense:  90 tablet    Refill:  3    Medical necessity: For diabetic nephropathy with eGFR 58.44 and ACR 56mg /g. Per KDIGO guidelines for kidney protection in T2DM   diclofenac  Sodium (VOLTAREN ) 1 % GEL    Sig: USE AS DIRECTED    Dispense:  350 g    Refill:  0   Continuous Glucose Transmitter (DEXCOM G6 TRANSMITTER) MISC    Sig: USE AS DIRECTED    Dispense:  3 each    Refill:  3   Continuous Glucose Sensor (DEXCOM G6 SENSOR) MISC    Sig: USE AS DIRECTED. NEED  TO REPLACE EVERY 10 DAYS.    Dispense:  9 each    Refill:  3   Cobalamin Combinations (B-12) 1000-400 MCG SUBL    Sig: Place 1 tablet under the tongue daily.    Dispense:  90 tablet    Refill:  3   B-D UF III MINI PEN NEEDLES 31G X 5 MM MISC    Sig: USE AS DIRECTED    Dispense:  300 each    Refill:  1   DISCONTD: ipratropium-albuterol  (DUONEB) 0.5-2.5 (3) MG/3ML SOLN    Sig: Take 3 mLs by nebulization every 4 (four) hours as needed.    Dispense:  360 mL    Refill:  2   DISCONTD: azithromycin  (ZITHROMAX ) 250 MG tablet    Sig: Take 2 tablets on day 1, then 1 tablet daily on days 2 through 5    Dispense:  6 tablet    Refill:  0   DISCONTD: predniSONE  (DELTASONE ) 20 MG tablet    Sig: Take 2 pills for 3 days, 1 pill for 4 days    Dispense:  10 tablet    Refill:  0        This  document was synthesized by artificial intelligence ([employer]) using HIPAA-compliant recording of the clinical interaction;   We discussed the use of AI scribe software for clinical note transcription with the patient, who gave verbal consent to proceed. additional Info: This encounter employed state-of-the-art, real-time, collaborative documentation. The patient actively reviewed and assisted in updating their electronic medical record on a shared screen, ensuring transparency and facilitating joint problem-solving for the problem list, overview, and plan. This approach promotes accurate, informed care. The treatment plan was discussed and reviewed in detail, including medication safety, potential side effects, and all patient questions. We confirmed understanding and comfort with the plan. Follow-up instructions were established, including contacting the office for any concerns, returning if symptoms worsen, persist, or new symptoms develop, and precautions for potential emergency department visits.

## 2024-12-23 ENCOUNTER — Encounter: Payer: Self-pay | Admitting: Internal Medicine

## 2024-12-23 DIAGNOSIS — R7989 Other specified abnormal findings of blood chemistry: Secondary | ICD-10-CM

## 2024-12-23 NOTE — Telephone Encounter (Signed)
 Last read by [name]. at 3:37PM on 12/23/2024.

## 2025-01-23 ENCOUNTER — Ambulatory Visit (HOSPITAL_BASED_OUTPATIENT_CLINIC_OR_DEPARTMENT_OTHER): Admitting: Pulmonary Disease

## 2025-06-22 ENCOUNTER — Ambulatory Visit: Admitting: Internal Medicine
